# Patient Record
Sex: Female | Born: 1950
Health system: Southern US, Community
[De-identification: ages and names within clinical notes are randomized; demographics above are authoritative.]

## PROBLEM LIST (undated history)

## (undated) DIAGNOSIS — I10 Essential (primary) hypertension: Secondary | ICD-10-CM

## (undated) DIAGNOSIS — E785 Hyperlipidemia, unspecified: Secondary | ICD-10-CM

## (undated) DIAGNOSIS — I2510 Atherosclerotic heart disease of native coronary artery without angina pectoris: Secondary | ICD-10-CM

## (undated) HISTORY — DX: Hyperlipidemia, unspecified: E78.5

## (undated) HISTORY — DX: Atherosclerotic heart disease of native coronary artery without angina pectoris: I25.10

## (undated) HISTORY — PX: GALLBLADDER SURGERY: SHX652

---

## 1999-10-29 ENCOUNTER — Encounter: Admission: RE | Admit: 1999-10-29 | Discharge: 1999-10-29 | Payer: Self-pay | Admitting: *Deleted

## 1999-10-29 ENCOUNTER — Encounter: Payer: Self-pay | Admitting: *Deleted

## 2000-08-12 ENCOUNTER — Encounter: Admission: RE | Admit: 2000-08-12 | Discharge: 2000-08-12 | Payer: Self-pay | Admitting: Obstetrics and Gynecology

## 2000-08-12 ENCOUNTER — Encounter: Payer: Self-pay | Admitting: Obstetrics and Gynecology

## 2001-04-27 ENCOUNTER — Other Ambulatory Visit: Admission: RE | Admit: 2001-04-27 | Discharge: 2001-04-27 | Payer: Self-pay | Admitting: Obstetrics and Gynecology

## 2002-02-14 ENCOUNTER — Encounter: Payer: Self-pay | Admitting: Obstetrics and Gynecology

## 2002-02-14 ENCOUNTER — Encounter: Admission: RE | Admit: 2002-02-14 | Discharge: 2002-02-14 | Payer: Self-pay | Admitting: Obstetrics and Gynecology

## 2003-06-05 ENCOUNTER — Encounter: Admission: RE | Admit: 2003-06-05 | Discharge: 2003-06-05 | Payer: Self-pay | Admitting: Obstetrics and Gynecology

## 2003-06-05 ENCOUNTER — Encounter: Payer: Self-pay | Admitting: Obstetrics and Gynecology

## 2004-02-03 ENCOUNTER — Ambulatory Visit (HOSPITAL_COMMUNITY): Admission: RE | Admit: 2004-02-03 | Discharge: 2004-02-03 | Payer: Self-pay | Admitting: Gastroenterology

## 2004-10-16 ENCOUNTER — Encounter: Admission: RE | Admit: 2004-10-16 | Discharge: 2004-10-16 | Payer: Self-pay | Admitting: Obstetrics and Gynecology

## 2006-03-08 ENCOUNTER — Encounter: Admission: RE | Admit: 2006-03-08 | Discharge: 2006-03-08 | Payer: Self-pay | Admitting: Obstetrics and Gynecology

## 2007-04-14 ENCOUNTER — Encounter: Admission: RE | Admit: 2007-04-14 | Discharge: 2007-04-14 | Payer: Self-pay | Admitting: Obstetrics and Gynecology

## 2008-09-13 ENCOUNTER — Encounter: Admission: RE | Admit: 2008-09-13 | Discharge: 2008-09-13 | Payer: Self-pay | Admitting: Obstetrics and Gynecology

## 2011-01-13 ENCOUNTER — Other Ambulatory Visit: Payer: Self-pay | Admitting: Internal Medicine

## 2011-01-13 DIAGNOSIS — Z1231 Encounter for screening mammogram for malignant neoplasm of breast: Secondary | ICD-10-CM

## 2011-01-15 ENCOUNTER — Ambulatory Visit
Admission: RE | Admit: 2011-01-15 | Discharge: 2011-01-15 | Disposition: A | Discharge status: Home / Self Care | Payer: BC Managed Care – PPO | Source: Ambulatory Visit | Attending: Internal Medicine | Admitting: Internal Medicine

## 2011-01-15 DIAGNOSIS — Z1231 Encounter for screening mammogram for malignant neoplasm of breast: Secondary | ICD-10-CM

## 2011-02-19 NOTE — Op Note (Signed)
[name], [name]                          ACCOUNT NO.:  [id number]   MEDICAL RECORD NO.:  [id number]                   PATIENT TYPE:  AMB   LOCATION:  ENDO                                 FACILITY:  [facility]   PHYSICIAN:  Danise Edge, M.D.                DATE OF BIRTH:  09-Mar-1951   DATE OF PROCEDURE:  02/03/2004  DATE OF DISCHARGE:                                 OPERATIVE REPORT   PROCEDURE:  Screening colonoscopy.   INDICATIONS:  Mrs. Amy Kerr is a 60 year old female, born January 25, 1951.  [name] is scheduled to undergo her first screening colonoscopy  with polypectomy to prevent colon cancer.   ENDOSCOPIST:  Danise Edge, M.D.   PREMEDICATION:  Demerol 60 mg, Versed 8.5 mg.   DESCRIPTION OF PROCEDURE:  After obtaining informed consent, [name] was  placed in the left lateral decubitus position.  I administered intravenous  Demerol and intravenous Versed to achieve conscious sedation for the  procedure.  The patient's blood pressure, oxygen saturation and cardiac  rhythm were monitored throughout the procedure and documented in the medical  record.   Anal inspection and digital rectal exam were normal.  The Olympus adjustable  pediatric colonoscope was introduced into the rectum and advanced to the  cecum.  Colonic preparation for the exam today was excellent.   Rectum:  Normal.  Sigmoid colon and descending colon:  Normal.  Splenic flexure:  Normal.  Transverse colon:  Normal.  Hepatic flexure:  Normal.  Ascending colon:  Normal.  Cecum and ileocecal valve:  Normal.   ASSESSMENT:  Normal screening proctocolonoscopy to the cecum.  No endoscopic  evidence for the presence of colorectal  neoplasia.                                               Danise Edge, M.D.    MJ/MEDQ  D:  02/03/2004  T:  02/03/2004  Job:  [id number]   cc:   Georgann Housekeeper, M.D.  [address]  [place]  [place] [address]  Fax: [phone]

## 2012-08-18 ENCOUNTER — Other Ambulatory Visit: Payer: Self-pay | Admitting: Internal Medicine

## 2012-08-18 DIAGNOSIS — Z1231 Encounter for screening mammogram for malignant neoplasm of breast: Secondary | ICD-10-CM

## 2012-09-29 ENCOUNTER — Ambulatory Visit
Admission: RE | Admit: 2012-09-29 | Discharge: 2012-09-29 | Disposition: A | Discharge status: Home / Self Care | Payer: BC Managed Care – PPO | Source: Ambulatory Visit | Attending: Internal Medicine | Admitting: Internal Medicine

## 2012-09-29 DIAGNOSIS — Z1231 Encounter for screening mammogram for malignant neoplasm of breast: Secondary | ICD-10-CM

## 2013-03-22 ENCOUNTER — Encounter (HOSPITAL_COMMUNITY): Payer: Self-pay | Admitting: Certified Registered Nurse Anesthetist

## 2013-03-22 ENCOUNTER — Encounter (HOSPITAL_COMMUNITY)
Admission: EM | Disposition: A | Discharge status: Home / Self Care | Payer: Self-pay | Source: Home / Self Care | Attending: Emergency Medicine

## 2013-03-22 ENCOUNTER — Emergency Department (HOSPITAL_COMMUNITY): Payer: BC Managed Care – PPO | Admitting: Certified Registered Nurse Anesthetist

## 2013-03-22 ENCOUNTER — Encounter (HOSPITAL_COMMUNITY): Payer: Self-pay | Admitting: Emergency Medicine

## 2013-03-22 ENCOUNTER — Observation Stay (HOSPITAL_COMMUNITY)
Admission: EM | Admit: 2013-03-22 | Discharge: 2013-03-23 | Disposition: A | Discharge status: Home / Self Care | Payer: BC Managed Care – PPO | Attending: General Surgery | Admitting: General Surgery

## 2013-03-22 ENCOUNTER — Emergency Department (HOSPITAL_COMMUNITY): Payer: BC Managed Care – PPO

## 2013-03-22 DIAGNOSIS — R1011 Right upper quadrant pain: Secondary | ICD-10-CM | POA: Insufficient documentation

## 2013-03-22 DIAGNOSIS — I10 Essential (primary) hypertension: Secondary | ICD-10-CM | POA: Insufficient documentation

## 2013-03-22 DIAGNOSIS — K35891 Other acute appendicitis without perforation, with gangrene: Secondary | ICD-10-CM | POA: Diagnosis present

## 2013-03-22 DIAGNOSIS — K3580 Unspecified acute appendicitis: Secondary | ICD-10-CM

## 2013-03-22 HISTORY — DX: Essential (primary) hypertension: I10

## 2013-03-22 HISTORY — PX: LAPAROSCOPIC APPENDECTOMY: SHX408

## 2013-03-22 LAB — CBC WITH DIFFERENTIAL/PLATELET
Basophils Absolute: 0 10*3/uL (ref 0.0–0.1)
Eosinophils Relative: 1 % (ref 0–5)
Lymphocytes Relative: 12 % (ref 12–46)
Lymphs Abs: 1.4 10*3/uL (ref 0.7–4.0)
MCV: 87.4 fL (ref 78.0–100.0)
Neutrophils Relative %: 82 % — ABNORMAL HIGH (ref 43–77)
Platelets: 260 10*3/uL (ref 150–400)
RBC: 4.85 MIL/uL (ref 3.87–5.11)
RDW: 12.8 % (ref 11.5–15.5)
WBC: 12.2 10*3/uL — ABNORMAL HIGH (ref 4.0–10.5)

## 2013-03-22 LAB — URINE MICROSCOPIC-ADD ON

## 2013-03-22 LAB — COMPREHENSIVE METABOLIC PANEL
ALT: 28 U/L (ref 0–35)
AST: 19 U/L (ref 0–37)
Alkaline Phosphatase: 82 U/L (ref 39–117)
CO2: 22 mEq/L (ref 19–32)
Calcium: 9 mg/dL (ref 8.4–10.5)
GFR calc non Af Amer: 62 mL/min — ABNORMAL LOW (ref >90)
Glucose, Bld: 120 mg/dL — ABNORMAL HIGH (ref 70–99)
Potassium: 3.7 mEq/L (ref 3.5–5.1)
Sodium: 138 mEq/L (ref 135–145)
Total Protein: 8 g/dL (ref 6.0–8.3)

## 2013-03-22 LAB — URINALYSIS, ROUTINE W REFLEX MICROSCOPIC
Bilirubin Urine: NEGATIVE
Glucose, UA: NEGATIVE mg/dL
Hgb urine dipstick: NEGATIVE
Specific Gravity, Urine: 1.017 (ref 1.005–1.030)

## 2013-03-22 SURGERY — APPENDECTOMY, LAPAROSCOPIC
Anesthesia: General | Site: Abdomen | Wound class: Contaminated

## 2013-03-22 MED ORDER — PROPOFOL 10 MG/ML IV BOLUS
INTRAVENOUS | Status: DC | PRN
  Administered 2013-03-22: 200 mg via INTRAVENOUS

## 2013-03-22 MED ORDER — NEOSTIGMINE METHYLSULFATE 1 MG/ML IJ SOLN
INTRAMUSCULAR | Status: DC | PRN
  Administered 2013-03-22: 4 mg via INTRAVENOUS

## 2013-03-22 MED ORDER — ONDANSETRON HCL 4 MG/2ML IJ SOLN
4.0000 mg | Freq: Once | INTRAMUSCULAR | Status: AC
  Administered 2013-03-22: 4 mg via INTRAVENOUS
  Filled 2013-03-22: qty 2

## 2013-03-22 MED ORDER — LACTATED RINGERS IV SOLN
INTRAVENOUS | Status: DC | PRN
  Administered 2013-03-22 (×2): via INTRAVENOUS

## 2013-03-22 MED ORDER — GLYCOPYRROLATE 0.2 MG/ML IJ SOLN
INTRAMUSCULAR | Status: DC | PRN
  Administered 2013-03-22: .5 mg via INTRAVENOUS

## 2013-03-22 MED ORDER — METOCLOPRAMIDE HCL 5 MG/ML IJ SOLN
10.0000 mg | Freq: Once | INTRAMUSCULAR | Status: AC
  Administered 2013-03-22: 10 mg via INTRAVENOUS
  Filled 2013-03-22: qty 2

## 2013-03-22 MED ORDER — BUPIVACAINE-EPINEPHRINE PF 0.25-1:200000 % IJ SOLN
INTRAMUSCULAR | Status: AC
  Filled 2013-03-22: qty 30

## 2013-03-22 MED ORDER — EPHEDRINE SULFATE 50 MG/ML IJ SOLN
INTRAMUSCULAR | Status: DC | PRN
  Administered 2013-03-22 (×2): 5 mg via INTRAVENOUS

## 2013-03-22 MED ORDER — MIDAZOLAM HCL 5 MG/5ML IJ SOLN
INTRAMUSCULAR | Status: DC | PRN
  Administered 2013-03-22: 2 mg via INTRAVENOUS

## 2013-03-22 MED ORDER — IOHEXOL 300 MG/ML  SOLN
25.0000 mL | INTRAMUSCULAR | Status: AC
  Administered 2013-03-22 (×2): 25 mL via ORAL

## 2013-03-22 MED ORDER — SODIUM CHLORIDE 0.9 % IR SOLN
Status: DC | PRN
  Administered 2013-03-22: 1

## 2013-03-22 MED ORDER — MORPHINE SULFATE 4 MG/ML IJ SOLN
6.0000 mg | Freq: Once | INTRAMUSCULAR | Status: AC
  Administered 2013-03-22: 6 mg via INTRAVENOUS
  Filled 2013-03-22: qty 2

## 2013-03-22 MED ORDER — LIDOCAINE HCL 4 % MT SOLN
OROMUCOSAL | Status: DC | PRN
  Administered 2013-03-22: 4 mL via TOPICAL

## 2013-03-22 MED ORDER — PHENYLEPHRINE HCL 10 MG/ML IJ SOLN
INTRAMUSCULAR | Status: DC | PRN
  Administered 2013-03-22 (×2): 100 ug via INTRAVENOUS

## 2013-03-22 MED ORDER — FENTANYL CITRATE 0.05 MG/ML IJ SOLN
INTRAMUSCULAR | Status: DC | PRN
  Administered 2013-03-22: 50 ug via INTRAVENOUS
  Administered 2013-03-22: 100 ug via INTRAVENOUS
  Administered 2013-03-22 (×2): 50 ug via INTRAVENOUS

## 2013-03-22 MED ORDER — DEXAMETHASONE SODIUM PHOSPHATE 4 MG/ML IJ SOLN
INTRAMUSCULAR | Status: DC | PRN
  Administered 2013-03-22: 8 mg via INTRAVENOUS

## 2013-03-22 MED ORDER — ROCURONIUM BROMIDE 100 MG/10ML IV SOLN
INTRAVENOUS | Status: DC | PRN
  Administered 2013-03-22 (×2): 25 mg via INTRAVENOUS

## 2013-03-22 MED ORDER — SODIUM CHLORIDE 0.9 % IR SOLN
Status: DC | PRN
  Administered 2013-03-22: 1000 mL

## 2013-03-22 MED ORDER — HYDROMORPHONE HCL PF 1 MG/ML IJ SOLN
0.5000 mg | INTRAMUSCULAR | Status: DC | PRN
  Administered 2013-03-22: 1 mg via INTRAVENOUS
  Filled 2013-03-22: qty 1

## 2013-03-22 MED ORDER — BUPIVACAINE-EPINEPHRINE 0.25% -1:200000 IJ SOLN
INTRAMUSCULAR | Status: DC | PRN
  Administered 2013-03-22: 30 mL

## 2013-03-22 MED ORDER — LIDOCAINE HCL (CARDIAC) 20 MG/ML IV SOLN
INTRAVENOUS | Status: DC | PRN
  Administered 2013-03-22: 100 mg via INTRAVENOUS

## 2013-03-22 MED ORDER — IOHEXOL 300 MG/ML  SOLN
100.0000 mL | Freq: Once | INTRAMUSCULAR | Status: AC | PRN
  Administered 2013-03-22: 100 mL via INTRAVENOUS

## 2013-03-22 MED ORDER — SODIUM CHLORIDE 0.9 % IV SOLN
3.0000 g | INTRAVENOUS | Status: AC
  Administered 2013-03-22: 3 g via INTRAVENOUS
  Filled 2013-03-22: qty 3

## 2013-03-22 MED ORDER — SUCCINYLCHOLINE CHLORIDE 20 MG/ML IJ SOLN
INTRAMUSCULAR | Status: DC | PRN
  Administered 2013-03-22: 100 mg via INTRAVENOUS

## 2013-03-22 SURGICAL SUPPLY — 49 items
ADH SKN CLS APL DERMABOND .7 (GAUZE/BANDAGES/DRESSINGS) ×1
APPLIER CLIP ROT 10 11.4 M/L (STAPLE)
APR CLP MED LRG 11.4X10 (STAPLE)
BAG SPEC RTRVL LRG 6X4 10 (ENDOMECHANICALS) ×1
BLADE SURG ROTATE 9660 (MISCELLANEOUS) ×1 IMPLANT
CANISTER SUCTION 2500CC (MISCELLANEOUS) ×2 IMPLANT
CHLORAPREP W/TINT 26ML (MISCELLANEOUS) ×3 IMPLANT
CLIP APPLIE ROT 10 11.4 M/L (STAPLE) IMPLANT
CLOTH BEACON ORANGE TIMEOUT ST (SAFETY) ×2 IMPLANT
COVER SURGICAL LIGHT HANDLE (MISCELLANEOUS) ×2 IMPLANT
CUTTER LINEAR ENDO 35 ETS (STAPLE) ×1 IMPLANT
CUTTER LINEAR ENDO 35 ETS TH (STAPLE) IMPLANT
DECANTER SPIKE VIAL GLASS SM (MISCELLANEOUS) ×1 IMPLANT
DERMABOND ADVANCED (GAUZE/BANDAGES/DRESSINGS) ×1
DERMABOND ADVANCED .7 DNX12 (GAUZE/BANDAGES/DRESSINGS) ×1 IMPLANT
DRAPE UTILITY 15X26 W/TAPE STR (DRAPE) ×4 IMPLANT
ELECT REM PT RETURN 9FT ADLT (ELECTROSURGICAL) ×2
ELECTRODE REM PT RTRN 9FT ADLT (ELECTROSURGICAL) ×1 IMPLANT
ENDOLOOP SUT PDS II  0 18 (SUTURE)
ENDOLOOP SUT PDS II 0 18 (SUTURE) IMPLANT
GLOVE BIOGEL PI IND STRL 7.5 (GLOVE) IMPLANT
GLOVE BIOGEL PI IND STRL 8 (GLOVE) ×1 IMPLANT
GLOVE BIOGEL PI INDICATOR 7.5 (GLOVE) ×1
GLOVE BIOGEL PI INDICATOR 8 (GLOVE) ×1
GLOVE ECLIPSE 7.5 STRL STRAW (GLOVE) ×2 IMPLANT
GLOVE SURG SS PI 7.5 STRL IVOR (GLOVE) ×1 IMPLANT
GOWN PREVENTION PLUS XXLARGE (GOWN DISPOSABLE) ×1 IMPLANT
GOWN STRL NON-REIN LRG LVL3 (GOWN DISPOSABLE) ×4 IMPLANT
GOWN STRL REIN XL XLG (GOWN DISPOSABLE) ×1 IMPLANT
KIT BASIN OR (CUSTOM PROCEDURE TRAY) ×2 IMPLANT
KIT ROOM TURNOVER OR (KITS) ×2 IMPLANT
NS IRRIG 1000ML POUR BTL (IV SOLUTION) ×2 IMPLANT
PAD ARMBOARD 7.5X6 YLW CONV (MISCELLANEOUS) ×4 IMPLANT
PENCIL BUTTON HOLSTER BLD 10FT (ELECTRODE) IMPLANT
POUCH SPECIMEN RETRIEVAL 10MM (ENDOMECHANICALS) ×2 IMPLANT
RELOAD /EVU35 (ENDOMECHANICALS) IMPLANT
RELOAD CUTTER ETS 35MM STAND (ENDOMECHANICALS) IMPLANT
SCALPEL HARMONIC ACE (MISCELLANEOUS) ×1 IMPLANT
SET IRRIG TUBING LAPAROSCOPIC (IRRIGATION / IRRIGATOR) ×2 IMPLANT
SPECIMEN JAR SMALL (MISCELLANEOUS) ×2 IMPLANT
SUT MNCRL AB 4-0 PS2 18 (SUTURE) ×2 IMPLANT
TOWEL OR 17X24 6PK STRL BLUE (TOWEL DISPOSABLE) ×2 IMPLANT
TOWEL OR 17X26 10 PK STRL BLUE (TOWEL DISPOSABLE) ×2 IMPLANT
TRAY FOLEY CATH 14FR (SET/KITS/TRAYS/PACK) ×2 IMPLANT
TRAY LAPAROSCOPIC (CUSTOM PROCEDURE TRAY) ×2 IMPLANT
TROCAR XCEL 12X100 BLDLESS (ENDOMECHANICALS) ×2 IMPLANT
TROCAR XCEL BLUNT TIP 100MML (ENDOMECHANICALS) ×2 IMPLANT
TROCAR XCEL NON-BLD 5MMX100MML (ENDOMECHANICALS) ×2 IMPLANT
WATER STERILE IRR 1000ML POUR (IV SOLUTION) IMPLANT

## 2013-03-22 NOTE — ED Notes (Signed)
Rt sided abd pain that came on suddenly this am  Denies dysuria  Has nausea. Last bm this am good

## 2013-03-22 NOTE — H&P (Signed)
Amy Kerr is an 62 y.o. female.   Chief Complaint: Abdominal pain HPI: Started having abdominal pain about 10 AM today associated with nausea and anorexia.  Had a normal bowel movement today which did not improve her pain.  Came to ED later in the day because her pain did not improve.  CT done showed likely early acute appendicitis.  Surgery consultation requested.  Past Medical History  Diagnosis Date  . Hypertension     No past surgical history on file.  No family history on file. Social History:  has no tobacco, alcohol, and drug history on file.  Allergies: No Known Allergies   (Not in a hospital admission)  Results for orders placed during the hospital encounter of 03/22/13 (from the past 48 hour(s))  CBC WITH DIFFERENTIAL     Status: Abnormal   Collection Time    03/22/13  2:30 PM      Result Value Range   WBC 12.2 (*) 4.0 - 10.5 K/uL   RBC 4.85  3.87 - 5.11 MIL/uL   Hemoglobin 14.6  12.0 - 15.0 g/dL   HCT 16.1  09.6 - 04.5 %   MCV 87.4  78.0 - 100.0 fL   MCH 30.1  26.0 - 34.0 pg   MCHC 34.4  30.0 - 36.0 g/dL   RDW 40.9  81.1 - 91.4 %   Platelets 260  150 - 400 K/uL   Neutrophils Relative % 82 (*) 43 - 77 %   Neutro Abs 9.9 (*) 1.7 - 7.7 K/uL   Lymphocytes Relative 12  12 - 46 %   Lymphs Abs 1.4  0.7 - 4.0 K/uL   Monocytes Relative 5  3 - 12 %   Monocytes Absolute 0.7  0.1 - 1.0 K/uL   Eosinophils Relative 1  0 - 5 %   Eosinophils Absolute 0.1  0.0 - 0.7 K/uL   Basophils Relative 0  0 - 1 %   Basophils Absolute 0.0  0.0 - 0.1 K/uL  COMPREHENSIVE METABOLIC PANEL     Status: Abnormal   Collection Time    03/22/13  2:30 PM      Result Value Range   Sodium 138  135 - 145 mEq/L   Potassium 3.7  3.5 - 5.1 mEq/L   Chloride 103  96 - 112 mEq/L   CO2 22  19 - 32 mEq/L   Glucose, Bld 120 (*) 70 - 99 mg/dL   BUN 12  6 - 23 mg/dL   Creatinine, Ser 7.82  0.50 - 1.10 mg/dL   Calcium 9.0  8.4 - 95.6 mg/dL   Total Protein 8.0  6.0 - 8.3 g/dL   Albumin 4.3  3.5 - 5.2  g/dL   AST 19  0 - 37 U/L   ALT 28  0 - 35 U/L   Alkaline Phosphatase 82  39 - 117 U/L   Total Bilirubin 0.7  0.3 - 1.2 mg/dL   GFR calc non Af Amer 62 (*) >90 mL/min   GFR calc Af Amer 72 (*) >90 mL/min   Comment:            The eGFR has been calculated     using the CKD EPI equation.     This calculation has not been     validated in all clinical     situations.     eGFR's persistently     <90 mL/min signify     possible Chronic Kidney Disease.  LIPASE, BLOOD  Status: None   Collection Time    03/22/13  2:30 PM      Result Value Range   Lipase 24  11 - 59 U/L  URINALYSIS, ROUTINE W REFLEX MICROSCOPIC     Status: Abnormal   Collection Time    03/22/13  2:30 PM      Result Value Range   Color, Urine YELLOW  YELLOW   APPearance CLEAR  CLEAR   Specific Gravity, Urine 1.017  1.005 - 1.030   pH 5.0  5.0 - 8.0   Glucose, UA NEGATIVE  NEGATIVE mg/dL   Hgb urine dipstick NEGATIVE  NEGATIVE   Bilirubin Urine NEGATIVE  NEGATIVE   Ketones, ur NEGATIVE  NEGATIVE mg/dL   Protein, ur NEGATIVE  NEGATIVE mg/dL   Urobilinogen, UA 0.2  0.0 - 1.0 mg/dL   Nitrite NEGATIVE  NEGATIVE   Leukocytes, UA TRACE (*) NEGATIVE  URINE MICROSCOPIC-ADD ON     Status: Abnormal   Collection Time    03/22/13  2:30 PM      Result Value Range   Squamous Epithelial / LPF RARE  RARE   WBC, UA 0-2  <3 WBC/hpf   RBC / HPF 0-2  <3 RBC/hpf   Bacteria, UA FEW (*) RARE   Casts HYALINE CASTS (*) NEGATIVE   Urine-Other MUCOUS PRESENT     Comment: LESS THAN 10 mL OF URINE SUBMITTED  OCCULT BLOOD, POC DEVICE     Status: None   Collection Time    03/22/13  3:51 PM      Result Value Range   Fecal Occult Bld NEGATIVE  NEGATIVE   Ct Abdomen Pelvis W Contrast  03/22/2013   *RADIOLOGY REPORT*  Clinical Data: Abdominal pain  CT ABDOMEN AND PELVIS WITH CONTRAST  Technique:  Multidetector CT imaging of the abdomen and pelvis was performed following the standard protocol during bolus administration of intravenous  contrast.  Contrast: OMNIPAQUE IOHEXOL 300 MG/ML  SOLN  Comparison: None.  Findings: There is streaky atelectasis at both lung bases, in the lower lobes.  Visualized portion of the heart is normal in size.  There is mild diffuse fatty infiltration of the liver.  No focal hepatic lesion or biliary ductal dilatation.  The gallbladder, pancreas, common bile duct, spleen, adrenal glands, are within normal limits.  Suggestion of slight cortical atrophy of the right kidney compared to the left.  There are benign parapelvic cysts in the left kidney.  Negative for hydronephrosis.  Both ureters are normal in caliber.  The urinary bladder, uterus, and adnexa are within normal limits.  Stomach is decompressed and unremarkable.  Small bowel loops normal in caliber.  Moderate diverticulosis of the colon, predominately in the sigmoid region, with preserved normal pericolonic fat.  The cecum is positioned in the right lower quadrant near the level of the iliac crest.  The appendix courses inferiorly and anteriorly from the cecum and measures up to 8.3 mm in diameter and the wall appears thickened.  No contrast or gas is seen within the lumen of the appendix.  There is some periappendiceal fat stranding (image #[id number]).  Negative for ascites, abscess, or free air.  Abdominal aorta is normal in caliber, and contains scattered atherosclerotic calcification.  Vertebral bodies normal in height and alignment.  Moderate sclerosis about the right sacroiliac joint, with associated bony erosion noted.  Mild sclerosis of the left sacroiliac joint.  IMPRESSION:  1.  CT findings suggestive of early acute appendicitis.  Findings were discussed with Greta Doom  Tram, PA, by telephone 03/22/2013 at 6:25 p.m. Reportedly, the patient does have right lower quadrant pain. 2.  Mild diffuse fatty infiltration of the liver. 3.  Colonic diverticulosis, uncomplicated. 4.  Bilateral sacroiliitis, right worse than left.   Original Report Authenticated By: Britta Mccreedy, M.D.    Review of Systems  Constitutional: Negative for fever and chills.  HENT: Negative.   Eyes: Negative.   Respiratory: Negative.   Cardiovascular: Negative.   Gastrointestinal: Positive for nausea and abdominal pain.  Genitourinary: Negative.   Musculoskeletal: Negative.   Skin: Negative.   Neurological: Negative.   Endo/Heme/Allergies: Negative.   Psychiatric/Behavioral: Negative.     Blood pressure 168/87, pulse 94, temperature 98.1 F (36.7 C), resp. rate 16, height 5\' 4"  (1.626 m), weight 81.647 kg (180 lb), SpO2 98.00%. Physical Exam  Constitutional: She is oriented to person, place, and time. She appears well-developed and well-nourished.  Obese  HENT:  Head: Normocephalic and atraumatic.  Eyes: Conjunctivae and EOM are normal. Pupils are equal, round, and reactive to light.  Neck: Normal carotid pulses present. Carotid bruit is not present.  Cardiovascular: Normal rate, regular rhythm and normal heart sounds.   Pulses:      Carotid pulses are 2+ on the right side, and 2+ on the left side.      Radial pulses are 2+ on the right side, and 2+ on the left side.       Femoral pulses are 2+ on the right side, and 2+ on the left side.      Popliteal pulses are 2+ on the right side, and 2+ on the left side.       Dorsalis pedis pulses are 2+ on the right side, and 2+ on the left side.       Posterior tibial pulses are 2+ on the right side, and 2+ on the left side.  Respiratory: Effort normal and breath sounds normal.  GI: Soft. Normal appearance. There is tenderness in the right lower quadrant. There is rebound, guarding and tenderness at McBurney's point.  Musculoskeletal: Normal range of motion.  Neurological: She is alert and oriented to person, place, and time. She has normal reflexes.  Skin: Skin is warm. She is diaphoretic.  Psychiatric: She has a normal mood and affect. Her behavior is normal. Judgment and thought content normal.      Assessment/Plan Acute appendicitis based on clinical evaluation and radiological studies.  Unasyn on call to the OR. Does not appear to be perforated. Laparoscopic appendectomy.  Cherylynn Ridges 03/22/2013, 8:07 PM

## 2013-03-22 NOTE — ED Provider Notes (Signed)
History     CSN: [id number]  Arrival date & time 03/22/13  1412   First MD Initiated Contact with Patient 03/22/13 1524      Chief Complaint  Patient presents with  . Abdominal Pain    (Consider location/radiation/quality/duration/timing/severity/associated sxs/prior treatment) HPI  62 year old female with history of hypertension presents complaining of abdominal pain. Patient reports acute onset of pain to the right lower quadrant abdomen which started about 4 hours ago. Pain is progressively worse, nonradiating, worsening with movement. Does associate nausea without vomiting or diarrhea. No complaint of fever, chills, chest pain, shortness of breath, back pain, dysuria, hematuria, hematochezia, or melena. No urinary complaints. Denies any recent trauma. No specific treatment tried. No prior abdominal surgery. Pain is currently rated as an 8/10, 10/10 with movement or palpation.  Appendix is intact.  Last BM this AM, normal.    Past Medical History  Diagnosis Date  . Hypertension     No past surgical history on file.  No family history on file.  History  Substance Use Topics  . Smoking status: Not on file  . Smokeless tobacco: Not on file  . Alcohol Use: Not on file    OB History   Grav Para Term Preterm Abortions TAB SAB Ect Mult Living                  Review of Systems  All other systems reviewed and are negative.    Allergies  Review of patient's allergies indicates no known allergies.  Home Medications  No current outpatient prescriptions on file.  BP 191/92  Pulse 85  Temp(Src) 98.1 F (36.7 C)  Resp 16  SpO2 99%  Physical Exam  Nursing note and vitals reviewed. Constitutional: She is oriented to person, place, and time. She appears well-developed and well-nourished. No distress.  Awake, alert, nontoxic appearance  HENT:  Head: Atraumatic.  Eyes: Conjunctivae are normal. Right eye exhibits no discharge. Left eye exhibits no discharge.  Neck:  Neck supple.  Cardiovascular: Normal rate and regular rhythm.   Pulmonary/Chest: Effort normal. No respiratory distress. She exhibits no tenderness.  Abdominal: Soft. There is tenderness (Right lower quadrant tenderness on palpation with guarding. Positive Rovsing sign, Negative Murphy's sign, positive McBurney's point.  Positive psoas sign.  no hernia.  no rebound tenderness). There is guarding. There is no rebound.  Genitourinary:  Chaperone present:  Normal rectal tone, no mass, no frank blood, hemoccult neg.    No CVA tenderness  Musculoskeletal: She exhibits no tenderness.  ROM appears intact, no obvious focal weakness  Neurological: She is alert and oriented to person, place, and time.  Mental status and motor strength appears intact  Skin: No rash noted.  Psychiatric: She has a normal mood and affect.    ED Course  Procedures (including critical care time)  Pt with RLQ abd pain concerning for appendicitis.  Work up initiated.  DDx appendicitis, colitis, diverticulitis, abd abscess, uti, kidney stone, MSK.    7:07 PM Pt has elevated WBC of 12.2, CT shows early appendicitis without rupture or abscess.  I have consulted General Surgery Dr. Lindie Spruce, who agrees to see pt in ER and will admit for further care  Pt made NPO.  Pain medication offered, pt declined.  Pt is afebrile, hemodynamically stable.  Care discussed with attending.    Labs Reviewed  CBC WITH DIFFERENTIAL - Abnormal; Notable for the following:    WBC 12.2 (*)    Neutrophils Relative % 82 (*)  Neutro Abs 9.9 (*)    All other components within normal limits  COMPREHENSIVE METABOLIC PANEL - Abnormal; Notable for the following:    Glucose, Bld 120 (*)    GFR calc non Af Amer 62 (*)    GFR calc Af Amer 72 (*)    All other components within normal limits  URINALYSIS, ROUTINE W REFLEX MICROSCOPIC - Abnormal; Notable for the following:    Leukocytes, UA TRACE (*)    All other components within normal limits  URINE  MICROSCOPIC-ADD ON - Abnormal; Notable for the following:    Bacteria, UA FEW (*)    Casts HYALINE CASTS (*)    All other components within normal limits  LIPASE, BLOOD  OCCULT BLOOD X 1 CARD TO LAB, STOOL  OCCULT BLOOD, POC DEVICE   Ct Abdomen Pelvis W Contrast  03/22/2013   *RADIOLOGY REPORT*  Clinical Data: Abdominal pain  CT ABDOMEN AND PELVIS WITH CONTRAST  Technique:  Multidetector CT imaging of the abdomen and pelvis was performed following the standard protocol during bolus administration of intravenous contrast.  Contrast: OMNIPAQUE IOHEXOL 300 MG/ML  SOLN  Comparison: None.  Findings: There is streaky atelectasis at both lung bases, in the lower lobes.  Visualized portion of the heart is normal in size.  There is mild diffuse fatty infiltration of the liver.  No focal hepatic lesion or biliary ductal dilatation.  The gallbladder, pancreas, common bile duct, spleen, adrenal glands, are within normal limits.  Suggestion of slight cortical atrophy of the right kidney compared to the left.  There are benign parapelvic cysts in the left kidney.  Negative for hydronephrosis.  Both ureters are normal in caliber.  The urinary bladder, uterus, and adnexa are within normal limits.  Stomach is decompressed and unremarkable.  Small bowel loops normal in caliber.  Moderate diverticulosis of the colon, predominately in the sigmoid region, with preserved normal pericolonic fat.  The cecum is positioned in the right lower quadrant near the level of the iliac crest.  The appendix courses inferiorly and anteriorly from the cecum and measures up to 8.3 mm in diameter and the wall appears thickened.  No contrast or gas is seen within the lumen of the appendix.  There is some periappendiceal fat stranding (image #[id number]).  Negative for ascites, abscess, or free air.  Abdominal aorta is normal in caliber, and contains scattered atherosclerotic calcification.  Vertebral bodies normal in height and alignment.  Moderate  sclerosis about the right sacroiliac joint, with associated bony erosion noted.  Mild sclerosis of the left sacroiliac joint.  IMPRESSION:  1.  CT findings suggestive of early acute appendicitis.  Findings were discussed with Marlena Clipper, PA, by telephone 03/22/2013 at 6:25 p.m. Reportedly, the patient does have right lower quadrant pain. 2.  Mild diffuse fatty infiltration of the liver. 3.  Colonic diverticulosis, uncomplicated. 4.  Bilateral sacroiliitis, right worse than left.   Original Report Authenticated By: Britta Mccreedy, M.D.     1. Acute appendicitis       MDM  BP 168/87  Pulse 94  Temp(Src) 98.1 F (36.7 C)  Resp 16  Ht 5\' 4"  (1.626 m)  Wt 180 lb (81.647 kg)  BMI 30.88 kg/m2  SpO2 98%  I have reviewed nursing notes and vital signs. I personally reviewed the imaging tests through PACS system  I reviewed available ER/hospitalization records thought the EMR         Fayrene Helper, [place] 03/22/13 1911

## 2013-03-22 NOTE — Anesthesia Procedure Notes (Signed)
Procedure Name: Intubation Date/Time: 03/22/2013 10:15 PM Performed by: Kirston Luty S Pre-anesthesia Checklist: Patient identified, Timeout performed, Emergency Drugs available, Suction available and Patient being monitored Patient Re-evaluated:Patient Re-evaluated prior to inductionOxygen Delivery Method: Circle system utilized Preoxygenation: Pre-oxygenation with 100% oxygen Intubation Type: IV induction and Rapid sequence Ventilation: Mask ventilation without difficulty Laryngoscope Size: Mac and 3 Grade View: Grade I Tube type: Oral Tube size: 7.5 mm Number of attempts: 1 Airway Equipment and Method: Stylet Placement Confirmation: ETT inserted through vocal cords under direct vision,  positive ETCO2 and breath sounds checked- equal and bilateral Secured at: 22 cm Tube secured with: Tape Dental Injury: Teeth and Oropharynx as per pre-operative assessment

## 2013-03-22 NOTE — Op Note (Signed)
Amy Kerr May 08, 1951 [id number] 03/22/2013  Preoperative diagnosis: acute appendicitis  Postoperative diagnosis: same  Procedure: laparoscopic appendectomy  Surgeon: Currie Paris, MD, FACS   Anesthesia: General   Clinical History and Indications: this patient presented with a fairly classic history and physical examination for acute appendicitis. Appendectomy was recommended the patient agreed.initially, Dr Lindie Spruce was going to do the surgery. However, he was tied up with emergency same emergency department and I spoke with the family and they agreed for me to proceed with the surgery.the patient was in the operating room and asleep at this point.     Description of Procedure: after satisfactory general endotracheal anesthesia had been obtained the abdomen was prepped and draped and the time out was done. 0.25 Marcaine with epinephrine was used for each incision. An umbilical incision was made, the fascia identified and opened, and the peritoneal cavity entered under direct vision. A pursestring was placed and the Hassan cannula was introduced. The abdomen was insufflated to 15. Under direct vision a 5 mm trocar was placed in the right upper quadrant and a 5 mm trocar in the left lower quadrant.  The camera was placed in the left lower quadrant trocar. The appendix is identified and was gangrenous but not perforated. I was able to retract it anteriorly and using the harmonic divided the mesentery down to the base. Was done I was able to place the Endo GIA stapler in and divide the appendix at the base. The appendix was placed in a bag and brought out the umbilical site. The abdomen was reinsufflated and check made for hemostasis. There was just a little bit of cloudy fluid around the appendix which was suctioned out there was no frank purulent fluid present.  Small trochars were removed under direct vision. The umbilical trocar is removed and the abdomen deflated and the fascia closed  with the pursestring previously placed. Incision was closed with 4-0 Monocryl subcuticular.  The patient tolerated the procedure well. There were no operative complications. Counts were correct. Blood loss was minimal.   Currie Paris, MD, FACS 03/22/2013 11:50 PM

## 2013-03-22 NOTE — Preoperative (Signed)
Beta Blockers   Reason not to administer Beta Blockers:Not Applicable 

## 2013-03-22 NOTE — ED Provider Notes (Signed)
  This was a shared visit with a mid-level provided (NP or PA).  Throughout the patient's course I was available for consultation/collaboration.  I saw the ECG (if appropriate), relevant labs and studies - I agree with the interpretation.  On my exam the patient was in no distress.  She did, however, have right lower quadrant pain, tenderness to palpation.  She was hemodynamically stable. CT demonstrates concern for appendicitis.  Patient admitted for further evaluation and management.      Gerhard Munch, MD 03/22/13 1930

## 2013-03-22 NOTE — Anesthesia Preprocedure Evaluation (Addendum)
Anesthesia Evaluation  Patient identified by MRN, date of birth, ID band Patient awake    Reviewed: Allergy & Precautions, H&P , NPO status , Patient's Chart, lab work & pertinent test results  Airway Mallampati: II TM Distance: >3 FB Neck ROM: full    Dental  (+) Teeth Intact and Dental Advidsory Given   Pulmonary neg pulmonary ROS,          Cardiovascular hypertension, On Medications     Neuro/Psych    GI/Hepatic Neg liver ROS,   Endo/Other  negative endocrine ROS  Renal/GU negative Renal ROS     Musculoskeletal   Abdominal   Peds  Hematology   Anesthesia Other Findings   Reproductive/Obstetrics                          Anesthesia Physical Anesthesia Plan  ASA: II and emergent  Anesthesia Plan: General   Post-op Pain Management:    Induction: Intravenous  Airway Management Planned: Oral ETT  Additional Equipment:   Intra-op Plan:   Post-operative Plan: Extubation in OR  Informed Consent: I have reviewed the patients History and Physical, chart, labs and discussed the procedure including the risks, benefits and alternatives for the proposed anesthesia with the patient or authorized representative who has indicated his/her understanding and acceptance.     Plan Discussed with: CRNA, Anesthesiologist and Surgeon  Anesthesia Plan Comments:        Anesthesia Quick Evaluation

## 2013-03-23 ENCOUNTER — Encounter (HOSPITAL_COMMUNITY): Payer: Self-pay

## 2013-03-23 DIAGNOSIS — K35891 Other acute appendicitis without perforation, with gangrene: Secondary | ICD-10-CM | POA: Diagnosis present

## 2013-03-23 MED ORDER — KCL IN DEXTROSE-NACL 20-5-0.45 MEQ/L-%-% IV SOLN
INTRAVENOUS | Status: DC
  Administered 2013-03-23: 02:00:00 via INTRAVENOUS
  Filled 2013-03-23 (×3): qty 1000

## 2013-03-23 MED ORDER — OXYCODONE-ACETAMINOPHEN 5-325 MG PO TABS: 1.0000 | ORAL_TABLET | ORAL | Status: DC | PRN

## 2013-03-23 MED ORDER — HYDROMORPHONE HCL PF 1 MG/ML IJ SOLN: 2.0000 mg | INTRAMUSCULAR | Status: DC | PRN

## 2013-03-23 MED ORDER — BIOTENE DRY MOUTH MT LIQD
15.0000 mL | Freq: Two times a day (BID) | OROMUCOSAL | Status: DC
  Administered 2013-03-23: 15 mL via OROMUCOSAL

## 2013-03-23 MED ORDER — HEPARIN SODIUM (PORCINE) 5000 UNIT/ML IJ SOLN
5000.0000 [IU] | Freq: Three times a day (TID) | INTRAMUSCULAR | Status: DC
  Administered 2013-03-23: 5000 [IU] via SUBCUTANEOUS
  Filled 2013-03-23 (×4): qty 1

## 2013-03-23 MED ORDER — ONDANSETRON HCL 4 MG/2ML IJ SOLN: 4.0000 mg | Freq: Four times a day (QID) | INTRAMUSCULAR | Status: DC | PRN

## 2013-03-23 MED ORDER — OXYCODONE-ACETAMINOPHEN 5-325 MG PO TABS: 1.0000 | ORAL_TABLET | Freq: Four times a day (QID) | ORAL | Status: DC | PRN

## 2013-03-23 MED ORDER — HYDROMORPHONE HCL PF 1 MG/ML IJ SOLN: 0.2500 mg | INTRAMUSCULAR | Status: DC | PRN

## 2013-03-23 MED ORDER — ONDANSETRON HCL 4 MG PO TABS: 4.0000 mg | ORAL_TABLET | Freq: Four times a day (QID) | ORAL | Status: DC | PRN

## 2013-03-23 MED ORDER — ONDANSETRON HCL 4 MG/2ML IJ SOLN: 4.0000 mg | Freq: Once | INTRAMUSCULAR | Status: DC | PRN

## 2013-03-23 NOTE — Progress Notes (Signed)
1 Day Post-Op   Assessment: s/p * No procedures listed * Patient Active Problem List   Diagnosis Date Noted  . Acute gangrenous appendicitis 03/23/2013    Priority: High    Doing well post appendectomy  Plan: Advance diet May be able to discharge later today  Subjective: Feels much better than pre-op - pain and nausea resolved, took some liquids and crackers this am  Objective: Vital signs in last 24 hours: Temp:  [97.9 F (36.6 C)-98.1 F (36.7 C)] 98.1 F (36.7 C) (06/20 0547) Pulse Rate:  [85-106] 104 (06/20 0547) Resp:  [16-20] 20 (06/20 0547) BP: (119-192)/(53-98) 124/61 mmHg (06/20 0547) SpO2:  [90 %-100 %] 95 % (06/20 0547) Weight:  [180 lb (81.647 kg)-199 lb 1.6 oz (90.311 kg)] 199 lb 1.6 oz (90.311 kg) (06/20 0100)   Intake/Output from previous day: 06/19 0701 - 06/20 0700 In: 1870 [P.O.:120; I.V.:1750] Out: 175 [Urine:150; Blood:25]  General appearance: alert, cooperative and no distress Resp: clear to auscultation bilaterally GI: Soft, not tender except incision, BS + not distended  Incision: healing well  Lab Results:   Recent Labs  03/22/13 1430  WBC 12.2*  HGB 14.6  HCT 42.4  PLT 260   BMET  Recent Labs  03/22/13 1430  NA 138  K 3.7  CL 103  CO2 22  GLUCOSE 120*  BUN 12  CREATININE 0.96  CALCIUM 9.0    MEDS, Scheduled . antiseptic oral rinse  15 mL Mouth Rinse BID  . heparin subcutaneous  5,000 Units Subcutaneous Q8H    Studies/Results: Ct Abdomen Pelvis W Contrast  03/22/2013   *RADIOLOGY REPORT*  Clinical Data: Abdominal pain  CT ABDOMEN AND PELVIS WITH CONTRAST  Technique:  Multidetector CT imaging of the abdomen and pelvis was performed following the standard protocol during bolus administration of intravenous contrast.  Contrast: OMNIPAQUE IOHEXOL 300 MG/ML  SOLN  Comparison: None.  Findings: There is streaky atelectasis at both lung bases, in the lower lobes.  Visualized portion of the heart is normal in size.  There  is mild diffuse fatty infiltration of the liver.  No focal hepatic lesion or biliary ductal dilatation.  The gallbladder, pancreas, common bile duct, spleen, adrenal glands, are within normal limits.  Suggestion of slight cortical atrophy of the right kidney compared to the left.  There are benign parapelvic cysts in the left kidney.  Negative for hydronephrosis.  Both ureters are normal in caliber.  The urinary bladder, uterus, and adnexa are within normal limits.  Stomach is decompressed and unremarkable.  Small bowel loops normal in caliber.  Moderate diverticulosis of the colon, predominately in the sigmoid region, with preserved normal pericolonic fat.  The cecum is positioned in the right lower quadrant near the level of the iliac crest.  The appendix courses inferiorly and anteriorly from the cecum and measures up to 8.3 mm in diameter and the wall appears thickened.  No contrast or gas is seen within the lumen of the appendix.  There is some periappendiceal fat stranding (image #[id number]).  Negative for ascites, abscess, or free air.  Abdominal aorta is normal in caliber, and contains scattered atherosclerotic calcification.  Vertebral bodies normal in height and alignment.  Moderate sclerosis about the right sacroiliac joint, with associated bony erosion noted.  Mild sclerosis of the left sacroiliac joint.  IMPRESSION:  1.  CT findings suggestive of early acute appendicitis.  Findings were discussed with Marlena Clipper, PA, by telephone 03/22/2013 at 6:25 p.m. Reportedly, the patient does  have right lower quadrant pain. 2.  Mild diffuse fatty infiltration of the liver. 3.  Colonic diverticulosis, uncomplicated. 4.  Bilateral sacroiliitis, right worse than left.   Original Report Authenticated By: Britta Mccreedy, M.D.      LOS: 1 day     Currie Paris, MD, [facility], [place] [phone]   03/23/2013 7:32 AM

## 2013-03-23 NOTE — Discharge Summary (Signed)
Physician Discharge Summary  Patient ID: Amy Kerr MRN: [id number] DOB/AGE: 1950/12/10 62 y.o.  Admit date: 03/22/2013 Discharge date: 03/23/2013  Admitting Diagnosis: Abdominal pain  Discharge Diagnosis Patient Active Problem List   Diagnosis Date Noted  . Acute gangrenous appendicitis 03/23/2013    Consultants none  Imaging: Ct Abdomen Pelvis W Contrast  03/22/2013   *RADIOLOGY REPORT*  Clinical Data: Abdominal pain  CT ABDOMEN AND PELVIS WITH CONTRAST  Technique:  Multidetector CT imaging of the abdomen and pelvis was performed following the standard protocol during bolus administration of intravenous contrast.  Contrast: OMNIPAQUE IOHEXOL 300 MG/ML  SOLN  Comparison: None.  Findings: There is streaky atelectasis at both lung bases, in the lower lobes.  Visualized portion of the heart is normal in size.  There is mild diffuse fatty infiltration of the liver.  No focal hepatic lesion or biliary ductal dilatation.  The gallbladder, pancreas, common bile duct, spleen, adrenal glands, are within normal limits.  Suggestion of slight cortical atrophy of the right kidney compared to the left.  There are benign parapelvic cysts in the left kidney.  Negative for hydronephrosis.  Both ureters are normal in caliber.  The urinary bladder, uterus, and adnexa are within normal limits.  Stomach is decompressed and unremarkable.  Small bowel loops normal in caliber.  Moderate diverticulosis of the colon, predominately in the sigmoid region, with preserved normal pericolonic fat.  The cecum is positioned in the right lower quadrant near the level of the iliac crest.  The appendix courses inferiorly and anteriorly from the cecum and measures up to 8.3 mm in diameter and the wall appears thickened.  No contrast or gas is seen within the lumen of the appendix.  There is some periappendiceal fat stranding (image #[id number]).  Negative for ascites, abscess, or free air.  Abdominal aorta is normal in caliber, and  contains scattered atherosclerotic calcification.  Vertebral bodies normal in height and alignment.  Moderate sclerosis about the right sacroiliac joint, with associated bony erosion noted.  Mild sclerosis of the left sacroiliac joint.  IMPRESSION:  1.  CT findings suggestive of early acute appendicitis.  Findings were discussed with Amy Clipper, PA, by telephone 03/22/2013 at 6:25 p.m. Reportedly, the patient does have right lower quadrant pain. 2.  Mild diffuse fatty infiltration of the liver. 3.  Colonic diverticulosis, uncomplicated. 4.  Bilateral sacroiliitis, right worse than left.   Original Report Authenticated By: Britta Mccreedy, M.D.    Procedures Laparoscopic Appendectomu  Hospital Course:  62 yr old female who presented to [facility] with abdominal pain.  Workup showed acute appenditis.  Patient was admitted and underwent procedure listed above.  Tolerated procedure well and was transferred to the floor.  Diet was advanced as tolerated.  On POD#1, the patient was voiding well, tolerating diet, ambulating well, pain well controlled, vital signs stable, incisions c/d/i and felt stable for discharge home.  Patient will follow up in our office in 2 weeks and knows to call with questions or concerns.  Physical Exam: VSS afebrile General:  Alert, NAD, pleasant, comfortable Abd:  Soft, ND, mild tenderness, incisions C/D/I, drain with minimal sanguinous drainage      Medication List    TAKE these medications       amLODipine-atorvastatin 5-10 MG per tablet  Commonly known as:  CADUET  Take 1 tablet by mouth daily.     aspirin 81 MG tablet  Take 81 mg by mouth daily.     CALCIUM + D PO  Take 1 tablet by mouth daily.     oxyCODONE-acetaminophen 5-325 MG per tablet  Commonly known as:  PERCOCET/ROXICET  Take 1-2 tablets by mouth every 6 (six) hours as needed.             Follow-up Information   Follow up with [facility] On 04/10/2013. (appointment time: 11:45.  this is a  post operative check with nurse practitioner)    Contact information:   [address]   [place] [address] [phone]       Signed: Doristine Mango  [facility] [phone]  03/23/2013, 1:16 PM

## 2013-03-23 NOTE — Progress Notes (Signed)
Amy Kerr was on 4L of oxygen at the time of her transfer to the floor from recovery.  She is more alert at this time and her oxygen saturations are improving slowly.  Lung sounds were congested with a non productive, wet cough.  I instructed her on the use of an incentive spirometer and abdominal splinting.  We will continue to wean her oxygen off as her saturations continue to improve.  At this time her saturations are 93% on 3L.

## 2013-03-23 NOTE — Progress Notes (Signed)
Discharge home. Home discharge instruction given, no question verbalized. 

## 2013-03-23 NOTE — Anesthesia Postprocedure Evaluation (Signed)
  Anesthesia Post-op Note  Patient: Amy Kerr  Procedure(s) Performed: Procedure(s): APPENDECTOMY LAPAROSCOPIC (N/A)  Patient Location: PACU  Anesthesia Type:General  Level of Consciousness: awake, sedated and patient cooperative  Airway and Oxygen Therapy: Patient Spontanous Breathing  Post-op Pain: mild  Post-op Assessment: Post-op Vital signs reviewed, Patient's Cardiovascular Status Stable, Respiratory Function Stable, Patent Airway, No signs of Nausea or vomiting and Pain level controlled  Post-op Vital Signs: stable  Complications: No apparent anesthesia complications

## 2013-03-23 NOTE — Progress Notes (Signed)
UR completed 

## 2013-03-23 NOTE — Transfer of Care (Signed)
Immediate Anesthesia Transfer of Care Note  Patient: Amy Kerr  Procedure(s) Performed: Procedure(s): APPENDECTOMY LAPAROSCOPIC (N/A)  Patient Location: PACU  Anesthesia Type:General  Level of Consciousness: awake and sedated  Airway & Oxygen Therapy: Patient Spontanous Breathing and Patient connected to nasal cannula oxygen  Post-op Assessment: Report given to PACU RN and Post -op Vital signs reviewed and stable  Post vital signs: Reviewed and stable  Complications: No apparent anesthesia complications

## 2013-03-26 NOTE — Discharge Summary (Signed)
Meliza Kage M. Yeira Gulden, MD, FACS General, Bariatric, & Minimally Invasive Surgery [facility]  

## 2013-04-10 ENCOUNTER — Encounter (INDEPENDENT_AMBULATORY_CARE_PROVIDER_SITE_OTHER): Payer: Self-pay | Admitting: Internal Medicine

## 2013-04-10 ENCOUNTER — Ambulatory Visit (INDEPENDENT_AMBULATORY_CARE_PROVIDER_SITE_OTHER): Payer: BC Managed Care – PPO | Admitting: Internal Medicine

## 2013-04-10 VITALS — BP 132/84 | HR 84 | Temp 98.0°F | Resp 18 | Ht 63.0 in | Wt 188.8 lb

## 2013-04-10 DIAGNOSIS — K3580 Unspecified acute appendicitis: Secondary | ICD-10-CM

## 2013-04-10 NOTE — Patient Instructions (Signed)
May resume regular activity without restrictions. Follow up as needed. Call with questions or concerns.  

## 2013-04-10 NOTE — Progress Notes (Signed)
  Subjective: Pt returns to the clinic today after undergoing laparoscopic appendectomy on 03/22/13 by Dr. Jamey Ripa.  The patient is tolerating their diet well and is having no severe pain.  Bowel function is good.  No problems with the wounds.  Objective: Vital signs in last 24 hours: Reviewed  PE: Abd: soft, non-tender, +bs, incisions well healed  Lab Results:  No results found for this basename: WBC, HGB, HCT, PLT,  in the last 72 hours BMET No results found for this basename: NA, K, CL, CO2, GLUCOSE, BUN, CREATININE, CALCIUM,  in the last 72 hours PT/INR No results found for this basename: LABPROT, INR,  in the last 72 hours CMP     Component Value Date/Time   NA 138 03/22/2013 1430   K 3.7 03/22/2013 1430   CL 103 03/22/2013 1430   CO2 22 03/22/2013 1430   GLUCOSE 120* 03/22/2013 1430   BUN 12 03/22/2013 1430   CREATININE 0.96 03/22/2013 1430   CALCIUM 9.0 03/22/2013 1430   PROT 8.0 03/22/2013 1430   ALBUMIN 4.3 03/22/2013 1430   AST 19 03/22/2013 1430   ALT 28 03/22/2013 1430   ALKPHOS 82 03/22/2013 1430   BILITOT 0.7 03/22/2013 1430   GFRNONAA 62* 03/22/2013 1430   GFRAA 72* 03/22/2013 1430   Lipase     Component Value Date/Time   LIPASE 24 03/22/2013 1430       Studies/Results: No results found.  Anti-infectives: Anti-infectives   None       Assessment/Plan  1.  S/P Laparoscopic Appendectomy: doing well, may resume regular activity without restrictions, Pt will follow up with [place] PRN and knows to call with questions or concerns.     WHITE, ELIZABETH 04/10/2013

## 2013-08-24 ENCOUNTER — Other Ambulatory Visit: Payer: Self-pay

## 2013-08-24 DIAGNOSIS — Z1231 Encounter for screening mammogram for malignant neoplasm of breast: Secondary | ICD-10-CM

## 2013-10-02 ENCOUNTER — Ambulatory Visit
Admission: RE | Admit: 2013-10-02 | Discharge: 2013-10-02 | Disposition: A | Discharge status: Home / Self Care | Payer: BC Managed Care – PPO | Source: Ambulatory Visit

## 2013-10-02 DIAGNOSIS — Z1231 Encounter for screening mammogram for malignant neoplasm of breast: Secondary | ICD-10-CM

## 2013-10-15 ENCOUNTER — Other Ambulatory Visit: Payer: Self-pay | Admitting: Gastroenterology

## 2013-10-23 ENCOUNTER — Encounter (HOSPITAL_COMMUNITY)
Admission: RE | Disposition: A | Discharge status: Home / Self Care | Payer: Self-pay | Source: Ambulatory Visit | Attending: Gastroenterology

## 2013-10-23 ENCOUNTER — Ambulatory Visit (HOSPITAL_COMMUNITY)
Admission: RE | Admit: 2013-10-23 | Discharge: 2013-10-23 | Disposition: A | Discharge status: Home / Self Care | Payer: BC Managed Care – PPO | Source: Ambulatory Visit | Attending: Gastroenterology | Admitting: Gastroenterology

## 2013-10-23 ENCOUNTER — Encounter (HOSPITAL_COMMUNITY): Payer: Self-pay

## 2013-10-23 DIAGNOSIS — I10 Essential (primary) hypertension: Secondary | ICD-10-CM | POA: Insufficient documentation

## 2013-10-23 DIAGNOSIS — E7800 Pure hypercholesterolemia, unspecified: Secondary | ICD-10-CM | POA: Insufficient documentation

## 2013-10-23 DIAGNOSIS — Z1211 Encounter for screening for malignant neoplasm of colon: Secondary | ICD-10-CM | POA: Insufficient documentation

## 2013-10-23 DIAGNOSIS — K5730 Diverticulosis of large intestine without perforation or abscess without bleeding: Secondary | ICD-10-CM | POA: Insufficient documentation

## 2013-10-23 HISTORY — PX: COLONOSCOPY: SHX5424

## 2013-10-23 SURGERY — COLONOSCOPY
Anesthesia: Moderate Sedation

## 2013-10-23 MED ORDER — MIDAZOLAM HCL 5 MG/5ML IJ SOLN
INTRAMUSCULAR | Status: DC | PRN
  Administered 2013-10-23 (×4): 2.5 mg via INTRAVENOUS

## 2013-10-23 MED ORDER — SODIUM CHLORIDE 0.9 % IV SOLN
INTRAVENOUS | Status: DC
  Administered 2013-10-23: 500 mL via INTRAVENOUS

## 2013-10-23 MED ORDER — FENTANYL CITRATE 0.05 MG/ML IJ SOLN
INTRAMUSCULAR | Status: DC | PRN
  Administered 2013-10-23: 25 ug via INTRAVENOUS
  Administered 2013-10-23: 50 ug via INTRAVENOUS
  Administered 2013-10-23: 25 ug via INTRAVENOUS

## 2013-10-23 MED ORDER — MIDAZOLAM HCL 10 MG/2ML IJ SOLN
INTRAMUSCULAR | Status: AC
  Filled 2013-10-23: qty 2

## 2013-10-23 MED ORDER — FENTANYL CITRATE 0.05 MG/ML IJ SOLN
INTRAMUSCULAR | Status: AC
  Filled 2013-10-23: qty 2

## 2013-10-23 NOTE — Op Note (Signed)
Procedure: Screening colonoscopy. Normal screening colonoscopy on 02/03/2004.  Endoscopist: Earle Gell  Premedication: Fentanyl 100 mcg. Versed 10 mg.  Procedure: The patient was placed in the left lateral decubitus position. Anal inspection and digital rectal exam were normal. The Pentax pediatric colonoscope was introduced into the rectum and advanced to the cecum. A normal-appearing appendiceal orifice and ileocecal valve were identified. Colonic preparation for the exam today was normal.  Rectum. Normal. Retroflexed view of the distal rectum normal  Sigmoid colon and descending colon. Left colonic diverticulosis  Splenic flexure. Normal  Transverse colon. Normal  Hepatic flexure. Normal  Ascending colon. Normal  Cecum and ileocecal valve. Normal  Assessment: Normal screening proctocolonoscopy to the cecum  Recommendations: Schedule repeat screening colonoscopy in 10 years

## 2013-10-23 NOTE — Discharge Instructions (Signed)
Colonoscopy °Care After °These instructions give you information on caring for yourself after your procedure. Your doctor may also give you more specific instructions. Call your doctor if you have any problems or questions after your procedure. °HOME CARE °· Take it easy for the next 24 hours. °· Rest. °· Walk or use warm packs on your belly (abdomen) if you have belly cramping or gas. °· Do not drive for 24 hours. °· You may shower. °· Do not sign important papers or use machinery for 24 hours. °· Drink enough fluids to keep your pee (urine) clear or pale yellow. °· Resume your normal diet. Avoid heavy or fried foods. °· Avoid alcohol. °· Continue taking your normal medicines. °· Only take medicine as told by your doctor. Do not take aspirin. °If you had growths (polyps) removed: °· Do not take aspirin. °· Do not drink alcohol for 7 days or as told by your doctor. °· Eat a soft diet for 24 hours. °GET HELP RIGHT AWAY IF: °· You have a fever. °· You pass clumps of tissue (blood clots) or fill the toilet with blood. °· You have belly pain that gets worse and medicine does not help. °· Your belly is puffy (swollen). °· You feel sick to your stomach (nauseous) or throw up (vomit). °MAKE SURE YOU: °· Understand these instructions. °· Will watch your condition. °· Will get help right away if you are not doing well or get worse. °Document Released: 10/23/2010 Document Revised: 12/13/2011 Document Reviewed: 05/28/2013 °ExitCare® Patient Information ©2014 [employer]. ° °

## 2013-10-23 NOTE — H&P (Signed)
Procedure: Screening colonoscopy  History: The patient is a 63 year old female born April 15, 1951. She is scheduled to undergo a repeat screening colonoscopy. On 02/03/2004, her screening colonoscopy was normal.  Past medical history: Hypertension. Hypercholesterolemia. Allergic rhinitis. Osteopenia. Vitamin D. deficiency. Cesarean section. Appendectomy.  Medication allergies: None  Exam: The patient is alert and lying comfortably on the endoscopy stretcher. Cardiac exam reveals a regular rhythm. Lungs are clear to auscultation. Abdomen is soft and nontender to palpation.  Plan: Proceed with repeat screening colonoscopy

## 2013-10-24 ENCOUNTER — Encounter (HOSPITAL_COMMUNITY): Payer: Self-pay | Admitting: Gastroenterology

## 2013-11-09 ENCOUNTER — Other Ambulatory Visit: Payer: Self-pay | Admitting: Obstetrics and Gynecology

## 2013-11-09 DIAGNOSIS — R922 Inconclusive mammogram: Secondary | ICD-10-CM

## 2013-11-14 ENCOUNTER — Ambulatory Visit
Admission: RE | Admit: 2013-11-14 | Discharge: 2013-11-14 | Disposition: A | Discharge status: Home / Self Care | Payer: BC Managed Care – PPO | Source: Ambulatory Visit | Attending: Obstetrics and Gynecology | Admitting: Obstetrics and Gynecology

## 2013-11-14 DIAGNOSIS — R922 Inconclusive mammogram: Secondary | ICD-10-CM

## 2015-07-11 ENCOUNTER — Emergency Department (HOSPITAL_COMMUNITY)
Admission: EM | Admit: 2015-07-11 | Discharge: 2015-07-11 | Disposition: A | Discharge status: Home / Self Care | Payer: No Typology Code available for payment source | Attending: Emergency Medicine | Admitting: Emergency Medicine

## 2015-07-11 ENCOUNTER — Emergency Department (HOSPITAL_COMMUNITY): Payer: No Typology Code available for payment source

## 2015-07-11 ENCOUNTER — Encounter (HOSPITAL_COMMUNITY): Payer: Self-pay | Admitting: Emergency Medicine

## 2015-07-11 DIAGNOSIS — S46911A Strain of unspecified muscle, fascia and tendon at shoulder and upper arm level, right arm, initial encounter: Secondary | ICD-10-CM

## 2015-07-11 DIAGNOSIS — I10 Essential (primary) hypertension: Secondary | ICD-10-CM | POA: Insufficient documentation

## 2015-07-11 DIAGNOSIS — Y92410 Unspecified street and highway as the place of occurrence of the external cause: Secondary | ICD-10-CM | POA: Diagnosis not present

## 2015-07-11 DIAGNOSIS — Y998 Other external cause status: Secondary | ICD-10-CM | POA: Diagnosis not present

## 2015-07-11 DIAGNOSIS — Z79899 Other long term (current) drug therapy: Secondary | ICD-10-CM | POA: Insufficient documentation

## 2015-07-11 DIAGNOSIS — S4991XA Unspecified injury of right shoulder and upper arm, initial encounter: Secondary | ICD-10-CM | POA: Diagnosis present

## 2015-07-11 DIAGNOSIS — Y9389 Activity, other specified: Secondary | ICD-10-CM | POA: Insufficient documentation
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
## 2015-07-11 NOTE — Discharge Instructions (Signed)
Only use the arm sling for up to 2 days. Take the arm out and rotate the shoulder every 4 hours.   Rest, Ice intermittently (in the first 24-48 hours),    Take up to 800mg  of ibuprofen (that is usually 4 over the counter pills)  3 times a day for 5 days. Take with food.  Please follow with your primary care doctor in the next 2 days for a check-up. They must obtain records for further management.   Do not hesitate to return to the Emergency Department for any new, worsening or concerning symptoms.

## 2015-07-11 NOTE — ED Notes (Signed)
Bed: WTR7 Expected date:  Expected time:  Means of arrival:  Comments: 
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
## 2015-09-01 ENCOUNTER — Other Ambulatory Visit: Payer: Self-pay | Admitting: Obstetrics and Gynecology

## 2015-09-01 DIAGNOSIS — R928 Other abnormal and inconclusive findings on diagnostic imaging of breast: Secondary | ICD-10-CM

## 2015-09-12 ENCOUNTER — Ambulatory Visit
Admission: RE | Admit: 2015-09-12 | Discharge: 2015-09-12 | Disposition: A | Discharge status: Home / Self Care | Payer: BLUE CROSS/BLUE SHIELD | Source: Ambulatory Visit | Attending: Obstetrics and Gynecology | Admitting: Obstetrics and Gynecology

## 2015-09-12 DIAGNOSIS — R928 Other abnormal and inconclusive findings on diagnostic imaging of breast: Secondary | ICD-10-CM

## 2015-11-05 ENCOUNTER — Encounter (HOSPITAL_BASED_OUTPATIENT_CLINIC_OR_DEPARTMENT_OTHER): Payer: Self-pay

## 2015-11-05 ENCOUNTER — Emergency Department (HOSPITAL_BASED_OUTPATIENT_CLINIC_OR_DEPARTMENT_OTHER)
Admission: EM | Admit: 2015-11-05 | Discharge: 2015-11-06 | Disposition: A | Discharge status: Home / Self Care | Payer: BLUE CROSS/BLUE SHIELD | Attending: Emergency Medicine | Admitting: Emergency Medicine

## 2015-11-05 ENCOUNTER — Emergency Department (HOSPITAL_BASED_OUTPATIENT_CLINIC_OR_DEPARTMENT_OTHER): Payer: BLUE CROSS/BLUE SHIELD

## 2015-11-05 DIAGNOSIS — M545 Low back pain, unspecified: Secondary | ICD-10-CM

## 2015-11-05 DIAGNOSIS — Z79899 Other long term (current) drug therapy: Secondary | ICD-10-CM | POA: Insufficient documentation

## 2015-11-05 DIAGNOSIS — Z7982 Long term (current) use of aspirin: Secondary | ICD-10-CM | POA: Insufficient documentation

## 2015-11-05 DIAGNOSIS — I10 Essential (primary) hypertension: Secondary | ICD-10-CM | POA: Insufficient documentation

## 2015-11-05 DIAGNOSIS — R1011 Right upper quadrant pain: Secondary | ICD-10-CM

## 2015-11-05 DIAGNOSIS — K828 Other specified diseases of gallbladder: Secondary | ICD-10-CM

## 2015-11-05 DIAGNOSIS — M62830 Muscle spasm of back: Secondary | ICD-10-CM

## 2015-11-05 LAB — CBC WITH DIFFERENTIAL/PLATELET
Basophils Absolute: 0 10*3/uL (ref 0.0–0.1)
Basophils Relative: 0 %
Eosinophils Absolute: 0.4 10*3/uL (ref 0.0–0.7)
Eosinophils Relative: 5 %
HCT: 42.1 % (ref 36.0–46.0)
HEMOGLOBIN: 13.9 g/dL (ref 12.0–15.0)
Lymphocytes Relative: 30 %
Lymphs Abs: 2 10*3/uL (ref 0.7–4.0)
MCH: 29.6 pg (ref 26.0–34.0)
MCHC: 33 g/dL (ref 30.0–36.0)
MCV: 89.8 fL (ref 78.0–100.0)
Monocytes Absolute: 0.7 10*3/uL (ref 0.1–1.0)
Monocytes Relative: 11 %
Neutro Abs: 3.5 10*3/uL (ref 1.7–7.7)
Neutrophils Relative %: 54 %
Platelets: 267 10*3/uL (ref 150–400)
RBC: 4.69 MIL/uL (ref 3.87–5.11)
RDW: 13.1 % (ref 11.5–15.5)
WBC: 6.6 10*3/uL (ref 4.0–10.5)

## 2015-11-05 LAB — COMPREHENSIVE METABOLIC PANEL
ALBUMIN: 4.3 g/dL (ref 3.5–5.0)
ALK PHOS: 63 U/L (ref 38–126)
ALT: 28 U/L (ref 14–54)
ANION GAP: 10 (ref 5–15)
AST: 22 U/L (ref 15–41)
BUN: 14 mg/dL (ref 6–20)
CALCIUM: 8.5 mg/dL — AB (ref 8.9–10.3)
CO2: 21 mmol/L — AB (ref 22–32)
Chloride: 108 mmol/L (ref 101–111)
Creatinine, Ser: 0.9 mg/dL (ref 0.44–1.00)
GFR calc Af Amer: >60 mL/min (ref >60)
GFR calc non Af Amer: >60 mL/min (ref >60)
GLUCOSE: 105 mg/dL — AB (ref 65–99)
Potassium: 3.7 mmol/L (ref 3.5–5.1)
Sodium: 139 mmol/L (ref 135–145)
Total Bilirubin: 0.9 mg/dL (ref 0.3–1.2)
Total Protein: 7.9 g/dL (ref 6.5–8.1)

## 2015-11-05 LAB — URINE MICROSCOPIC-ADD ON: RBC / HPF: NONE SEEN RBC/hpf (ref 0–5)

## 2015-11-05 LAB — URINALYSIS, ROUTINE W REFLEX MICROSCOPIC
Bilirubin Urine: NEGATIVE
Glucose, UA: NEGATIVE mg/dL
Hgb urine dipstick: NEGATIVE
Ketones, ur: NEGATIVE mg/dL
NITRITE: NEGATIVE
Protein, ur: NEGATIVE mg/dL
SPECIFIC GRAVITY, URINE: 1.022 (ref 1.005–1.030)
pH: 5.5 (ref 5.0–8.0)

## 2015-11-05 LAB — LIPASE, BLOOD: Lipase: 33 U/L (ref 11–51)

## 2015-11-05 NOTE — ED Notes (Signed)
C/o lower back pain, right side abd pain x 5-7 days-sent from [facility] PCP for evaluation-NAD-steady gait

## 2015-11-05 NOTE — ED Provider Notes (Signed)
CSN: [id number]     Arrival date & time 11/05/15  2056 History   First MD Initiated Contact with Patient 11/05/15 2232     Chief Complaint  Patient presents with  . Back Pain     (Consider location/radiation/quality/duration/timing/severity/associated sxs/prior Treatment) HPI Comments: Amy Kerr is a 65 y.o. female with a PMHx of HTN with a PSHx of appendectomy, who presents to the ED with complaints of one week of gradually worsening lower back pain. Patient describes the pain is 7/10 constant dull pain in the lower lumbar region, intermittently radiating up and into the right upper quadrant of her abdomen, worse with sitting, and unrelieved with heat and Tylenol. She went to a walk-in clinic this afternoon, and they told her to come to the emergency room to be evaluated for kidney stones or gallstones, but states that no labs or imaging were done at the walk-in clinic. She denies any recent heavy lifting, twisting, or bending, no prior history of back pain. No known injury.  She denies any fevers, chills, chest pain, shortness of breath, constant abdominal pain, nausea, vomiting, diarrhea, constipation, obstipation, melena, hematochezia, dysuria, hematuria, urinary frequency, vaginal bleeding or discharge, numbness, tingling, weakness, incontinence of urine or stool, cauda equina symptoms, saddle anesthesia, history of IV drug use, or history of cancer. She denies any recent travel, sick contacts, suspicious food intake, recent antibiotics, alcohol use, or chronic NSAID use.  Patient is a 65 y.o. female presenting with back pain. The history is provided by the patient. No language interpreter was used.  Back Pain Location:  Lumbar spine Quality: dull. Radiates to: intermittently into RUQ. Pain severity:  Moderate Pain is:  Same all the time Onset quality:  Gradual Duration:  1 week Timing:  Constant Progression:  Worsening Chronicity:  New Context: not lifting heavy objects, not  physical stress, not recent injury and not twisting   Relieved by:  Nothing Worsened by:  Sitting Ineffective treatments:  OTC medications and heating pad Associated symptoms: abdominal pain (intermittent RUQ)   Associated symptoms: no bladder incontinence, no bowel incontinence, no chest pain, no dysuria, no fever, no numbness, no paresthesias, no perianal numbness and no weakness   Risk factors: no hx of cancer     Past Medical History  Diagnosis Date  . Hypertension    Past Surgical History  Procedure Laterality Date  . Laparoscopic appendectomy N/A 03/22/2013    Procedure: APPENDECTOMY LAPAROSCOPIC;  Surgeon: Haywood Lasso, MD;  Location: [place];  Service: General;  Laterality: N/A;  . Colonoscopy N/A 10/23/2013    Procedure: COLONOSCOPY;  Surgeon: Garlan Fair, MD;  Location: [facility];  Service: Endoscopy;  Laterality: N/A;   No family history on file. Social History  Substance Use Topics  . Smoking status: Never Smoker   . Smokeless tobacco: Never Used  . Alcohol Use: No   OB History    No data available     Review of Systems  Constitutional: Negative for fever and chills.  Respiratory: Negative for shortness of breath.   Cardiovascular: Negative for chest pain.  Gastrointestinal: Positive for abdominal pain (intermittent RUQ). Negative for nausea, vomiting, diarrhea, constipation, blood in stool and bowel incontinence.  Genitourinary: Negative for bladder incontinence, dysuria, frequency, hematuria, vaginal bleeding, vaginal discharge and difficulty urinating (no incontinence).  Musculoskeletal: Positive for back pain. Negative for myalgias and arthralgias.  Skin: Negative for color change.  Allergic/Immunologic: Negative for immunocompromised state.  Neurological: Negative for weakness, numbness and paresthesias.  Psychiatric/Behavioral: Negative  for confusion.   10 Systems reviewed and are negative for acute change except as noted in the HPI.     Allergies  Codeine  Home Medications   Prior to Admission medications   Medication Sig Start Date End Date Taking? Authorizing Provider  amLODipine-atorvastatin (CADUET) 5-10 MG per tablet Take 1 tablet by mouth daily.    Historical Provider, MD  aspirin 81 MG tablet Take 81 mg by mouth daily.    Historical Provider, MD  Calcium Carbonate-Vitamin D (CALCIUM + D PO) Take 1 tablet by mouth daily.    Historical Provider, MD   BP 162/95 mmHg  Pulse 88  Temp(Src) 98.3 F (36.8 C) (Oral)  Resp 18  Ht 5\' 4"  (1.626 m)  Wt 87.998 kg  BMI 33.28 kg/m2  SpO2 99% Physical Exam  Constitutional: She is oriented to person, place, and time. Vital signs are normal. She appears well-developed and well-nourished.  Non-toxic appearance. No distress.  Afebrile, nontoxic, NAD  HENT:  Head: Normocephalic and atraumatic.  Mouth/Throat: Oropharynx is clear and moist and mucous membranes are normal.  Eyes: Conjunctivae and EOM are normal. Right eye exhibits no discharge. Left eye exhibits no discharge.  Neck: Normal range of motion. Neck supple.  Cardiovascular: Normal rate, regular rhythm, normal heart sounds and intact distal pulses.  Exam reveals no gallop and no friction rub.   No murmur heard. Pulmonary/Chest: Effort normal and breath sounds normal. No respiratory distress. She has no decreased breath sounds. She has no wheezes. She has no rhonchi. She has no rales.  Abdominal: Soft. Normal appearance and bowel sounds are normal. She exhibits no distension. There is tenderness in the right upper quadrant. There is positive Murphy's sign. There is no rigidity, no rebound, no guarding, no CVA tenderness and no tenderness at McBurney's point.    Soft, nondistended, +BS throughout, with mild RUQ TTP on deep palpation, no r/g/r, +murphy's (painful palpation of RUQ during inspiration, although pt still able to inspire), neg mcburney's, no CVA TTP although some mild paraspinous lumbar back tenderness as  described below  Musculoskeletal: Normal range of motion.       Lumbar back: She exhibits tenderness and spasm. She exhibits normal range of motion, no bony tenderness and no deformity.       Back:  Lumbar spine with FROM intact without spinous process TTP, no bony stepoffs or deformities, with mild b/l paraspinous muscle TTP and palpable muscle spasms. Strength 5/5 in all extremities, sensation grossly intact in all extremities, negative SLR bilaterally, gait steady and nonantalgic. No overlying skin changes.   Neurological: She is alert and oriented to person, place, and time. She has normal strength. No sensory deficit.  Skin: Skin is warm, dry and intact. No rash noted.  Psychiatric: She has a normal mood and affect.  Nursing note and vitals reviewed.   ED Course  Procedures (including critical care time) Labs Review Labs Reviewed  URINALYSIS, ROUTINE W REFLEX MICROSCOPIC (NOT AT [facility]) - Abnormal; Notable for the following:    Leukocytes, UA SMALL (*)    All other components within normal limits  URINE MICROSCOPIC-ADD ON - Abnormal; Notable for the following:    Squamous Epithelial / LPF 0-5 (*)    Bacteria, UA RARE (*)    All other components within normal limits  COMPREHENSIVE METABOLIC PANEL - Abnormal; Notable for the following:    CO2 21 (*)    Glucose, Bld 105 (*)    Calcium 8.5 (*)    All other  components within normal limits  CBC WITH DIFFERENTIAL/PLATELET  LIPASE, BLOOD    Imaging Review US Abdomen Complete  11/06/2015  CLINICAL DATA:  Right upper quadrant tenderness with positive Murphy's sign. Evaluate for biliary etiology. Also with flank pain, evaluate for kidney stone versus pyelonephritis. Symptoms for 10 days, increased over the last 2 days. EXAM: ABDOMEN ULTRASOUND COMPLETE COMPARISON:  CT 03/22/2013 FINDINGS: Gallbladder: Physiologically distended. Minimal sludge but no gallstones. No wall thickening visualized. No sonographic Murphy sign noted by sonographer.  Common bile duct: Diameter: 2 mm. Liver: No focal lesion identified. Increased and heterogeneous in parenchymal echogenicity consistent with steatosis, scattered areas of fatty sparing. IVC: No abnormality visualized. Pancreas: Visualized portion unremarkable. Spleen: Size and appearance within normal limits. Splenule noted inferiorly. Right Kidney: Length: 10.6 cm. Echogenicity within normal limits. No mass or hydronephrosis visualized. No perinephric fluid collection. Left Kidney: Length: 10.7 cm. Echogenicity within normal limits. No mass or hydronephrosis visualized. No perinephric fluid collection. Abdominal aorta: No aneurysm visualized. Other findings: None.  No ascites. IMPRESSION: 1. Minimal gallbladder sludge, no gallstones or acute cholecystitis. 2. Normal sonographic appearance of the kidneys. 3. Hepatic steatosis. Electronically Signed   By: Jeb Levering M.D.   On: 11/06/2015 00:14   I have personally reviewed and evaluated these images and lab results as part of my medical decision-making.   EKG Interpretation None      MDM   Final diagnoses:  Bilateral low back pain without sciatica  RUQ abdominal pain  HTN (hypertension), benign  Back muscle spasm  Gallbladder sludge    65 y.o. female here with b/l low back pain intermittently radiating to RUQ x7 days. Went to walk in clinic and they told her to come here to r/o kidney stone or gallstones, no labs or imaging done there. Denies trauma or known injury to back. On exam, mild RUQ TTP with +murphy's, mild b/l paraspinous muscle TTP and spasm in lumbar region but no CVA tenderness. No s/sx for cauda equina or cord compression. No red flag s/s of low back pain. No s/s of central cord compression or cauda equina. Lower extremities are neurovascularly intact and patient is ambulating without difficulty. U/A with 0-5 squamous, 0-5 WBC, no RBCs, and rare bacteria. Doubt UTI or pyelo given this U/A. Also doubt kidney stone, especially  given her presentation of 1wk of symptoms without n/v. Will get labs and abd u/s. Pt declines wanting anything for pain despite multiple offers. Will reassess shortly.   12:28 AM CBC w/diff unremarkable. CMP WNL. Lipase WNL. U/S showing gallbladder sludge without cholecystitis, which could indicate dyskinesia/dysfunction of gallbladder, potentially causing her symptoms. Kidneys WNL on u/s, no other acute findings. Discussed that she could f/up with surgeon to discuss possible cholecystectomy outpt, or potentially first see her PCP for HIDA scan to fully evaluate whether the gallbladder is the issue.   For her back pain,Patient was counseled on back pain precautions and told to do activity as tolerated but do not lift, push, or pull heavy objects more than 10 pounds for the next week. Patient counseled to use ice or heat on back for no longer than 15 minutes every hour.  Rx given for muscle relaxer and counseled on proper use of muscle relaxant medication. Rx given for naprosyn. Discussed use of tylenol. Urged patient not to drink alcohol, drive, or perform any other activities that requires focus while taking flexeril.  Patient urged to follow-up with PCP if pain does not improve with treatment and rest or if  pain becomes recurrent. Urged to return with worsening severe pain, loss of bowel or bladder control, trouble walking. The patient verbalizes understanding and agrees with the plan.   I explained the diagnosis and have given explicit precautions to return to the ER including for any other new or worsening symptoms. The patient understands and accepts the medical plan as it's been dictated and I have answered their questions. Discharge instructions concerning home care and prescriptions have been given. The patient is STABLE and is discharged to home in good condition.  BP 162/95 mmHg  Pulse 88  Temp(Src) 98.3 F (36.8 C) (Oral)  Resp 18  Ht 5\' 4"  (1.626 m)  Wt 87.998 kg  BMI 33.28 kg/m2  SpO2  99%  Meds ordered this encounter  Medications  . cyclobenzaprine (FLEXERIL) 10 MG tablet    Sig: Take 1 tablet (10 mg total) by mouth 3 (three) times daily as needed for muscle spasms.    Dispense:  15 tablet    Refill:  0    Order Specific Question:  Supervising Provider    Answer:  MILLER, BRIAN [[id number]]  . naproxen (NAPROSYN) 500 MG tablet    Sig: Take 1 tablet (500 mg total) by mouth 2 (two) times daily as needed for mild pain, moderate pain or headache (TAKE WITH MEALS.).    Dispense:  20 tablet    Refill:  0    Order Specific Question:  Supervising Provider    Answer:  Noemi Chapel [[id number]]       Jaysean Manville Camprubi-Soms, PA-C 11/06/15 0031  Tanna Furry, MD 11/12/15 [phone]

## 2015-11-06 MED ORDER — NAPROXEN 500 MG PO TABS: 500.0000 mg | ORAL_TABLET | Freq: Two times a day (BID) | ORAL | Status: DC | PRN

## 2015-11-06 MED ORDER — CYCLOBENZAPRINE HCL 10 MG PO TABS: 10.0000 mg | ORAL_TABLET | Freq: Three times a day (TID) | ORAL | Status: DC | PRN

## 2015-11-06 NOTE — Discharge Instructions (Signed)
Your abdominal pain, and potentially your back pain, could be due to your gallbladder. The ultrasound today shows some sludge, which could indicate that it's not functioning appropriately. Follow up with the surgeon in 1-2 weeks for ongoing management of your gallbladder sludge and discussion of possible gallbladder removal as an outpatient. Eat a low fat diet, as described below.  Back Pain: Your back pain should be treated with medicines such as ibuprofen or aleve and this back pain should get better over the next 2 weeks.  However if you develop severe or worsening pain, low back pain with fever, numbness, weakness or inability to walk or urinate, you should return to the ER immediately.  Please follow up with your doctor this week for a recheck if still having symptoms.  Avoid heavy lifting over 10 pounds over the next two weeks.  Low back pain is discomfort in the lower back that may be due to injuries to muscles and ligaments around the spine.  Occasionally, it may be caused by a a problem to a part of the spine called a disc.  The pain may last several days or a week;  However, most patients get completely well in 4 weeks.  Self - care:  The application of heat can help soothe the pain.  Maintaining your daily activities, including walking, is encourged, as it will help you get better faster than just staying in bed. Perform gentle stretching as discussed. Drink plenty of fluids.  Medications are also useful to help with pain control.  A commonly prescribed medication includes tylenol.  Non steroidal anti inflammatory medications including Ibuprofen and naproxen;  These medications help both pain and swelling and are very useful in treating back pain.  They should be taken with food, as they can cause stomach upset, and more seriously, stomach bleeding.    Muscle relaxants:  These medications can help with muscle tightness that is a cause of lower back pain.  Most of these medications can cause  drowsiness, and it is not safe to drive or use dangerous machinery while taking them.  SEEK IMMEDIATE MEDICAL ATTENTION IF: New numbness, tingling, weakness, or problem with the use of your arms or legs.  Severe back pain not relieved with medications.  Difficulty with or loss of control of your bowel or bladder control.  Increasing pain in any areas of the body (such as chest or abdominal pain).  Shortness of breath, dizziness or fainting.  Nausea (feeling sick to your stomach), vomiting, fever, or sweats.  You will need to follow up with  Your primary healthcare provider in 1-2 weeks for reassessment.   Abdominal Pain, Adult Many things can cause belly (abdominal) pain. Most times, the belly pain is not dangerous. Many cases of belly pain can be watched and treated at home. HOME CARE   Do not take medicines that help you go poop (laxatives) unless told to by your doctor.  Only take medicine as told by your doctor.  Eat or drink as told by your doctor. Your doctor will tell you if you should be on a special diet. GET HELP IF:  You do not know what is causing your belly pain.  You have belly pain while you are sick to your stomach (nauseous) or have runny poop (diarrhea).  You have pain while you pee or poop.  Your belly pain wakes you up at night.  You have belly pain that gets worse or better when you eat.  You have belly pain  that gets worse when you eat fatty foods.  You have a fever. GET HELP RIGHT AWAY IF:   The pain does not go away within 2 hours.  You keep throwing up (vomiting).  The pain changes and is only in the right or left part of the belly.  You have bloody or tarry looking poop. MAKE SURE YOU:   Understand these instructions.  Will watch your condition.  Will get help right away if you are not doing well or get worse.   This information is not intended to replace advice given to you by your health care provider. Make sure you discuss any  questions you have with your health care provider.   Document Released: 03/08/2008 Document Revised: 10/11/2014 Document Reviewed: 05/30/2013 Elsevier Interactive Patient Education 2016 [employer]  Biliary Colic Biliary colic is a pain in the upper abdomen. The pain:  Is usually felt on the right side of the abdomen, but it may also be felt in the center of the abdomen, just below the breastbone (sternum).  May spread back toward the right shoulder blade.  May be steady or irregular.  May be accompanied by nausea and vomiting. Most of the time, the pain goes away in 1-5 hours. After the most intense pain passes, the abdomen may continue to ache mildly for about 24 hours. Biliary colic is caused by a blockage in the bile duct. The bile duct is a pathway that carries bile--a liquid that helps to digest fats--from the gallbladder to the small intestine. Biliary colic usually occurs after eating, when the digestive system demands bile. The pain develops when muscle cells contract forcefully to try to move the blockage so that bile can get by. HOME CARE INSTRUCTIONS  Take medicines only as directed by your health care provider.  Drink enough fluid to keep your urine clear or pale yellow.  Avoid fatty, greasy, and fried foods. These kinds of foods increase your body's demand for bile.  Avoid any foods that make your pain worse.  Avoid overeating.  Avoid having a large meal after fasting. SEEK MEDICAL CARE IF:  You develop a fever.  Your pain gets worse.  You vomit.  You develop nausea that prevents you from eating and drinking. SEEK IMMEDIATE MEDICAL CARE IF:  You suddenly develop a fever and shaking chills.  You develop a yellowish discoloration (jaundice) of:  Skin.  Whites of the eyes.  Mucous membranes.  You have continuous or severe pain that is not relieved with medicines.  You have nausea and vomiting that is not relieved with medicines.  You develop  dizziness or you faint.   This information is not intended to replace advice given to you by your health care provider. Make sure you discuss any questions you have with your health care provider.   Document Released: 02/21/2006 Document Revised: 02/04/2015 Document Reviewed: 07/02/2014 Elsevier Interactive Patient Education 2016 [employer]  Low-Fat Diet for Pancreatitis or Gallbladder Conditions A low-fat diet can be helpful if you have pancreatitis or a gallbladder condition. With these conditions, your pancreas and gallbladder have trouble digesting fats. A healthy eating plan with less fat will help rest your pancreas and gallbladder and reduce your symptoms. WHAT DO I NEED TO KNOW ABOUT THIS DIET?  Eat a low-fat diet.  Reduce your fat intake to less than 20-30% of your total daily calories. This is less than 50-60 g of fat per day.  Remember that you need some fat in your diet. Ask your  dietician what your daily goal should be.  Choose nonfat and low-fat healthy foods. Look for the words "nonfat," "low fat," or "fat free."  As a guide, look on the label and choose foods with less than 3 g of fat per serving. Eat only one serving.  Avoid alcohol.  Do not smoke. If you need help quitting, talk with your health care provider.  Eat small frequent meals instead of three large heavy meals. WHAT FOODS CAN I EAT? Grains Include healthy grains and starches such as potatoes, wheat bread, fiber-rich cereal, and brown rice. Choose whole grain options whenever possible. In adults, whole grains should account for 45-65% of your daily calories.  Fruits and Vegetables Eat plenty of fruits and vegetables. Fresh fruits and vegetables add fiber to your diet. Meats and Other Protein Sources Eat lean meat such as chicken and pork. Trim any fat off of meat before cooking it. Eggs, fish, and beans are other sources of protein. In adults, these foods should account for 10-35% of your daily  calories. Dairy Choose low-fat milk and dairy options. Dairy includes fat and protein, as well as calcium.  Fats and Oils Limit high-fat foods such as fried foods, sweets, baked goods, sugary drinks.  Other Creamy sauces and condiments, such as mayonnaise, can add extra fat. Think about whether or not you need to use them, or use smaller amounts or low fat options. WHAT FOODS ARE NOT RECOMMENDED?  High fat foods, such as:  [employer].  Ice cream.  [place] toast.  Sweet rolls.  Pizza.  Cheese bread.  Foods covered with batter, butter, creamy sauces, or cheese.  Fried foods.  Sugary drinks and desserts.  Foods that cause gas or bloating   This information is not intended to replace advice given to you by your health care provider. Make sure you discuss any questions you have with your health care provider.   Document Released: 09/25/2013 Document Reviewed: 09/25/2013 Elsevier Interactive Patient Education 2016 [employer] Inc[employer]  Back Pain, Adult Back pain is very common in adults.The cause of back pain is rarely dangerous and the pain often gets better over time.The cause of your back pain may not be known. Some common causes of back pain include:  Strain of the muscles or ligaments supporting the spine.  Wear and tear (degeneration) of the spinal disks.  Arthritis.  Direct injury to the back. For many people, back pain may return. Since back pain is rarely dangerous, most people can learn to manage this condition on their own. HOME CARE INSTRUCTIONS Watch your back pain for any changes. The following actions may help to lessen any discomfort you are feeling:  Remain active. It is stressful on your back to sit or stand in one place for long periods of time. Do not sit, drive, or stand in one place for more than 30 minutes at a time. Take short walks on even surfaces as soon as you are able.Try to increase the length of time you walk each day.  Exercise regularly as  directed by your health care provider. Exercise helps your back heal faster. It also helps avoid future injury by keeping your muscles strong and flexible.  Do not stay in bed.Resting more than 1-2 days can delay your recovery.  Pay attention to your body when you bend and lift. The most comfortable positions are those that put less stress on your recovering back. Always use proper lifting techniques, including:  Bending your knees.  Keeping the load close  to your body.  Avoiding twisting.  Find a comfortable position to sleep. Use a firm mattress and lie on your side with your knees slightly bent. If you lie on your back, put a pillow under your knees.  Avoid feeling anxious or stressed.Stress increases muscle tension and can worsen back pain.It is important to recognize when you are anxious or stressed and learn ways to manage it, such as with exercise.  Take medicines only as directed by your health care provider. Over-the-counter medicines to reduce pain and inflammation are often the most helpful.Your health care provider may prescribe muscle relaxant drugs.These medicines help dull your pain so you can more quickly return to your normal activities and healthy exercise.  Apply ice to the injured area:  Put ice in a plastic bag.  Place a towel between your skin and the bag.  Leave the ice on for 20 minutes, 2-3 times a day for the first 2-3 days. After that, ice and heat may be alternated to reduce pain and spasms.  Maintain a healthy weight. Excess weight puts extra stress on your back and makes it difficult to maintain good posture. SEEK MEDICAL CARE IF:  You have pain that is not relieved with rest or medicine.  You have increasing pain going down into the legs or buttocks.  You have pain that does not improve in one week.  You have night pain.  You lose weight.  You have a fever or chills. SEEK IMMEDIATE MEDICAL CARE IF:   You develop new bowel or bladder  control problems.  You have unusual weakness or numbness in your arms or legs.  You develop nausea or vomiting.  You develop abdominal pain.  You feel faint.   This information is not intended to replace advice given to you by your health care provider. Make sure you discuss any questions you have with your health care provider.   Document Released: 09/20/2005 Document Revised: 10/11/2014 Document Reviewed: 01/22/2014 Elsevier Interactive Patient Education 2016 [employer] Inc[employer]  Muscle Cramps and Spasms Muscle cramps and spasms occur when a muscle or muscles tighten and you have no control over this tightening (involuntary muscle contraction). They are a common problem and can develop in any muscle. The most common place is in the calf muscles of the leg. Both muscle cramps and muscle spasms are involuntary muscle contractions, but they also have differences:   Muscle cramps are sporadic and painful. They may last a few seconds to a quarter of an hour. Muscle cramps are often more forceful and last longer than muscle spasms.  Muscle spasms may or may not be painful. They may also last just a few seconds or much longer. CAUSES  It is uncommon for cramps or spasms to be due to a serious underlying problem. In many cases, the cause of cramps or spasms is unknown. Some common causes are:   Overexertion.   Overuse from repetitive motions (doing the same thing over and over).   Remaining in a certain position for a long period of time.   Improper preparation, form, or technique while performing a sport or activity.   Dehydration.   Injury.   Side effects of some medicines.   Abnormally low levels of the salts and ions in your blood (electrolytes), especially potassium and calcium. This could happen if you are taking water pills (diuretics) or you are pregnant.  Some underlying medical problems can make it more likely to develop cramps or spasms. These include, but are not  limited to:   Diabetes.   Parkinson disease.   Hormone disorders, such as thyroid problems.   Alcohol abuse.   Diseases specific to muscles, joints, and bones.   Blood vessel disease where not enough blood is getting to the muscles.  HOME CARE INSTRUCTIONS   Stay well hydrated. Drink enough water and fluids to keep your urine clear or pale yellow.  It may be helpful to massage, stretch, and relax the affected muscle.  For tight or tense muscles, use a warm towel, heating pad, or hot shower water directed to the affected area.  If you are sore or have pain after a cramp or spasm, applying ice to the affected area may relieve discomfort.  Put ice in a plastic bag.  Place a towel between your skin and the bag.  Leave the ice on for 15-20 minutes, 03-04 times a day.  Medicines used to treat a known cause of cramps or spasms may help reduce their frequency or severity. Only take over-the-counter or prescription medicines as directed by your caregiver. SEEK MEDICAL CARE IF:  Your cramps or spasms get more severe, more frequent, or do not improve over time.  MAKE SURE YOU:   Understand these instructions.  Will watch your condition.  Will get help right away if you are not doing well or get worse.   This information is not intended to replace advice given to you by your health care provider. Make sure you discuss any questions you have with your health care provider.   Document Released: 03/12/2002 Document Revised: 01/15/2013 Document Reviewed: 09/06/2012 Elsevier Interactive Patient Education 2016 [place] Injury Prevention Back injuries can be very painful. They can also be difficult to heal. After having one back injury, you are more likely to injure your back again. It is important to learn how to avoid injuring or re-injuring your back. The following tips can help you to prevent a back injury. WHAT SHOULD I KNOW ABOUT PHYSICAL FITNESS?  Exercise for  30 minutes per day on most days of the week or as told by your doctor. Make sure to:  Do aerobic exercises, such as walking, jogging, biking, or swimming.  Do exercises that increase balance and strength, such as tai chi and yoga.  Do stretching exercises. This helps with flexibility.  Try to develop strong belly (abdominal) muscles. Your belly muscles help to support your back.  Stay at a healthy weight. This helps to decrease your risk of a back injury. WHAT SHOULD I KNOW ABOUT MY DIET?  Talk with your doctor about your overall diet. Take supplements and vitamins only as told by your doctor.  Talk with your doctor about how much calcium and vitamin D you need each day. These nutrients help to prevent weakening of the bones (osteoporosis).  Include good sources of calcium in your diet, such as:  Dairy products.  Green leafy vegetables.  Products that have had calcium added to them (fortified).  Include good sources of vitamin D in your diet, such as:  Milk.  Foods that have had vitamin D added to them. WHAT SHOULD I KNOW ABOUT MY POSTURE?  Sit up straight and stand up straight. Avoid leaning forward when you sit or hunching over when you stand.  Choose chairs that have good low-back (lumbar) support.  If you work at a desk, sit close to it so you do not need to lean over. Keep your chin tucked in. Keep your neck drawn back. Keep your  elbows bent so your arms look like the letter "L" (right angle).  Sit high and close to the steering wheel when you drive. Add a low-back support to your car seat, if needed.  Avoid sitting or standing in one position for very long. Take breaks to get up, stretch, and walk around at least one time every hour. Take breaks every hour if you are driving for long periods of time.  Sleep on your side with your knees slightly bent, or sleep on your back with a pillow under your knees. Do not lie on the front of your body to sleep. WHAT SHOULD I KNOW  ABOUT LIFTING, TWISTING, AND REACHING Lifting and Heavy Lifting  Avoid heavy lifting, especially lifting over and over again. If you must do heavy lifting:  Stretch before lifting.  Work slowly.  Rest between lifts.  Use a tool such as a cart or a dolly to move objects if one is available.  Make several small trips instead of carrying one heavy load.  Ask for help when you need it, especially when moving big objects.  Follow these steps when lifting:  Stand with your feet shoulder-width apart.  Get as close to the object as you can. Do not pick up a heavy object that is far from your body.  Use handles or lifting straps if they are available.  Bend at your knees. Squat down, but keep your heels off the floor.  Keep your shoulders back. Keep your chin tucked in. Keep your back straight.  Lift the object slowly while you tighten the muscles in your legs, belly, and butt. Keep the object as close to the center of your body as possible.  Follow these steps when putting down a heavy load:  Stand with your feet shoulder-width apart.  Lower the object slowly while you tighten the muscles in your legs, belly, and butt. Keep the object as close to the center of your body as possible.  Keep your shoulders back. Keep your chin tucked in. Keep your back straight.  Bend at your knees. Squat down, but keep your heels off the floor.  Use handles or lifting straps if they are available. Twisting and Reaching  Avoid lifting heavy objects above your waist.  Do not twist at your waist while you are lifting or carrying a load. If you need to turn, move your feet.  Do not bend over without bending at your knees.  Avoid reaching over your head, across a table, or for an object on a high surface.  WHAT ARE SOME OTHER TIPS? 1. Avoid wet floors and icy ground. Keep sidewalks clear of ice to prevent falls.  2. Do not sleep on a mattress that is too soft or too hard.  3. Keep items  that you use often within easy reach.  4. Put heavier objects on shelves at waist level, and put lighter objects on lower or higher shelves. 5. Find ways to lower your stress, such as: 1. Exercise. 2. Massage. 3. Relaxation techniques. 6. Talk with your doctor if you feel anxious or depressed. These conditions can make back pain worse. 7. Wear flat heel shoes with cushioned soles. 8. Avoid making quick (sudden) movements. 9. Use both shoulder straps when carrying a backpack. 10. Do not use any tobacco products, including cigarettes, chewing tobacco, or electronic cigarettes. If you need help quitting, ask your doctor.   This information is not intended to replace advice given to you by your health care provider.  Make sure you discuss any questions you have with your health care provider.   Document Released: 03/08/2008 Document Revised: 02/04/2015 Document Reviewed: 09/24/2014 Elsevier Interactive Patient Education 2016 [place].  Back Exercises If you have pain in your back, do these exercises 2-3 times each day or as told by your doctor. When the pain goes away, do the exercises once each day, but repeat the steps more times for each exercise (do more repetitions). If you do not have pain in your back, do these exercises once each day or as told by your doctor. EXERCISES Single Knee to Chest Do these steps 3-5 times in a row for each leg:  Lie on your back on a firm bed or the floor with your legs stretched out.  Bring one knee to your chest.  Hold your knee to your chest by grabbing your knee or thigh.  Pull on your knee until you feel a gentle stretch in your lower back.  Keep doing the stretch for 10-30 seconds.  Slowly let go of your leg and straighten it. Pelvic Tilt Do these steps 5-10 times in a row:  Lie on your back on a firm bed or the floor with your legs stretched out.  Bend your knees so they point up to the ceiling. Your feet should be flat on the  floor.  Tighten your lower belly (abdomen) muscles to press your lower back against the floor. This will make your tailbone point up to the ceiling instead of pointing down to your feet or the floor.  Stay in this position for 5-10 seconds while you gently tighten your muscles and breathe evenly. Cat-Cow Do these steps until your lower back bends more easily:  Get on your hands and knees on a firm surface. Keep your hands under your shoulders, and keep your knees under your hips. You may put padding under your knees.  Let your head hang down, and make your tailbone point down to the floor so your lower back is round like the back of a cat.  Stay in this position for 5 seconds.  Slowly lift your head and make your tailbone point up to the ceiling so your back hangs low (sags) like the back of a cow.  Stay in this position for 5 seconds. Press-Ups Do these steps 5-10 times in a row:  Lie on your belly (face-down) on the floor.  Place your hands near your head, about shoulder-width apart.  While you keep your back relaxed and keep your hips on the floor, slowly straighten your arms to raise the top half of your body and lift your shoulders. Do not use your back muscles. To make yourself more comfortable, you may change where you place your hands.  Stay in this position for 5 seconds.  Slowly return to lying flat on the floor. Bridges Do these steps 10 times in a row:  Lie on your back on a firm surface.  Bend your knees so they point up to the ceiling. Your feet should be flat on the floor.  Tighten your butt muscles and lift your butt off of the floor until your waist is almost as high as your knees. If you do not feel the muscles working in your butt and the back of your thighs, slide your feet 1-2 inches farther away from your butt.  Stay in this position for 3-5 seconds.  Slowly lower your butt to the floor, and let your butt muscles relax. If this exercise  is too easy, try  doing it with your arms crossed over your chest. Belly Crunches Do these steps 5-10 times in a row: 11. Lie on your back on a firm bed or the floor with your legs stretched out. 12. Bend your knees so they point up to the ceiling. Your feet should be flat on the floor. 34. Cross your arms over your chest. 14. Tip your chin a little bit toward your chest but do not bend your neck. 6. Tighten your belly muscles and slowly raise your chest just enough to lift your shoulder blades a tiny bit off of the floor. 16. Slowly lower your chest and your head to the floor. Back Lifts Do these steps 5-10 times in a row: 1. Lie on your belly (face-down) with your arms at your sides, and rest your forehead on the floor. 2. Tighten the muscles in your legs and your butt. 3. Slowly lift your chest off of the floor while you keep your hips on the floor. Keep the back of your head in line with the curve in your back. Look at the floor while you do this. 4. Stay in this position for 3-5 seconds. 5. Slowly lower your chest and your face to the floor. GET HELP IF:  Your back pain gets a lot worse when you do an exercise.  Your back pain does not lessen 2 hours after you exercise. If you have any of these problems, stop doing the exercises. Do not do them again unless your doctor says it is okay. GET HELP RIGHT AWAY IF:  You have sudden, very bad back pain. If this happens, stop doing the exercises. Do not do them again unless your doctor says it is okay.   This information is not intended to replace advice given to you by your health care provider. Make sure you discuss any questions you have with your health care provider.   Document Released: 10/23/2010 Document Revised: 06/11/2015 Document Reviewed: 11/14/2014 Elsevier Interactive Patient Education 2016 [place] therapy can help ease sore, stiff, injured, and tight muscles and joints. Heat relaxes your muscles, which may help  ease your pain. Heat therapy should only be used on old, pre-existing, or long-lasting (chronic) injuries. Do not use heat therapy unless told by your doctor. HOW TO USE HEAT THERAPY There are several different kinds of heat therapy, including:  Moist heat pack.  Warm water bath.  Hot water bottle.  Electric heating pad.  Heated gel pack.  Heated wrap.  Electric heating pad. GENERAL HEAT THERAPY RECOMMENDATIONS   Do not sleep while using heat therapy. Only use heat therapy while you are awake.  Your skin may turn pink while using heat therapy. Do not use heat therapy if your skin turns red.  Do not use heat therapy if you have new pain.  High heat or long exposure to heat can cause burns. Be careful when using heat therapy to avoid burning your skin.  Do not use heat therapy on areas of your skin that are already irritated, such as with a rash or sunburn. GET HELP IF:   You have blisters, redness, swelling (puffiness), or numbness.  You have new pain.  Your pain is worse. MAKE SURE YOU:  Understand these instructions.  Will watch your condition.  Will get help right away if you are not doing well or get worse.   This information is not intended to replace advice given to you by your health  care provider. Make sure you discuss any questions you have with your health care provider.   Document Released: 12/13/2011 Document Revised: 10/11/2014 Document Reviewed: 11/13/2013 Elsevier Interactive Patient Education [employer].

## 2016-02-25 ENCOUNTER — Other Ambulatory Visit: Payer: Self-pay

## 2016-02-25 ENCOUNTER — Other Ambulatory Visit: Payer: Self-pay | Admitting: Obstetrics and Gynecology

## 2016-02-25 DIAGNOSIS — N6320 Unspecified lump in the left breast, unspecified quadrant: Secondary | ICD-10-CM

## 2016-03-19 ENCOUNTER — Ambulatory Visit
Admission: RE | Admit: 2016-03-19 | Discharge: 2016-03-19 | Disposition: A | Discharge status: Home / Self Care | Payer: BLUE CROSS/BLUE SHIELD | Source: Ambulatory Visit | Attending: Obstetrics and Gynecology | Admitting: Obstetrics and Gynecology

## 2016-03-19 DIAGNOSIS — N6320 Unspecified lump in the left breast, unspecified quadrant: Secondary | ICD-10-CM

## 2016-10-29 ENCOUNTER — Other Ambulatory Visit: Payer: Self-pay | Admitting: Obstetrics and Gynecology

## 2016-10-29 DIAGNOSIS — N63 Unspecified lump in unspecified breast: Secondary | ICD-10-CM

## 2016-11-18 ENCOUNTER — Ambulatory Visit
Admission: RE | Admit: 2016-11-18 | Discharge: 2016-11-18 | Disposition: A | Discharge status: Home / Self Care | Payer: BLUE CROSS/BLUE SHIELD | Source: Ambulatory Visit | Attending: Obstetrics and Gynecology | Admitting: Obstetrics and Gynecology

## 2016-11-18 DIAGNOSIS — N63 Unspecified lump in unspecified breast: Secondary | ICD-10-CM

## 2017-12-09 ENCOUNTER — Ambulatory Visit (INDEPENDENT_AMBULATORY_CARE_PROVIDER_SITE_OTHER): Payer: BLUE CROSS/BLUE SHIELD

## 2017-12-09 ENCOUNTER — Ambulatory Visit (INDEPENDENT_AMBULATORY_CARE_PROVIDER_SITE_OTHER): Payer: BLUE CROSS/BLUE SHIELD | Admitting: Podiatry

## 2017-12-09 DIAGNOSIS — M19079 Primary osteoarthritis, unspecified ankle and foot: Secondary | ICD-10-CM | POA: Diagnosis not present

## 2017-12-09 NOTE — Progress Notes (Signed)
   Subjective:    Patient ID: Amy Kerr, female    DOB: 06/05/1951, 67 y.o.   MRN: [id number]  HPI    Review of Systems  All other systems reviewed and are negative.      Objective:   Physical Exam        Assessment & Plan:

## 2017-12-13 NOTE — Progress Notes (Signed)
  Subjective:  Patient ID: Amy Kerr, female    DOB: December 21, 1950,  MRN: [id number]  Chief Complaint  Patient presents with  . Foot Pain    R dorsal foot x 2 weeks; 7/10 achy pain Tx: none   67 y.o. female presents with the above complaint.  States that she has pain to the top of the right foot times 2 months.  6 out of 10 aching in nature denies injury.  Denies prior treatments.  Past Medical History:  Diagnosis Date  . Hypertension    Past Surgical History:  Procedure Laterality Date  . COLONOSCOPY N/A 10/23/2013   Procedure: COLONOSCOPY;  Surgeon: Garlan Fair, MD;  Location: [facility];  Service: Endoscopy;  Laterality: N/A;  . LAPAROSCOPIC APPENDECTOMY N/A 03/22/2013   Procedure: APPENDECTOMY LAPAROSCOPIC;  Surgeon: Haywood Lasso, MD;  Location: [facility];  Service: General;  Laterality: N/A;    Current Outpatient Medications:  .  amLODipine-atorvastatin (CADUET) 5-10 MG per tablet, Take 1 tablet by mouth daily., Disp: , Rfl:  .  aspirin 81 MG tablet, Take 81 mg by mouth daily., Disp: , Rfl:  .  Calcium Carbonate-Vitamin D (CALCIUM + D PO), Take 1 tablet by mouth daily., Disp: , Rfl:  .  cyclobenzaprine (FLEXERIL) 10 MG tablet, Take 1 tablet (10 mg total) by mouth 3 (three) times daily as needed for muscle spasms., Disp: 15 tablet, Rfl: 0 .  naproxen (NAPROSYN) 500 MG tablet, Take 1 tablet (500 mg total) by mouth 2 (two) times daily as needed for mild pain, moderate pain or headache (TAKE WITH MEALS.)., Disp: 20 tablet, Rfl: 0  Allergies  Allergen Reactions  . Codeine Nausea Only   Review of Systems all systems reviewed negative except as noted in HPI Objective:  There were no vitals filed for this visit. General AA&O x3. Normal mood and affect.  Vascular Dorsalis pedis and posterior tibial pulses  present 2+ bilaterally  Capillary refill normal to all digits. Pedal hair growth normal.  Neurologic Epicritic sensation grossly present.  Dermatologic No open  lesions. Interspaces clear of maceration. Nails well groomed and normal in appearance.  Orthopedic: MMT 5/5 in dorsiflexion, plantarflexion, inversion, and eversion. Right foot dorsal midfoot osteophyte formation with pain to palpation.   Assessment & Plan:  Patient was evaluated and treated and all questions answered.  Midfoot arthritis right -X-rays taken reviewed consistent of degenerative changes of the midfoot without acute fracture dislocation. -Injection delivered as below. -Educated on proper shoe gear including skipping lacing over the midfoot.  Procedure: Joint Injection Location: Right dorsal TMT's joint Skin Prep: Alcohol. Injectate: 0.5 cc 1% lidocaine plain, 0.5 cc dexamethasone phosphate. Disposition: Patient tolerated procedure well. Injection site dressed with a band-aid.    Return in about 3 weeks (around 12/30/2017) for arthritis f/u.

## 2017-12-27 ENCOUNTER — Ambulatory Visit (INDEPENDENT_AMBULATORY_CARE_PROVIDER_SITE_OTHER): Payer: BLUE CROSS/BLUE SHIELD | Admitting: Podiatry

## 2017-12-27 DIAGNOSIS — M19079 Primary osteoarthritis, unspecified ankle and foot: Secondary | ICD-10-CM | POA: Diagnosis not present

## 2017-12-27 NOTE — Progress Notes (Signed)
  Subjective:  Patient ID: Amy Kerr, female    DOB: 08/31/1951,  MRN: [id number]  Chief Complaint  Patient presents with  . Arthritis    F/U Rt arthritis Pt. stated," it's about the same; 4/10 achy pain. Also, walking a lot seems to help more with the pain than when I sit." Tx: none   67 y.o. female returns for the above complaint.  Reports 4-10 achy pain.  States that the pain is noticed most when she is at rest and gets better with her walking on it.  States the injection helped her for several days.  Objective:  There were no vitals filed for this visit. General AA&O x3. Normal mood and affect.  Vascular Pedal pulses palpable.  Neurologic Epicritic sensation grossly intact.  Dermatologic No open lesions. Skin normal texture and turgor.  Orthopedic: Pain to palpation L dorsal TMTs with palpable osteophytes.   Assessment & Plan:  Patient was evaluated and treated and all questions answered.  Midfoot Arthritis R -Reinjection as below. -Discussed possible surgical intervention consisting of midfoot dorsal bossing exostectomy should pain continue  Procedure: Joint Injection Location: Left dorsal TMT joint Skin Prep: Alcohol. Injectate: 0.5 cc 1% lidocaine plain, 0.5 cc dexamethasone phosphate. Disposition: Patient tolerated procedure well. Injection site dressed with a band-aid.    Return in about 6 weeks (around 02/07/2018) for Arthritis midfoot.

## 2018-01-06 ENCOUNTER — Ambulatory Visit: Payer: BLUE CROSS/BLUE SHIELD | Admitting: Podiatry

## 2018-02-16 ENCOUNTER — Ambulatory Visit: Payer: BLUE CROSS/BLUE SHIELD | Admitting: Podiatry

## 2018-02-17 ENCOUNTER — Encounter: Payer: Self-pay | Admitting: Podiatry

## 2018-02-17 ENCOUNTER — Ambulatory Visit: Payer: Medicare Other | Admitting: Podiatry

## 2018-02-17 DIAGNOSIS — M19079 Primary osteoarthritis, unspecified ankle and foot: Secondary | ICD-10-CM

## 2018-02-17 DIAGNOSIS — M659 Synovitis and tenosynovitis, unspecified: Secondary | ICD-10-CM

## 2018-02-17 MED ORDER — DICLOFENAC SODIUM 1 % TD GEL: 2.0000 g | Freq: Four times a day (QID) | TRANSDERMAL | 1 refills | Status: DC

## 2018-02-17 NOTE — Progress Notes (Signed)
  Subjective:  Patient ID: [name], female    DOB: 1951/09/01,  MRN: [id number]  Chief Complaint  Patient presents with  . Foot Pain    Follow up midfoot arthritis right   "The shot did help, but I got this big red raised area afterwards"   67 y.o. female returns for the above complaint. Pain is better after injection but did notice what sounds like a vesicular rash post-injection. Now doing better.  Objective:  There were no vitals filed for this visit. General AA&O x3. Normal mood and affect.  Vascular Pedal pulses palpable.  Neurologic Epicritic sensation grossly intact.  Dermatologic Small stained area dorsal TMTs. Skin normal texture and turgor.   Orthopedic: Pain to palpation L dorsal TMTs with palpable osteophytes.   Assessment & Plan:  Patient was evaluated and treated and all questions answered.  Midfoot Arthritis R -No injeciton today, possible allergic reaction last visit. -Rx Voltaren gel -Discussed calling back as needed for injection. -Would consider midfoot exostectomy if injections fail to alleviate symptoms.    Return if symptoms worsen or fail to improve.

## 2020-03-27 ENCOUNTER — Ambulatory Visit: Payer: BLUE CROSS/BLUE SHIELD | Admitting: Orthopaedic Surgery

## 2020-04-10 ENCOUNTER — Other Ambulatory Visit: Payer: Self-pay

## 2020-04-10 ENCOUNTER — Ambulatory Visit: Payer: Self-pay

## 2020-04-10 ENCOUNTER — Ambulatory Visit: Payer: Medicare Other | Admitting: Orthopaedic Surgery

## 2020-04-10 DIAGNOSIS — G8929 Other chronic pain: Secondary | ICD-10-CM | POA: Diagnosis not present

## 2020-04-10 DIAGNOSIS — M5442 Lumbago with sciatica, left side: Secondary | ICD-10-CM

## 2020-04-10 MED ORDER — METHYLPREDNISOLONE 4 MG PO TABS: ORAL_TABLET | ORAL | 0 refills | Status: DC

## 2020-04-10 MED ORDER — GABAPENTIN 300 MG PO CAPS: 300.0000 mg | ORAL_CAPSULE | Freq: Every day | ORAL | 0 refills | Status: DC

## 2020-04-10 NOTE — Progress Notes (Signed)
Office Visit Note   Patient: Amy Kerr           Date of Birth: [birth year]           MRN: [id number] Visit Date: 04/10/2020              Requested by: [name], MD [address] [place],  [place] [address] PCP: [name], MD   Assessment & Plan: Visit Diagnoses:  1. Chronic bilateral low back pain with left-sided sciatica     Plan: I did show back extension exercises on her to try.  Based on her clinical exam and x-ray findings we do need to obtain an MRI of the lumbar spine to rule out any nerve impingement to the left side that is causing these radicular symptoms.  I would try a 6-day steroid taper as well as Neurontin at bedtime.  All question concerns were answered and addressed.  We will see her in follow-up after the MRI.  Follow-Up Instructions: Return in about 3 weeks (around 05/01/2020).   Orders:  Orders Placed This Encounter  Procedures  . XR Lumbar Spine 2-3 Views   Meds ordered this encounter  Medications  . methylPREDNISolone (MEDROL) 4 MG tablet    Sig: Medrol dose pack. Take as instructed    Dispense:  21 tablet    Refill:  0  . gabapentin (NEURONTIN) 300 MG capsule    Sig: Take 1 capsule (300 mg total) by mouth at bedtime.    Dispense:  30 capsule    Refill:  0      Procedures: No procedures performed   Clinical Data: No additional findings.   Subjective: Chief Complaint  Patient presents with  . Lower Back - Pain  . Left Leg - Pain  The patient comes in today with a 1 year history of worsening sciatica to the left side.  It radiates all the way down her leg.  She is not a diabetic.  She has sought treatment for a year now and things are not getting better.  She has not taken any other types of medications for this.  She has tried some stretching exercises and this is not helped.  She says her back hurts in the sciatic region left side which has been standing too long it does wake her up at night.  She only has pain  down the lateral aspect of her left leg but it does radiate all the way up and again to the sciatic region.  She denies any weakness.  She is never had back surgery.  She is a very active 69 years old.  HPI  Review of Systems She currently denies any headache, chest pain, shortness of breath, fever, chills, nausea, vomiting  Objective: Vital Signs: There were no vitals taken for this visit.  Physical Exam She is alert and orient x3 and in no acute distress Ortho Exam Examination of her left lower extremity shows a positive straight leg raise left side.  She has 5 out of 5 strength in all muscle groups but decreased sensation along the lateral aspect of her left leg. Specialty Comments:  No specialty comments available.  Imaging: XR Lumbar Spine 2-3 Views  Result Date: 04/10/2020 2 views of the lumbar spine show no acute findings.  There is significant disc space narrowing between L5 and S1.    PMFS History: Patient Active Problem List   Diagnosis Date Noted  . Acute gangrenous appendicitis 03/23/2013   Past  Medical History:  Diagnosis Date  . Hypertension     No family history on file.  Past Surgical History:  Procedure Laterality Date  . COLONOSCOPY N/A 10/23/2013   Procedure: COLONOSCOPY;  Surgeon: Garlan Fair, MD;  Location: [facility];  Service: Endoscopy;  Laterality: N/A;  . LAPAROSCOPIC APPENDECTOMY N/A 03/22/2013   Procedure: APPENDECTOMY LAPAROSCOPIC;  Surgeon: Haywood Lasso, MD;  Location: [facility];  Service: General;  Laterality: N/A;   Social History   Occupational History  . Not on file  Tobacco Use  . Smoking status: Never Smoker  . Smokeless tobacco: Never Used  Substance and Sexual Activity  . Alcohol use: No  . Drug use: No  . Sexual activity: Not on file

## 2020-04-11 ENCOUNTER — Other Ambulatory Visit: Payer: Self-pay

## 2020-04-11 DIAGNOSIS — M4807 Spinal stenosis, lumbosacral region: Secondary | ICD-10-CM

## 2020-04-30 ENCOUNTER — Ambulatory Visit: Payer: Medicare Other | Admitting: Orthopaedic Surgery

## 2020-05-06 ENCOUNTER — Ambulatory Visit
Admission: RE | Admit: 2020-05-06 | Discharge: 2020-05-06 | Disposition: A | Discharge status: Home / Self Care | Payer: Medicare Other | Source: Ambulatory Visit | Attending: Orthopaedic Surgery | Admitting: Orthopaedic Surgery

## 2020-05-06 ENCOUNTER — Other Ambulatory Visit: Payer: Self-pay

## 2020-05-06 DIAGNOSIS — M4807 Spinal stenosis, lumbosacral region: Secondary | ICD-10-CM

## 2020-05-12 ENCOUNTER — Ambulatory Visit (INDEPENDENT_AMBULATORY_CARE_PROVIDER_SITE_OTHER): Payer: Medicare Other | Admitting: Orthopaedic Surgery

## 2020-05-12 ENCOUNTER — Other Ambulatory Visit: Payer: Self-pay

## 2020-05-12 ENCOUNTER — Encounter: Payer: Self-pay | Admitting: Orthopaedic Surgery

## 2020-05-12 DIAGNOSIS — M5442 Lumbago with sciatica, left side: Secondary | ICD-10-CM

## 2020-05-12 DIAGNOSIS — M4807 Spinal stenosis, lumbosacral region: Secondary | ICD-10-CM

## 2020-05-12 DIAGNOSIS — G8929 Other chronic pain: Secondary | ICD-10-CM

## 2020-05-12 NOTE — Progress Notes (Signed)
The patient comes in today to go over an MRI of her lumbar spine.  Since I expressed the medication such as Neurontin a lot of her radicular symptoms have improved but they are still present on the left side.  She has left-sided low back pain it does radiate into the hip area and sciatic area.  This is somewhat improved but still present.  Is been going on for several years but has been worsened lately.  On exam she is ambulating well in the room without much difficulty at all.  She does hurt in the lower lumbar spine to the left side to palpation and does radiate to the sciatic region.  There is a mildly positive straight leg raise on the left side.  I used a spine model and went over MRI report to describe what they were seeing on the MRI.  She does have multifactorial central and lateral stenosis at L4-L5 and L5-S1.  Seems to be more evident at L4-5 S1 in terms of the foraminal stenosis being quite severe.  There is also facet arthritis and so I think she has a combination of facet joint arthritis as well as nerve impingement at the L4-L5 and L5-S1 1 level that seems to be potentially worse at L5-S1.  At this point she is definitely interested in trying an epidural steroid injection.  We will set this up to Dr. Ernestina Patches to consider a left-sided L5-S1 injection that would be around the nerve versus the L5-S1 facet joint or both.  She understands that certainly some of her symptoms may be from the next level up.  We will continue the medication she is on as well.  All questions and concerns were answered and addressed.  I will see her back in 4 weeks to see how she is doing overall.

## 2020-05-13 ENCOUNTER — Other Ambulatory Visit: Payer: Self-pay | Admitting: Orthopaedic Surgery

## 2020-05-13 NOTE — Telephone Encounter (Signed)
Ok to refill 

## 2020-06-03 ENCOUNTER — Other Ambulatory Visit: Payer: Self-pay | Admitting: Internal Medicine

## 2020-06-03 DIAGNOSIS — M8580 Other specified disorders of bone density and structure, unspecified site: Secondary | ICD-10-CM

## 2020-06-04 ENCOUNTER — Ambulatory Visit (INDEPENDENT_AMBULATORY_CARE_PROVIDER_SITE_OTHER): Payer: Medicare Other | Admitting: Physical Medicine and Rehabilitation

## 2020-06-04 ENCOUNTER — Other Ambulatory Visit: Payer: Self-pay

## 2020-06-04 ENCOUNTER — Ambulatory Visit: Payer: Self-pay

## 2020-06-04 ENCOUNTER — Encounter: Payer: Self-pay | Admitting: Physical Medicine and Rehabilitation

## 2020-06-04 VITALS — BP 183/98 | HR 77

## 2020-06-04 DIAGNOSIS — M5416 Radiculopathy, lumbar region: Secondary | ICD-10-CM | POA: Diagnosis not present

## 2020-06-04 MED ORDER — METHYLPREDNISOLONE ACETATE 80 MG/ML IJ SUSP
80.0000 mg | Freq: Once | INTRAMUSCULAR | Status: AC
  Administered 2020-06-04: 80 mg

## 2020-06-04 NOTE — Progress Notes (Signed)
Pt state lower back pain. Pt state that she mostly feel pain during the night we she laying down. Pt state pain in left groin that gets worse as she tries to stand up. Pt state standing at the sink or cooking she feels a numbness in her back.  Numeric Pain Rating Scale and Functional Assessment Average Pain 2   In the last MONTH (on 0-10 scale) has pain interfered with the following?  1. General activity like being  able to carry out your everyday physical activities such as walking, climbing stairs, carrying groceries, or moving a chair?  Rating(8)   +Driver, -BT, -Dye Allergies.

## 2020-06-10 ENCOUNTER — Encounter: Payer: Self-pay | Admitting: Orthopaedic Surgery

## 2020-06-10 ENCOUNTER — Ambulatory Visit (INDEPENDENT_AMBULATORY_CARE_PROVIDER_SITE_OTHER): Payer: Medicare Other | Admitting: Orthopaedic Surgery

## 2020-06-10 DIAGNOSIS — G8929 Other chronic pain: Secondary | ICD-10-CM

## 2020-06-10 DIAGNOSIS — M4807 Spinal stenosis, lumbosacral region: Secondary | ICD-10-CM | POA: Diagnosis not present

## 2020-06-10 DIAGNOSIS — M5442 Lumbago with sciatica, left side: Secondary | ICD-10-CM | POA: Diagnosis not present

## 2020-06-10 NOTE — Progress Notes (Signed)
The patient is now a week out from a lumbar spinal intervention by Dr. Ernestina Patches to the left side.  She reports improvement in her symptoms overall.  She is still having some groin pain but she can stand and walk much better overall.  She does use a cane to ambulate.  She denies any weakness in the right lower extremity or left lower extremity and denies any significant numbness or tingling.  On exam she has negative straight leg raise to the left side.  She does report some left groin pain but I can put her left hip through internal and external rotation with no discomfort at all.  She is satisfied the scar about how her back is done with the injection by Dr. Ernestina Patches.  All questions and concerns were answered and addressed.  I did offer outpatient physical therapy but she defers this for now based on how she is doing.  Follow-up can be as needed.  If things worsen at all or she like to consider another injection she knows to let us know.

## 2020-06-21 ENCOUNTER — Other Ambulatory Visit: Payer: Self-pay | Admitting: Orthopaedic Surgery

## 2020-06-21 NOTE — Procedures (Signed)
Lumbosacral Transforaminal Epidural Steroid Injection - Sub-Pedicular Approach with Fluoroscopic Guidance  Patient: Amy Kerr      Date of Birth: 1950-11-03 MRN: [id number] PCP: Patient, No Pcp Per      Visit Date: 06/04/2020   Universal Protocol:    Date/Time: 06/04/2020  Consent Given By: the patient  Position: PRONE  Additional Comments: Vital signs were monitored before and after the procedure. Patient was prepped and draped in the usual sterile fashion. The correct patient, procedure, and site was verified.   Injection Procedure Details:  Procedure Site One Meds Administered:  Meds ordered this encounter  Medications  . methylPREDNISolone acetate (DEPO-MEDROL) injection 80 mg    Laterality: Left  Location/Site:  L4-L5  Needle size: 22 G  Needle type: Spinal  Needle Placement: Transforaminal  Findings:    -Comments: Excellent flow of contrast along the nerve, nerve root and into the epidural space.  Procedure Details: After squaring off the end-plates to get a true AP view, the C-arm was positioned so that an oblique view of the foramen as noted above was visualized. The target area is just inferior to the "nose of the scotty dog" or sub pedicular. The soft tissues overlying this structure were infiltrated with 2-3 ml. of 1% Lidocaine without Epinephrine.  The spinal needle was inserted toward the target using a "trajectory" view along the fluoroscope beam.  Under AP and lateral visualization, the needle was advanced so it did not puncture dura and was located close the 6 O'Clock position of the pedical in AP tracterory. Biplanar projections were used to confirm position. Aspiration was confirmed to be negative for CSF and/or blood. A 1-2 ml. volume of Isovue-250 was injected and flow of contrast was noted at each level. Radiographs were obtained for documentation purposes.   After attaining the desired flow of contrast documented above, a 0.5 to 1.0 ml test  dose of 0.25% Marcaine was injected into each respective transforaminal space.  The patient was observed for 90 seconds post injection.  After no sensory deficits were reported, and normal lower extremity motor function was noted,   the above injectate was administered so that equal amounts of the injectate were placed at each foramen (level) into the transforaminal epidural space.   Additional Comments:  The patient tolerated the procedure well Dressing: 2 x 2 sterile gauze and Band-Aid    Post-procedure details: Patient was observed during the procedure. Post-procedure instructions were reviewed.  Patient left the clinic in stable condition.

## 2020-06-21 NOTE — Progress Notes (Signed)
Amy Kerr - 68 y.o. female MRN [id number]  Date of birth: [birth year]  Office Visit Note: Visit Date: 06/04/2020 PCP: Patient, No Pcp Per Referred by: Mcarthur Rossetti*  Subjective: No chief complaint on file.  HPI:  Amy Kerr is a 69 y.o. female who comes in today at the request of Dr. Jean Rosenthal for planned Left L4-L5 Lumbar epidural steroid injection with fluoroscopic guidance.  The patient has failed conservative care including home exercise, medications, time and activity modification.  This injection will be diagnostic and hopefully therapeutic.  Please see requesting physician notes for further details and justification.   On the pain in the left lower back but radiating to the left groin worse with going from sit to stand and worse at night. MRI reviewed with images and spine model.  MRI reviewed in the note below.   ROS Otherwise per HPI.  Assessment & Plan: Visit Diagnoses:  1. Lumbar radiculopathy     Plan: No additional findings.   Meds & Orders:  Meds ordered this encounter  Medications  . methylPREDNISolone acetate (DEPO-MEDROL) injection 80 mg    Orders Placed This Encounter  Procedures  . XR C-ARM NO REPORT  . Epidural Steroid injection    Follow-up: Return for visit to requesting physician as needed.   Procedures: No procedures performed  Lumbosacral Transforaminal Epidural Steroid Injection - Sub-Pedicular Approach with Fluoroscopic Guidance  Patient: Amy Kerr      Date of Birth: 02/05/1951 MRN: [id number] PCP: Patient, No Pcp Per      Visit Date: 06/04/2020   Universal Protocol:    Date/Time: 06/04/2020  Consent Given By: the patient  Position: PRONE  Additional Comments: Vital signs were monitored before and after the procedure. Patient was prepped and draped in the usual sterile fashion. The correct patient, procedure, and site was verified.   Injection Procedure Details:  Procedure Site One Meds  Administered:  Meds ordered this encounter  Medications  . methylPREDNISolone acetate (DEPO-MEDROL) injection 80 mg    Laterality: Left  Location/Site:  L4-L5  Needle size: 22 G  Needle type: Spinal  Needle Placement: Transforaminal  Findings:    -Comments: Excellent flow of contrast along the nerve, nerve root and into the epidural space.  Procedure Details: After squaring off the end-plates to get a true AP view, the C-arm was positioned so that an oblique view of the foramen as noted above was visualized. The target area is just inferior to the "nose of the scotty dog" or sub pedicular. The soft tissues overlying this structure were infiltrated with 2-3 ml. of 1% Lidocaine without Epinephrine.  The spinal needle was inserted toward the target using a "trajectory" view along the fluoroscope beam.  Under AP and lateral visualization, the needle was advanced so it did not puncture dura and was located close the 6 O'Clock position of the pedical in AP tracterory. Biplanar projections were used to confirm position. Aspiration was confirmed to be negative for CSF and/or blood. A 1-2 ml. volume of Isovue-250 was injected and flow of contrast was noted at each level. Radiographs were obtained for documentation purposes.   After attaining the desired flow of contrast documented above, a 0.5 to 1.0 ml test dose of 0.25% Marcaine was injected into each respective transforaminal space.  The patient was observed for 90 seconds post injection.  After no sensory deficits were reported, and normal lower extremity motor function was noted,   the above injectate was administered so that  equal amounts of the injectate were placed at each foramen (level) into the transforaminal epidural space.   Additional Comments:  The patient tolerated the procedure well Dressing: 2 x 2 sterile gauze and Band-Aid    Post-procedure details: Patient was observed during the procedure. Post-procedure instructions  were reviewed.  Patient left the clinic in stable condition.      Clinical History: MRI LUMBAR SPINE WITHOUT CONTRAST  TECHNIQUE: Multiplanar, multisequence MR imaging of the lumbar spine was performed. No intravenous contrast was administered.  COMPARISON:  Lumbar spine radiographs 04/10/2020  FINDINGS: Segmentation: 5 lumbar vertebrae when correlating with prior lumbar spine radiographs performed 04/10/2020.  Alignment: Lumbar levocurvature. 2 mm L5-S1 grade 1 anterolisthesis.  Vertebrae: Vertebral body height is maintained. No significant osseous lesion or significant marrow edema.  Conus medullaris and cauda equina: Conus extends to the L1-L2 level. No signal abnormality identified within the visualized distal spinal cord.  Paraspinal and other soft tissues: Bilateral renal sinus cysts. Lumbar paraspinal soft tissues within normal limits.  Disc levels:  Mild/moderate L5-S1 disc degeneration. No more than mild disc degeneration at the remaining levels.  T12-L1: No significant disc herniation or stenosis.  L1-L2: Mild facet arthrosis. No significant disc herniation or stenosis.  L2-L3: Minimal disc bulge. Superimposed small right foraminal disc protrusion. Mild facet arthrosis. No significant spinal canal stenosis or neural foraminal narrowing.  L3-L4: Minimal disc bulge. Mild facet arthrosis/ligamentum flavum hypertrophy. No significant spinal canal stenosis or neural foraminal narrowing.  L4-L5: Disc bulge with mild endplate spurring. Moderate facet arthrosis with ligamentum flavum hypertrophy. Moderately severe central canal stenosis with bilateral subarticular stenosis. Bilateral neural foraminal narrowing (mild/moderate right, moderate left).  L5-S1: Mild grade 1 anterolisthesis. Disc uncovering with disc bulge and endplate spurring. Moderate facet arthrosis with ligamentum flavum hypertrophy. Mild bilateral subarticular narrowing.  Bilateral neural foraminal narrowing (mild right, severe left).  IMPRESSION: Lumbar spondylosis as outlined and most notably as follows.  At L4-L5, there is mild disc degeneration. Disc bulge with mild endplate spurring. Moderate facet arthrosis with ligamentum flavum hypertrophy. Moderately severe central canal stenosis with bilateral subarticular stenosis. Bilateral neural foraminal narrowing (mild/moderate right, moderate left).  At L5-S1, there is mild grade 1 anterolisthesis. Mild/moderate disc degeneration. Disc uncovering with disc bulge and endplate spurring. Moderate facet arthrosis with ligamentum flavum hypertrophy. Mild bilateral subarticular narrowing. Bilateral neural foraminal narrowing (mild right, severe left).   Electronically Signed   By: Kellie Simmering DO   On: 05/06/2020 07:43     Objective:  VS:  HT:    WT:   BMI:     BP:(!) 183/98  HR:77bpm  TEMP: ( )  RESP:  Physical Exam Constitutional:      General: She is not in acute distress.    Appearance: Normal appearance. She is not ill-appearing.  HENT:     Head: Normocephalic and atraumatic.     Right Ear: External ear normal.     Left Ear: External ear normal.  Eyes:     Extraocular Movements: Extraocular movements intact.  Cardiovascular:     Rate and Rhythm: Normal rate.     Pulses: Normal pulses.  Musculoskeletal:     Right lower leg: No edema.     Left lower leg: No edema.     Comments: Patient has good distal strength with no pain over the greater trochanters.  No clonus or focal weakness.  Skin:    Findings: No erythema, lesion or rash.  Neurological:     General: No focal deficit  present.     Mental Status: She is alert and oriented to person, place, and time.     Sensory: No sensory deficit.     Motor: No weakness or abnormal muscle tone.     Coordination: Coordination normal.  Psychiatric:        Mood and Affect: Mood normal.        Behavior: Behavior normal.       Imaging: No results found.

## 2020-06-23 NOTE — Telephone Encounter (Signed)
Ok to refill 

## 2020-08-11 ENCOUNTER — Ambulatory Visit: Payer: Self-pay

## 2020-08-11 ENCOUNTER — Ambulatory Visit (INDEPENDENT_AMBULATORY_CARE_PROVIDER_SITE_OTHER): Payer: Medicare Other | Admitting: Orthopaedic Surgery

## 2020-08-11 ENCOUNTER — Encounter: Payer: Self-pay | Admitting: Orthopaedic Surgery

## 2020-08-11 DIAGNOSIS — M25552 Pain in left hip: Secondary | ICD-10-CM

## 2020-08-11 NOTE — Progress Notes (Signed)
Subjective: Patient is here for ultrasound-guided intra-articular left hip injection.   Groin pain and leg pain.  Objective:  Pain with passive IR.  Procedure: Ultrasound guided injection is preferred based studies that show increased duration, increased effect, greater accuracy, decreased procedural pain, increased response rate, and decreased cost with ultrasound guided versus blind injection.   Verbal informed consent obtained.  Time-out conducted.  Noted no overlying erythema, induration, or other signs of local infection. Ultrasound-guided left hip injection: After sterile prep with Betadine, injected 8 cc 1% lidocaine without epinephrine and 40 mg methylprednisolone using a 22-gauge spinal needle, passing the needle through the iliofemoral ligament into the femoral head/neck junction.  Injectate seen filling joint capsule.  Not a lot of immediate relief.

## 2020-08-11 NOTE — Progress Notes (Signed)
The patient comes in today with left hip pain.  She had actually had an epidural steroid injection previously to the left side at L4-L5 by Dr. Ernestina Patches.  This did decrease her symptoms significantly but from about 3 days.  She comes today reporting radicular type of pain going down her left leg but some of this is in the groin as well.  There is been no acute change in medical status.  She denies any headache, chest pain, shortness of breath, fever, chills, nausea, vomiting.  There is no numbness and tingling in her left foot.  On examination, she has negative straight leg raise to the left side.  There is some slight pain in the groin with internal and external rotation of the left hip but her left hip moves smoothly and fluidly.  She points to the medial aspect of her thigh and then to the lateral aspect of her left leg as a source of her pain.  I would like to see if Dr. Junius Roads can provide a diagnostic and therapeutic injection in the left hip joint today with a steroid under ultrasound.  This could help [place] better get an idea of what the driving source of her pain is.  I would then like to see her back about 2 weeks after his injection to see if that is helped.  If she still having some discomfort, we will need an AP pelvis and lateral of her left hip.  She will just take over-the-counter anti-inflammatories for now as well.

## 2020-08-14 ENCOUNTER — Other Ambulatory Visit: Payer: Self-pay | Admitting: Orthopaedic Surgery

## 2020-09-03 ENCOUNTER — Other Ambulatory Visit: Payer: Medicare Other

## 2020-09-22 ENCOUNTER — Telehealth: Payer: Self-pay

## 2020-09-22 ENCOUNTER — Other Ambulatory Visit: Payer: Self-pay | Admitting: Orthopaedic Surgery

## 2020-09-22 MED ORDER — TRAMADOL HCL 50 MG PO TABS: 100.0000 mg | ORAL_TABLET | Freq: Four times a day (QID) | ORAL | 0 refills | Status: DC | PRN

## 2020-09-22 NOTE — Telephone Encounter (Signed)
Pt called and informed and stated she would try it

## 2020-09-22 NOTE — Telephone Encounter (Signed)
Patient called stating that she is having some leg pain again.  Stated that it was doing fine until Friday.  Would like to know if she needs to be worked in or if something can be sent to her pharmacy.  Cb# [phone].  Please advise.  Thank you.

## 2020-09-22 NOTE — Telephone Encounter (Signed)
I sent in some tramadol for her pain.  There is no way we can work her in today due to the schedule being too packed.  We can certainly see her in a couple weeks if needed.

## 2020-10-08 ENCOUNTER — Encounter: Payer: Self-pay | Admitting: Orthopaedic Surgery

## 2020-10-08 ENCOUNTER — Ambulatory Visit: Payer: Self-pay

## 2020-10-08 ENCOUNTER — Ambulatory Visit: Payer: Medicare Other | Admitting: Orthopaedic Surgery

## 2020-10-08 DIAGNOSIS — M25552 Pain in left hip: Secondary | ICD-10-CM | POA: Diagnosis not present

## 2020-10-08 MED ORDER — METHYLPREDNISOLONE 4 MG PO TABS: ORAL_TABLET | ORAL | 0 refills | Status: DC

## 2020-10-08 MED ORDER — GABAPENTIN 300 MG PO CAPS
300.0000 mg | ORAL_CAPSULE | Freq: Three times a day (TID) | ORAL | 0 refills | Status: DC
Start: 2020-10-08 — End: 2020-11-04

## 2020-10-08 MED ORDER — ACETAMINOPHEN-CODEINE #3 300-30 MG PO TABS: 1.0000 | ORAL_TABLET | Freq: Three times a day (TID) | ORAL | 0 refills | Status: DC | PRN

## 2020-10-08 NOTE — Progress Notes (Signed)
The patient comes in today with continued left-sided low back pain and radicular symptoms going down her entire left leg.  Most of her pain is past the knee and on the lateral aspect of her leg.  She is an active 70 year old female.  This is waking her up at night and getting worse.  She does have a little bit of pain in the groin on the left side and some knee pain.  She has been on Neurontin before but just 300 mg at night and when this was not helping her she stopped this.  A MRI of her lumbar spine in August of this year did show quite severe stenosis centrally at L4-L5.  On examination of her left hip there is some pain in the groin with internal or external rotation but is very mild her rotation is full with no blocks to rotation.  Her left knee exam is also normal.  She does have a positive straight leg raise to the left side.  There is subjective decrease sensation in the lateral aspect of her left leg as well.  I did look at the MRI again with her.  This was of the lumbar spine.  I think this is where more of her issues are coming from.  An AP pelvis and lateral left hip showed some mild arthritic changes with some particular osteophytes and slight joint space narrowing but not severe.  I would like to send her to [facility] for evaluation given the severity of her stenosis and radicular symptoms that are affecting her left lower extremity.  I want to put her back on Neurontin but I want her to increase to twice a day and then up to 3 times a day after a week if she tolerates it well.  I will also put her on a 6-day steroid taper and occasional Tylenol 3.  We will work on making that referral for her.  All questions and concerns were answered and addressed.

## 2020-10-09 ENCOUNTER — Other Ambulatory Visit: Payer: Self-pay

## 2020-10-09 DIAGNOSIS — M4807 Spinal stenosis, lumbosacral region: Secondary | ICD-10-CM

## 2020-10-09 DIAGNOSIS — M5442 Lumbago with sciatica, left side: Secondary | ICD-10-CM

## 2020-10-09 DIAGNOSIS — G8929 Other chronic pain: Secondary | ICD-10-CM

## 2020-10-15 ENCOUNTER — Telehealth: Payer: Self-pay | Admitting: Orthopaedic Surgery

## 2020-10-15 NOTE — Telephone Encounter (Signed)
That will be fine since she is currently under our medical care with musculoskeletal issues that will keep her from being able to sit for long periods of time. She is also on narcotic pain medications which can affect judgment.

## 2020-10-15 NOTE — Telephone Encounter (Signed)
Pt called and needs to speak with Caryl Pina about a jury duty note?

## 2020-10-15 NOTE — Telephone Encounter (Signed)
Ok to give her one.

## 2020-10-16 NOTE — Telephone Encounter (Signed)
Patient aware note ready for her at the front desk for her husband

## 2020-10-21 DIAGNOSIS — M4807 Spinal stenosis, lumbosacral region: Secondary | ICD-10-CM | POA: Diagnosis not present

## 2020-10-31 DIAGNOSIS — M4807 Spinal stenosis, lumbosacral region: Secondary | ICD-10-CM | POA: Diagnosis not present

## 2020-10-31 DIAGNOSIS — M25552 Pain in left hip: Secondary | ICD-10-CM | POA: Diagnosis not present

## 2020-11-04 ENCOUNTER — Other Ambulatory Visit: Payer: Self-pay | Admitting: Orthopaedic Surgery

## 2020-11-05 DIAGNOSIS — M4807 Spinal stenosis, lumbosacral region: Secondary | ICD-10-CM | POA: Diagnosis not present

## 2020-11-05 DIAGNOSIS — M25552 Pain in left hip: Secondary | ICD-10-CM | POA: Diagnosis not present

## 2020-11-13 DIAGNOSIS — H43813 Vitreous degeneration, bilateral: Secondary | ICD-10-CM | POA: Diagnosis not present

## 2020-11-13 DIAGNOSIS — L720 Epidermal cyst: Secondary | ICD-10-CM | POA: Diagnosis not present

## 2020-11-13 DIAGNOSIS — H04123 Dry eye syndrome of bilateral lacrimal glands: Secondary | ICD-10-CM | POA: Diagnosis not present

## 2020-11-13 DIAGNOSIS — Z961 Presence of intraocular lens: Secondary | ICD-10-CM | POA: Diagnosis not present

## 2020-11-26 ENCOUNTER — Other Ambulatory Visit: Payer: Medicare Other

## 2020-12-02 DIAGNOSIS — Z0000 Encounter for general adult medical examination without abnormal findings: Secondary | ICD-10-CM | POA: Diagnosis not present

## 2020-12-02 DIAGNOSIS — E559 Vitamin D deficiency, unspecified: Secondary | ICD-10-CM | POA: Diagnosis not present

## 2020-12-02 DIAGNOSIS — R7309 Other abnormal glucose: Secondary | ICD-10-CM | POA: Diagnosis not present

## 2020-12-02 DIAGNOSIS — J309 Allergic rhinitis, unspecified: Secondary | ICD-10-CM | POA: Diagnosis not present

## 2020-12-02 DIAGNOSIS — N1831 Chronic kidney disease, stage 3a: Secondary | ICD-10-CM | POA: Diagnosis not present

## 2020-12-02 DIAGNOSIS — M8580 Other specified disorders of bone density and structure, unspecified site: Secondary | ICD-10-CM | POA: Diagnosis not present

## 2020-12-02 DIAGNOSIS — E782 Mixed hyperlipidemia: Secondary | ICD-10-CM | POA: Diagnosis not present

## 2020-12-02 DIAGNOSIS — M5136 Other intervertebral disc degeneration, lumbar region: Secondary | ICD-10-CM | POA: Diagnosis not present

## 2020-12-02 DIAGNOSIS — I493 Ventricular premature depolarization: Secondary | ICD-10-CM | POA: Diagnosis not present

## 2020-12-02 DIAGNOSIS — I10 Essential (primary) hypertension: Secondary | ICD-10-CM | POA: Diagnosis not present

## 2020-12-02 DIAGNOSIS — M4807 Spinal stenosis, lumbosacral region: Secondary | ICD-10-CM | POA: Diagnosis not present

## 2020-12-02 DIAGNOSIS — Z1389 Encounter for screening for other disorder: Secondary | ICD-10-CM | POA: Diagnosis not present

## 2020-12-08 DIAGNOSIS — L814 Other melanin hyperpigmentation: Secondary | ICD-10-CM | POA: Diagnosis not present

## 2020-12-08 DIAGNOSIS — L738 Other specified follicular disorders: Secondary | ICD-10-CM | POA: Diagnosis not present

## 2020-12-08 DIAGNOSIS — D225 Melanocytic nevi of trunk: Secondary | ICD-10-CM | POA: Diagnosis not present

## 2020-12-08 DIAGNOSIS — L821 Other seborrheic keratosis: Secondary | ICD-10-CM | POA: Diagnosis not present

## 2020-12-08 DIAGNOSIS — D485 Neoplasm of uncertain behavior of skin: Secondary | ICD-10-CM | POA: Diagnosis not present

## 2020-12-31 DIAGNOSIS — D485 Neoplasm of uncertain behavior of skin: Secondary | ICD-10-CM | POA: Diagnosis not present

## 2020-12-31 DIAGNOSIS — L988 Other specified disorders of the skin and subcutaneous tissue: Secondary | ICD-10-CM | POA: Diagnosis not present

## 2021-01-07 ENCOUNTER — Other Ambulatory Visit: Payer: Self-pay | Admitting: Orthopaedic Surgery

## 2021-01-19 DIAGNOSIS — L900 Lichen sclerosus et atrophicus: Secondary | ICD-10-CM | POA: Diagnosis not present

## 2021-01-19 DIAGNOSIS — Z1231 Encounter for screening mammogram for malignant neoplasm of breast: Secondary | ICD-10-CM | POA: Diagnosis not present

## 2021-01-23 ENCOUNTER — Other Ambulatory Visit: Payer: Medicare Other

## 2021-02-02 DIAGNOSIS — M8589 Other specified disorders of bone density and structure, multiple sites: Secondary | ICD-10-CM | POA: Diagnosis not present

## 2021-02-03 DIAGNOSIS — R1011 Right upper quadrant pain: Secondary | ICD-10-CM | POA: Diagnosis not present

## 2021-02-03 DIAGNOSIS — K828 Other specified diseases of gallbladder: Secondary | ICD-10-CM | POA: Diagnosis not present

## 2021-02-04 DIAGNOSIS — R1011 Right upper quadrant pain: Secondary | ICD-10-CM | POA: Diagnosis not present

## 2021-02-09 DIAGNOSIS — R109 Unspecified abdominal pain: Secondary | ICD-10-CM | POA: Diagnosis not present

## 2021-02-09 DIAGNOSIS — R110 Nausea: Secondary | ICD-10-CM | POA: Diagnosis not present

## 2021-02-10 ENCOUNTER — Other Ambulatory Visit: Payer: Self-pay | Admitting: Internal Medicine

## 2021-02-10 DIAGNOSIS — R109 Unspecified abdominal pain: Secondary | ICD-10-CM

## 2021-02-24 ENCOUNTER — Ambulatory Visit
Admission: RE | Admit: 2021-02-24 | Discharge: 2021-02-24 | Disposition: A | Discharge status: Home / Self Care | Payer: Medicare Other | Source: Ambulatory Visit | Attending: Internal Medicine | Admitting: Internal Medicine

## 2021-02-24 DIAGNOSIS — N281 Cyst of kidney, acquired: Secondary | ICD-10-CM | POA: Diagnosis not present

## 2021-02-24 DIAGNOSIS — R109 Unspecified abdominal pain: Secondary | ICD-10-CM

## 2021-02-24 DIAGNOSIS — K5750 Diverticulosis of both small and large intestine without perforation or abscess without bleeding: Secondary | ICD-10-CM | POA: Diagnosis not present

## 2021-02-24 DIAGNOSIS — R110 Nausea: Secondary | ICD-10-CM | POA: Diagnosis not present

## 2021-02-24 MED ORDER — IOPAMIDOL (ISOVUE-300) INJECTION 61%
100.0000 mL | Freq: Once | INTRAVENOUS | Status: AC | PRN
  Administered 2021-02-24: 100 mL via INTRAVENOUS

## 2021-03-03 DIAGNOSIS — M4807 Spinal stenosis, lumbosacral region: Secondary | ICD-10-CM | POA: Diagnosis not present

## 2021-03-03 DIAGNOSIS — I10 Essential (primary) hypertension: Secondary | ICD-10-CM | POA: Diagnosis not present

## 2021-03-06 DIAGNOSIS — R9389 Abnormal findings on diagnostic imaging of other specified body structures: Secondary | ICD-10-CM | POA: Diagnosis not present

## 2021-03-16 DIAGNOSIS — R9389 Abnormal findings on diagnostic imaging of other specified body structures: Secondary | ICD-10-CM | POA: Diagnosis not present

## 2021-05-15 DIAGNOSIS — M549 Dorsalgia, unspecified: Secondary | ICD-10-CM | POA: Diagnosis not present

## 2021-06-04 DIAGNOSIS — E782 Mixed hyperlipidemia: Secondary | ICD-10-CM | POA: Diagnosis not present

## 2021-06-04 DIAGNOSIS — M5136 Other intervertebral disc degeneration, lumbar region: Secondary | ICD-10-CM | POA: Diagnosis not present

## 2021-06-04 DIAGNOSIS — N1831 Chronic kidney disease, stage 3a: Secondary | ICD-10-CM | POA: Diagnosis not present

## 2021-06-04 DIAGNOSIS — I10 Essential (primary) hypertension: Secondary | ICD-10-CM | POA: Diagnosis not present

## 2021-06-04 DIAGNOSIS — R7303 Prediabetes: Secondary | ICD-10-CM | POA: Diagnosis not present

## 2021-11-13 DIAGNOSIS — H26491 Other secondary cataract, right eye: Secondary | ICD-10-CM | POA: Diagnosis not present

## 2021-11-13 DIAGNOSIS — H04123 Dry eye syndrome of bilateral lacrimal glands: Secondary | ICD-10-CM | POA: Diagnosis not present

## 2021-11-13 DIAGNOSIS — H43813 Vitreous degeneration, bilateral: Secondary | ICD-10-CM | POA: Diagnosis not present

## 2021-11-13 DIAGNOSIS — Z961 Presence of intraocular lens: Secondary | ICD-10-CM | POA: Diagnosis not present

## 2021-11-17 DIAGNOSIS — K219 Gastro-esophageal reflux disease without esophagitis: Secondary | ICD-10-CM | POA: Diagnosis not present

## 2021-11-17 DIAGNOSIS — R059 Cough, unspecified: Secondary | ICD-10-CM | POA: Diagnosis not present

## 2021-11-18 ENCOUNTER — Other Ambulatory Visit: Payer: Self-pay | Admitting: Internal Medicine

## 2021-11-18 DIAGNOSIS — K219 Gastro-esophageal reflux disease without esophagitis: Secondary | ICD-10-CM

## 2021-11-19 ENCOUNTER — Ambulatory Visit
Admission: RE | Admit: 2021-11-19 | Discharge: 2021-11-19 | Disposition: A | Discharge status: Home / Self Care | Payer: Medicare Other | Source: Ambulatory Visit | Attending: Internal Medicine | Admitting: Internal Medicine

## 2021-11-19 DIAGNOSIS — K219 Gastro-esophageal reflux disease without esophagitis: Secondary | ICD-10-CM

## 2021-11-19 DIAGNOSIS — K224 Dyskinesia of esophagus: Secondary | ICD-10-CM | POA: Diagnosis not present

## 2021-12-03 DIAGNOSIS — Z1389 Encounter for screening for other disorder: Secondary | ICD-10-CM | POA: Diagnosis not present

## 2021-12-03 DIAGNOSIS — Z0000 Encounter for general adult medical examination without abnormal findings: Secondary | ICD-10-CM | POA: Diagnosis not present

## 2021-12-03 DIAGNOSIS — E782 Mixed hyperlipidemia: Secondary | ICD-10-CM | POA: Diagnosis not present

## 2021-12-03 DIAGNOSIS — I700 Atherosclerosis of aorta: Secondary | ICD-10-CM | POA: Diagnosis not present

## 2021-12-03 DIAGNOSIS — I493 Ventricular premature depolarization: Secondary | ICD-10-CM | POA: Diagnosis not present

## 2021-12-03 DIAGNOSIS — K219 Gastro-esophageal reflux disease without esophagitis: Secondary | ICD-10-CM | POA: Diagnosis not present

## 2021-12-03 DIAGNOSIS — M8580 Other specified disorders of bone density and structure, unspecified site: Secondary | ICD-10-CM | POA: Diagnosis not present

## 2021-12-03 DIAGNOSIS — J309 Allergic rhinitis, unspecified: Secondary | ICD-10-CM | POA: Diagnosis not present

## 2021-12-03 DIAGNOSIS — E559 Vitamin D deficiency, unspecified: Secondary | ICD-10-CM | POA: Diagnosis not present

## 2021-12-03 DIAGNOSIS — M5136 Other intervertebral disc degeneration, lumbar region: Secondary | ICD-10-CM | POA: Diagnosis not present

## 2021-12-03 DIAGNOSIS — R7303 Prediabetes: Secondary | ICD-10-CM | POA: Diagnosis not present

## 2021-12-03 DIAGNOSIS — N1831 Chronic kidney disease, stage 3a: Secondary | ICD-10-CM | POA: Diagnosis not present

## 2021-12-03 DIAGNOSIS — I10 Essential (primary) hypertension: Secondary | ICD-10-CM | POA: Diagnosis not present

## 2022-01-19 ENCOUNTER — Ambulatory Visit
Admission: RE | Admit: 2022-01-19 | Discharge: 2022-01-19 | Disposition: A | Discharge status: Home / Self Care | Payer: Medicare Other | Source: Ambulatory Visit | Attending: Internal Medicine | Admitting: Internal Medicine

## 2022-01-19 ENCOUNTER — Other Ambulatory Visit: Payer: Self-pay | Admitting: Internal Medicine

## 2022-01-19 DIAGNOSIS — R1011 Right upper quadrant pain: Secondary | ICD-10-CM

## 2022-01-19 DIAGNOSIS — K760 Fatty (change of) liver, not elsewhere classified: Secondary | ICD-10-CM | POA: Diagnosis not present

## 2022-01-22 DIAGNOSIS — R1031 Right lower quadrant pain: Secondary | ICD-10-CM | POA: Diagnosis not present

## 2022-01-22 DIAGNOSIS — Z1231 Encounter for screening mammogram for malignant neoplasm of breast: Secondary | ICD-10-CM | POA: Diagnosis not present

## 2022-01-22 DIAGNOSIS — N816 Rectocele: Secondary | ICD-10-CM | POA: Diagnosis not present

## 2022-02-03 DIAGNOSIS — L821 Other seborrheic keratosis: Secondary | ICD-10-CM | POA: Diagnosis not present

## 2022-02-03 DIAGNOSIS — R1031 Right lower quadrant pain: Secondary | ICD-10-CM | POA: Diagnosis not present

## 2022-02-09 DIAGNOSIS — R1011 Right upper quadrant pain: Secondary | ICD-10-CM | POA: Diagnosis not present

## 2022-02-11 DIAGNOSIS — R1011 Right upper quadrant pain: Secondary | ICD-10-CM | POA: Diagnosis not present

## 2022-02-11 DIAGNOSIS — R110 Nausea: Secondary | ICD-10-CM | POA: Diagnosis not present

## 2022-03-23 ENCOUNTER — Other Ambulatory Visit: Payer: Self-pay | Admitting: Surgery

## 2022-03-23 DIAGNOSIS — K8050 Calculus of bile duct without cholangitis or cholecystitis without obstruction: Secondary | ICD-10-CM | POA: Diagnosis not present

## 2022-03-25 ENCOUNTER — Other Ambulatory Visit: Payer: Self-pay | Admitting: Surgery

## 2022-03-25 ENCOUNTER — Other Ambulatory Visit (HOSPITAL_COMMUNITY): Payer: Self-pay | Admitting: Surgery

## 2022-03-25 DIAGNOSIS — K8050 Calculus of bile duct without cholangitis or cholecystitis without obstruction: Secondary | ICD-10-CM

## 2022-04-15 ENCOUNTER — Ambulatory Visit (HOSPITAL_COMMUNITY)
Admission: RE | Admit: 2022-04-15 | Discharge: 2022-04-15 | Disposition: A | Discharge status: Home / Self Care | Payer: Medicare Other | Source: Ambulatory Visit | Attending: Surgery | Admitting: Surgery

## 2022-04-15 DIAGNOSIS — K8050 Calculus of bile duct without cholangitis or cholecystitis without obstruction: Secondary | ICD-10-CM | POA: Insufficient documentation

## 2022-04-15 DIAGNOSIS — R1011 Right upper quadrant pain: Secondary | ICD-10-CM | POA: Diagnosis not present

## 2022-04-15 MED ORDER — TECHNETIUM TC 99M MEBROFENIN IV KIT
5.3000 | PACK | Freq: Once | INTRAVENOUS | Status: AC | PRN
  Administered 2022-04-15: 5.3 via INTRAVENOUS

## 2022-04-26 ENCOUNTER — Other Ambulatory Visit: Payer: Self-pay | Admitting: Surgery

## 2022-05-18 ENCOUNTER — Other Ambulatory Visit: Payer: Self-pay | Admitting: Surgery

## 2022-05-18 DIAGNOSIS — K828 Other specified diseases of gallbladder: Secondary | ICD-10-CM | POA: Diagnosis not present

## 2022-05-18 DIAGNOSIS — K824 Cholesterolosis of gallbladder: Secondary | ICD-10-CM | POA: Diagnosis not present

## 2022-06-02 DIAGNOSIS — I700 Atherosclerosis of aorta: Secondary | ICD-10-CM | POA: Diagnosis not present

## 2022-06-02 DIAGNOSIS — R7303 Prediabetes: Secondary | ICD-10-CM | POA: Diagnosis not present

## 2022-06-02 DIAGNOSIS — I10 Essential (primary) hypertension: Secondary | ICD-10-CM | POA: Diagnosis not present

## 2022-06-02 DIAGNOSIS — E782 Mixed hyperlipidemia: Secondary | ICD-10-CM | POA: Diagnosis not present

## 2022-06-02 DIAGNOSIS — R059 Cough, unspecified: Secondary | ICD-10-CM | POA: Diagnosis not present

## 2022-06-02 DIAGNOSIS — N1831 Chronic kidney disease, stage 3a: Secondary | ICD-10-CM | POA: Diagnosis not present

## 2022-06-02 DIAGNOSIS — I493 Ventricular premature depolarization: Secondary | ICD-10-CM | POA: Diagnosis not present

## 2022-06-02 DIAGNOSIS — J309 Allergic rhinitis, unspecified: Secondary | ICD-10-CM | POA: Diagnosis not present

## 2022-06-02 DIAGNOSIS — Z9049 Acquired absence of other specified parts of digestive tract: Secondary | ICD-10-CM | POA: Diagnosis not present

## 2022-10-13 ENCOUNTER — Telehealth: Payer: Self-pay | Admitting: Orthopaedic Surgery

## 2022-10-13 NOTE — Telephone Encounter (Signed)
Patient wants someone to call her about Amy Kerr and an appt...[phone]

## 2022-12-06 DIAGNOSIS — N1831 Chronic kidney disease, stage 3a: Secondary | ICD-10-CM | POA: Diagnosis not present

## 2022-12-06 DIAGNOSIS — R7303 Prediabetes: Secondary | ICD-10-CM | POA: Diagnosis not present

## 2022-12-06 DIAGNOSIS — I10 Essential (primary) hypertension: Secondary | ICD-10-CM | POA: Diagnosis not present

## 2022-12-06 DIAGNOSIS — M5136 Other intervertebral disc degeneration, lumbar region: Secondary | ICD-10-CM | POA: Diagnosis not present

## 2022-12-06 DIAGNOSIS — M8580 Other specified disorders of bone density and structure, unspecified site: Secondary | ICD-10-CM | POA: Diagnosis not present

## 2022-12-06 DIAGNOSIS — Z0000 Encounter for general adult medical examination without abnormal findings: Secondary | ICD-10-CM | POA: Diagnosis not present

## 2022-12-06 DIAGNOSIS — K219 Gastro-esophageal reflux disease without esophagitis: Secondary | ICD-10-CM | POA: Diagnosis not present

## 2022-12-06 DIAGNOSIS — I700 Atherosclerosis of aorta: Secondary | ICD-10-CM | POA: Diagnosis not present

## 2022-12-06 DIAGNOSIS — E559 Vitamin D deficiency, unspecified: Secondary | ICD-10-CM | POA: Diagnosis not present

## 2022-12-06 DIAGNOSIS — E782 Mixed hyperlipidemia: Secondary | ICD-10-CM | POA: Diagnosis not present

## 2022-12-16 DIAGNOSIS — R0789 Other chest pain: Secondary | ICD-10-CM | POA: Diagnosis not present

## 2022-12-16 DIAGNOSIS — R1013 Epigastric pain: Secondary | ICD-10-CM | POA: Diagnosis not present

## 2022-12-21 ENCOUNTER — Ambulatory Visit: Payer: Medicare Other | Admitting: Cardiology

## 2022-12-21 ENCOUNTER — Other Ambulatory Visit: Payer: Self-pay

## 2022-12-21 ENCOUNTER — Encounter: Payer: Self-pay | Admitting: Cardiology

## 2022-12-21 ENCOUNTER — Other Ambulatory Visit (HOSPITAL_COMMUNITY): Payer: Self-pay

## 2022-12-21 ENCOUNTER — Encounter (HOSPITAL_COMMUNITY)
Admission: AD | Disposition: A | Discharge status: Home / Self Care | Payer: Self-pay | Source: Home / Self Care | Attending: Cardiology

## 2022-12-21 ENCOUNTER — Encounter (HOSPITAL_COMMUNITY): Payer: Self-pay

## 2022-12-21 ENCOUNTER — Encounter (HOSPITAL_COMMUNITY): Payer: Self-pay | Admitting: Cardiology

## 2022-12-21 ENCOUNTER — Ambulatory Visit (HOSPITAL_COMMUNITY)
Admission: AD | Admit: 2022-12-21 | Discharge: 2022-12-22 | Disposition: A | Discharge status: Home / Self Care | Payer: Medicare Other | Attending: Cardiology | Admitting: Cardiology

## 2022-12-21 VITALS — BP 156/83 | HR 85 | Ht 64.0 in | Wt 190.0 lb

## 2022-12-21 DIAGNOSIS — E6609 Other obesity due to excess calories: Secondary | ICD-10-CM | POA: Diagnosis not present

## 2022-12-21 DIAGNOSIS — I25110 Atherosclerotic heart disease of native coronary artery with unstable angina pectoris: Secondary | ICD-10-CM | POA: Diagnosis not present

## 2022-12-21 DIAGNOSIS — Z9861 Coronary angioplasty status: Secondary | ICD-10-CM

## 2022-12-21 DIAGNOSIS — I700 Atherosclerosis of aorta: Secondary | ICD-10-CM | POA: Diagnosis not present

## 2022-12-21 DIAGNOSIS — I2510 Atherosclerotic heart disease of native coronary artery without angina pectoris: Secondary | ICD-10-CM

## 2022-12-21 DIAGNOSIS — K219 Gastro-esophageal reflux disease without esophagitis: Secondary | ICD-10-CM | POA: Insufficient documentation

## 2022-12-21 DIAGNOSIS — R079 Chest pain, unspecified: Secondary | ICD-10-CM

## 2022-12-21 DIAGNOSIS — I200 Unstable angina: Secondary | ICD-10-CM

## 2022-12-21 DIAGNOSIS — E782 Mixed hyperlipidemia: Secondary | ICD-10-CM

## 2022-12-21 DIAGNOSIS — I10 Essential (primary) hypertension: Secondary | ICD-10-CM | POA: Diagnosis not present

## 2022-12-21 DIAGNOSIS — Z955 Presence of coronary angioplasty implant and graft: Secondary | ICD-10-CM

## 2022-12-21 DIAGNOSIS — Z8249 Family history of ischemic heart disease and other diseases of the circulatory system: Secondary | ICD-10-CM | POA: Insufficient documentation

## 2022-12-21 DIAGNOSIS — Z95820 Peripheral vascular angioplasty status with implants and grafts: Secondary | ICD-10-CM | POA: Diagnosis not present

## 2022-12-21 DIAGNOSIS — Z6832 Body mass index (BMI) 32.0-32.9, adult: Secondary | ICD-10-CM | POA: Insufficient documentation

## 2022-12-21 HISTORY — PX: LEFT HEART CATH AND CORONARY ANGIOGRAPHY: CATH118249

## 2022-12-21 HISTORY — PX: CORONARY STENT INTERVENTION: CATH118234

## 2022-12-21 LAB — BASIC METABOLIC PANEL
Anion gap: 9 (ref 5–15)
BUN: 12 mg/dL (ref 8–23)
CO2: 23 mmol/L (ref 22–32)
Calcium: 8.6 mg/dL — ABNORMAL LOW (ref 8.9–10.3)
Chloride: 108 mmol/L (ref 98–111)
Creatinine, Ser: 0.99 mg/dL (ref 0.44–1.00)
GFR, Estimated: >60 mL/min (ref >60)
Glucose, Bld: 101 mg/dL — ABNORMAL HIGH (ref 70–99)
Potassium: 3.7 mmol/L (ref 3.5–5.1)
Sodium: 140 mmol/L (ref 135–145)

## 2022-12-21 LAB — POCT ACTIVATED CLOTTING TIME
Activated Clotting Time: 309 seconds
Activated Clotting Time: 244 seconds

## 2022-12-21 LAB — CBC
HCT: 46.8 % — ABNORMAL HIGH (ref 36.0–46.0)
Hemoglobin: 15.7 g/dL — ABNORMAL HIGH (ref 12.0–15.0)
MCH: 30.4 pg (ref 26.0–34.0)
MCHC: 33.5 g/dL (ref 30.0–36.0)
MCV: 90.5 fL (ref 80.0–100.0)
Platelets: 303 10*3/uL (ref 150–400)
RBC: 5.17 MIL/uL — ABNORMAL HIGH (ref 3.87–5.11)
RDW: 12.7 % (ref 11.5–15.5)
WBC: 7.6 10*3/uL (ref 4.0–10.5)
nRBC: 0 % (ref 0.0–0.2)

## 2022-12-21 LAB — TROPONIN I (HIGH SENSITIVITY): Troponin I (High Sensitivity): 10 ng/L (ref <18)

## 2022-12-21 SURGERY — LEFT HEART CATH AND CORONARY ANGIOGRAPHY
Anesthesia: LOCAL

## 2022-12-21 MED ORDER — NITROGLYCERIN 1 MG/10 ML FOR IR/CATH LAB
INTRA_ARTERIAL | Status: AC
  Filled 2022-12-21: qty 10

## 2022-12-21 MED ORDER — HYDRALAZINE HCL 20 MG/ML IJ SOLN: 10.0000 mg | INTRAMUSCULAR | Status: AC | PRN

## 2022-12-21 MED ORDER — LABETALOL HCL 5 MG/ML IV SOLN
INTRAVENOUS | Status: AC
  Filled 2022-12-21: qty 4

## 2022-12-21 MED ORDER — IRBESARTAN 150 MG PO TABS
150.0000 mg | ORAL_TABLET | Freq: Every day | ORAL | Status: DC
  Administered 2022-12-22: 150 mg via ORAL
  Filled 2022-12-21: qty 1

## 2022-12-21 MED ORDER — HEPARIN SODIUM (PORCINE) 1000 UNIT/ML IJ SOLN
INTRAMUSCULAR | Status: DC | PRN
  Administered 2022-12-21 (×2): 4500 [IU] via INTRAVENOUS

## 2022-12-21 MED ORDER — VERAPAMIL HCL 2.5 MG/ML IV SOLN
INTRAVENOUS | Status: DC | PRN
  Administered 2022-12-21: 10 mL via INTRA_ARTERIAL

## 2022-12-21 MED ORDER — FENTANYL CITRATE (PF) 100 MCG/2ML IJ SOLN
INTRAMUSCULAR | Status: AC
  Filled 2022-12-21: qty 2

## 2022-12-21 MED ORDER — SODIUM CHLORIDE 0.9 % IV SOLN: 250.0000 mL | INTRAVENOUS | Status: DC | PRN

## 2022-12-21 MED ORDER — ASPIRIN 81 MG PO CHEW
81.0000 mg | CHEWABLE_TABLET | ORAL | Status: AC
  Administered 2022-12-21: 81 mg via ORAL
  Filled 2022-12-21: qty 1

## 2022-12-21 MED ORDER — NITROGLYCERIN 0.4 MG SL SUBL: 0.4000 mg | SUBLINGUAL_TABLET | SUBLINGUAL | Status: DC | PRN

## 2022-12-21 MED ORDER — TICAGRELOR 90 MG PO TABS
90.0000 mg | ORAL_TABLET | Freq: Two times a day (BID) | ORAL | Status: DC
  Administered 2022-12-22: 90 mg via ORAL
  Filled 2022-12-21: qty 1

## 2022-12-21 MED ORDER — HEPARIN (PORCINE) IN NACL 1000-0.9 UT/500ML-% IV SOLN
INTRAVENOUS | Status: DC | PRN
  Administered 2022-12-21 (×2): 500 mL

## 2022-12-21 MED ORDER — IOHEXOL 350 MG/ML SOLN
INTRAVENOUS | Status: DC | PRN
  Administered 2022-12-21: 100 mL via INTRA_ARTERIAL

## 2022-12-21 MED ORDER — ONDANSETRON HCL 4 MG/2ML IJ SOLN
4.0000 mg | Freq: Four times a day (QID) | INTRAMUSCULAR | Status: DC | PRN
  Administered 2022-12-21: 4 mg via INTRAVENOUS
  Filled 2022-12-21: qty 2

## 2022-12-21 MED ORDER — NITROGLYCERIN 0.4 MG SL SUBL: 0.4000 mg | SUBLINGUAL_TABLET | SUBLINGUAL | 0 refills | Status: DC | PRN

## 2022-12-21 MED ORDER — MIDAZOLAM HCL 2 MG/2ML IJ SOLN
INTRAMUSCULAR | Status: AC
  Filled 2022-12-21: qty 2

## 2022-12-21 MED ORDER — SODIUM CHLORIDE 0.9 % WEIGHT BASED INFUSION
3.0000 mL/kg/h | INTRAVENOUS | Status: AC
  Administered 2022-12-21: 3 mL/kg/h via INTRAVENOUS

## 2022-12-21 MED ORDER — VERAPAMIL HCL 2.5 MG/ML IV SOLN
INTRAVENOUS | Status: AC
  Filled 2022-12-21: qty 2

## 2022-12-21 MED ORDER — ASPIRIN 81 MG PO TBEC
81.0000 mg | DELAYED_RELEASE_TABLET | Freq: Every day | ORAL | Status: DC
  Administered 2022-12-22: 81 mg via ORAL
  Filled 2022-12-21: qty 1

## 2022-12-21 MED ORDER — AMLODIPINE BESYLATE 5 MG PO TABS
5.0000 mg | ORAL_TABLET | Freq: Every day | ORAL | Status: DC
  Administered 2022-12-21 – 2022-12-22 (×2): 5 mg via ORAL
  Filled 2022-12-21 (×2): qty 1

## 2022-12-21 MED ORDER — NITROGLYCERIN 1 MG/10 ML FOR IR/CATH LAB
INTRA_ARTERIAL | Status: DC | PRN
  Administered 2022-12-21: 200 ug via INTRACORONARY

## 2022-12-21 MED ORDER — AMLODIPINE-ATORVASTATIN 5-10 MG PO TABS: 1.0000 | ORAL_TABLET | Freq: Every day | ORAL | Status: DC

## 2022-12-21 MED ORDER — LIDOCAINE HCL (PF) 1 % IJ SOLN
INTRAMUSCULAR | Status: AC
  Filled 2022-12-21: qty 30

## 2022-12-21 MED ORDER — FENTANYL CITRATE (PF) 100 MCG/2ML IJ SOLN
INTRAMUSCULAR | Status: DC | PRN
  Administered 2022-12-21 (×3): 25 ug via INTRAVENOUS

## 2022-12-21 MED ORDER — SODIUM CHLORIDE 0.9% FLUSH
3.0000 mL | Freq: Two times a day (BID) | INTRAVENOUS | Status: DC
  Administered 2022-12-21 – 2022-12-22 (×2): 3 mL via INTRAVENOUS

## 2022-12-21 MED ORDER — LIDOCAINE HCL (PF) 1 % IJ SOLN
INTRAMUSCULAR | Status: DC | PRN
  Administered 2022-12-21: 2 mL

## 2022-12-21 MED ORDER — SODIUM CHLORIDE 0.9% FLUSH: 3.0000 mL | INTRAVENOUS | Status: DC | PRN

## 2022-12-21 MED ORDER — LABETALOL HCL 5 MG/ML IV SOLN
INTRAVENOUS | Status: DC | PRN
  Administered 2022-12-21: 10 mg via INTRAVENOUS

## 2022-12-21 MED ORDER — HEPARIN SODIUM (PORCINE) 1000 UNIT/ML IJ SOLN
INTRAMUSCULAR | Status: AC
  Filled 2022-12-21: qty 10

## 2022-12-21 MED ORDER — SODIUM CHLORIDE 0.9 % WEIGHT BASED INFUSION: 1.0000 mL/kg/h | INTRAVENOUS | Status: DC

## 2022-12-21 MED ORDER — TICAGRELOR 90 MG PO TABS
90.0000 mg | ORAL_TABLET | Freq: Two times a day (BID) | ORAL | 1 refills | Status: DC
  Filled 2022-12-21 (×2): qty 60, 30d supply, fill #0

## 2022-12-21 MED ORDER — ASPIRIN 81 MG PO TBEC: 81.0000 mg | DELAYED_RELEASE_TABLET | Freq: Every day | ORAL | 12 refills | Status: DC

## 2022-12-21 MED ORDER — MIDAZOLAM HCL 2 MG/2ML IJ SOLN
INTRAMUSCULAR | Status: DC | PRN
  Administered 2022-12-21 (×3): 1 mg via INTRAVENOUS

## 2022-12-21 MED ORDER — PANTOPRAZOLE SODIUM 40 MG PO TBEC
40.0000 mg | DELAYED_RELEASE_TABLET | Freq: Every day | ORAL | Status: DC
  Administered 2022-12-22: 40 mg via ORAL
  Filled 2022-12-21: qty 1

## 2022-12-21 MED ORDER — SODIUM CHLORIDE 0.9 % IV SOLN: INTRAVENOUS | Status: AC

## 2022-12-21 MED ORDER — ACETAMINOPHEN 325 MG PO TABS
650.0000 mg | ORAL_TABLET | ORAL | Status: DC | PRN
  Administered 2022-12-21: 650 mg via ORAL
  Filled 2022-12-21 (×2): qty 2

## 2022-12-21 MED ORDER — ROSUVASTATIN CALCIUM 20 MG PO TABS
40.0000 mg | ORAL_TABLET | Freq: Every day | ORAL | Status: DC
  Administered 2022-12-22: 40 mg via ORAL
  Filled 2022-12-21: qty 2

## 2022-12-21 MED ORDER — ACETAMINOPHEN-CODEINE #3 300-30 MG PO TABS: 1.0000 | ORAL_TABLET | Freq: Three times a day (TID) | ORAL | Status: DC | PRN

## 2022-12-21 MED ORDER — LABETALOL HCL 5 MG/ML IV SOLN
10.0000 mg | INTRAVENOUS | Status: AC | PRN
  Administered 2022-12-21: 10 mg via INTRAVENOUS
  Filled 2022-12-21: qty 4

## 2022-12-21 MED ORDER — TICAGRELOR 90 MG PO TABS
ORAL_TABLET | ORAL | Status: DC | PRN
  Administered 2022-12-21: 180 mg via ORAL

## 2022-12-21 SURGICAL SUPPLY — 22 items
BALLN SAPPHIRE 2.5X12 (BALLOONS) ×1
BALLN ~~LOC~~ EMERGE MR 3.0X12 (BALLOONS) ×1
BALLOON SAPPHIRE 2.5X12 (BALLOONS) IMPLANT
BALLOON ~~LOC~~ EMERGE MR 3.0X12 (BALLOONS) IMPLANT
CATH LAUNCHER 6FR EBU3.5 (CATHETERS) IMPLANT
CATH OPTITORQUE TIG 4.0 5F (CATHETERS) IMPLANT
DEVICE RAD COMP TR BAND LRG (VASCULAR PRODUCTS) IMPLANT
GLIDESHEATH SLEND A-KIT 6F 22G (SHEATH) IMPLANT
GLIDESHEATH SLEND SS 6F .021 (SHEATH) IMPLANT
GUIDEWIRE INQWIRE 1.5J.035X260 (WIRE) IMPLANT
INQWIRE 1.5J .035X260CM (WIRE) ×1
KIT ENCORE 26 ADVANTAGE (KITS) IMPLANT
KIT HEART LEFT (KITS) ×1 IMPLANT
KIT HEMO VALVE WATCHDOG (MISCELLANEOUS) IMPLANT
PACK CARDIAC CATHETERIZATION (CUSTOM PROCEDURE TRAY) ×1 IMPLANT
SHEATH PROBE COVER 6X72 (BAG) IMPLANT
STENT SYNERGY XD 3.0X16 (Permanent Stent) IMPLANT
SYNERGY XD 3.0X16 (Permanent Stent) ×1 IMPLANT
TRANSDUCER W/STOPCOCK (MISCELLANEOUS) ×1 IMPLANT
TUBING CIL FLEX 10 FLL-RA (TUBING) ×1 IMPLANT
WIRE ASAHI PROWATER 180CM (WIRE) IMPLANT
WIRE HI TORQ BMW 190CM (WIRE) IMPLANT

## 2022-12-21 NOTE — Progress Notes (Addendum)
ID:  Amy Kerr, DOB [birth year], MRN [id number]  PCP:  [name], MD  Cardiologist:  Rex Kras, DO, [facility] (established care 12/21/2022)  REASON FOR CONSULT: Chest pain  REQUESTING PHYSICIAN:  [name], MD [place] [employer] [address],  [place] [address]  Chief Complaint  Patient presents with   Chest Pain   New Patient (Initial Visit)    HPI  Amy Kerr is a 72 y.o. Caucasian female who presents to the clinic for evaluation of chest pain at the request of [name], MD. Her past medical history and cardiovascular risk factors include: Hypertension, hyperlipidemia, GERD, vitamin D deficiency, atherosclerosis of the aorta.  Patient is accompanied by her husband at today's office visit for evaluation of chest pain.  She started noticing epigastric/substernal discomfort starting last week.  She saw her primary care provider and was started on pantoprazole without any significant improvement.  The discomfort is happening daily and now occurring more frequently.  Feels like a tightness like sensation, at its worst the intensity is 9 out of 10, no active chest pain, worse with effort related activities such as taking laundry out and folding it, better with resting.  At times it radiates to bilateral shoulders, left neck and back.  No family history of premature coronary artery disease, sudden cardiac death, or cardiomyopathy.  No prior cardiac workup to the best of her knowledge.  No prior EKGs available for review.  FUNCTIONAL STATUS: No structured exercise program or daily routine.   ALLERGIES: Allergies  Allergen Reactions   Codeine Nausea Only and Other (See Comments)   Other     Other Reaction(s): severe nausea   Zinc Nausea Only    MEDICATION LIST PRIOR TO VISIT: Current Meds  Medication Sig   acetaminophen-codeine (TYLENOL #3) 300-30 MG tablet Take 1-2 tablets by mouth every 8 (eight) hours as needed.   amLODipine-atorvastatin (CADUET) 5-10  MG per tablet Take 1 tablet by mouth daily.   aspirin EC 81 MG tablet Take 1 tablet (81 mg total) by mouth daily. Swallow whole.   Cholecalciferol (VITAMIN D-3) 25 MCG (1000 UT) CAPS Take 2,000 Units by mouth daily at 2 PM.   nitroGLYCERIN (NITROSTAT) 0.4 MG SL tablet Place 1 tablet (0.4 mg total) under the tongue every 5 (five) minutes as needed for chest pain. If you require more than two tablets five minutes apart go to the nearest ER via EMS.   olmesartan (BENICAR) 20 MG tablet Take 20 mg by mouth daily.   pantoprazole (PROTONIX) 40 MG tablet Take 40 mg by mouth daily.     PAST MEDICAL HISTORY: Past Medical History:  Diagnosis Date   Hyperlipidemia    Hypertension     PAST SURGICAL HISTORY: Past Surgical History:  Procedure Laterality Date   COLONOSCOPY N/A 10/23/2013   Procedure: COLONOSCOPY;  Surgeon: Garlan Fair, MD;  Location: [facility];  Service: Endoscopy;  Laterality: N/A;   GALLBLADDER SURGERY     LAPAROSCOPIC APPENDECTOMY N/A 03/22/2013   Procedure: APPENDECTOMY LAPAROSCOPIC;  Surgeon: Haywood Lasso, MD;  Location: [facility] OR;  Service: General;  Laterality: N/A;    FAMILY HISTORY: The patient family history includes Cancer - Cervical in her mother; Esophageal cancer in her sister; Heart disease in her brother and father; Stomach cancer in her brother.  SOCIAL HISTORY:  The patient  reports that she has never smoked. She has never used smokeless tobacco. She reports that she does not drink alcohol and does not use  drugs.  REVIEW OF SYSTEMS: Review of Systems  Cardiovascular:  Positive for chest pain. Negative for claudication (see HPI), dyspnea on exertion, irregular heartbeat, leg swelling, near-syncope, orthopnea, palpitations, paroxysmal nocturnal dyspnea and syncope.  Respiratory:  Negative for shortness of breath.   Hematologic/Lymphatic: Negative for bleeding problem.  Musculoskeletal:  Negative for muscle cramps and myalgias.  Neurological:  Negative  for dizziness and light-headedness.    PHYSICAL EXAM:    12/21/2022   10:51 AM 06/04/2020    2:37 PM 11/06/2015   12:38 AM  Vitals with BMI  Height 5\' 4"     Weight 190 lbs    BMI A999333    Systolic A999333 [id number]  Diastolic 83 98 65  Pulse 85 77 70    Physical Exam  Constitutional: No distress.  Age appropriate, hemodynamically stable.   Neck: No JVD present.  Cardiovascular: Normal rate, regular rhythm, S1 normal, S2 normal, intact distal pulses and normal pulses. Exam reveals no gallop, no S3 and no S4.  No murmur heard. Pulmonary/Chest: Effort normal and breath sounds normal. No stridor. She has no wheezes. She has no rales.  Abdominal: Soft. Bowel sounds are normal. She exhibits no distension. There is no abdominal tenderness.  Musculoskeletal:        General: No edema.     Cervical back: Neck supple.  Neurological: She is alert and oriented to person, place, and time. She has intact cranial nerves (2-12).  Skin: Skin is warm and moist.   CARDIAC DATABASE: EKG: 12/21/2022: Sinus rhythm, 79 bpm, TWI in high lateral and lateral leads suggestive of anterolateral ischemia.  No prior EKGs available for review  Echocardiogram: No results found for this or any previous visit from the past 1095 days.    Stress Testing: No results found for this or any previous visit from the past 1095 days.   Heart Catheterization: None  LABORATORY DATA: External Labs: Collected: 12/06/2022 provided by referring physician. A1c 5.7 Hemoglobin 14.2, hematocrit 42.8% BUN 14, creatinine 1.02 Sodium 141, potassium 4.5, chloride 107, bicarb 28. AST 24, ALT 30, alkaline phosphatase 66  TSH 2.44 Total cholesterol 167, triglycerides 219, HDL 46, LDL calculated 85, non-HDL 122 High sensitive troponin 13  IMPRESSION:    ICD-10-CM   1. Unstable angina (HCC)  I20.0 EKG 12-Lead    aspirin EC 81 MG tablet    nitroGLYCERIN (NITROSTAT) 0.4 MG SL tablet    2. Benign hypertension  I10     3. Mixed  hyperlipidemia  E78.2     4. Class 1 obesity due to excess calories without serious comorbidity with body mass index (BMI) of 32.0 to 32.9 in adult  E66.09    Z68.32        RECOMMENDATIONS: Amy Kerr is a 72 y.o. Caucasian female whose past medical history and cardiac risk factors include: Hypertension, hyperlipidemia, GERD, vitamin D deficiency, atherosclerosis of the aorta.  Unstable angina (HCC) Discomfort started last week. Progressively getting worse with regards to frequent episodes. EKG shows sinus rhythm with TWI in the high lateral & lateral leads suggestive of anterolateral ischemia. Anginal symptoms of worsening with household activities. No active chest pain during the visit. No significant change after starting PPI per PCP. Symptoms are concerning for unstable angina.  Spoke to interventional cardiologist Dr. Einar Gip who was kind to schedule heart catheterization today to evaluate for obstructive CAD.  If she is noted to have obstructive CAD warranting admission will request the bed after the heart catheterization.  Risks, benefits,  alternatives, and limitations discussed with both the patient and her husband and they are willing to proceed with invasive left heart catheterization with possible intervention. Outside labs from March 2024 independently reviewed and noted above. Coordination of care with interventional cardiologist, Cath Lab, and admitting. Start aspirin 81 mg p.o. daily. Continue statin therapy as prescribed- may need up titration. Sublingual nitroglycerin tablets to use on appearing basis.  Benign hypertension Office blood pressures are not well-controlled. Home blood pressures range between 120-135 mmHg per patient. Continue current antihypertensive medications.  Mixed hyperlipidemia Currently on atorvastatin.   She denies myalgia or other side effects. Most recent lipids dated March 2024, independently reviewed as noted above. Currently managed by  primary care provider.  Class 1 obesity due to excess calories without serious comorbidity with body mass index (BMI) of 32.0 to 32.9 in adult Body mass index is 32.61 kg/m. I reviewed with her importance of diet, regular physical activity/exercise, weight loss.   Patient is educated on the importance of increasing physical activity gradually as tolerated with a goal of moderate intensity exercise for 30 minutes a day 5 days a week.  Data Reviewed: I have independently reviewed external notes provided by the referring provider as part of this office visit.   I have independently reviewed results of labs, EKG as part of medical decision making. I have ordered the following tests:  Orders Placed This Encounter  Procedures   EKG 12-Lead   I have made medications changes at today's encounter as noted above. History of present illness was obtained by both patient and husband at today's office visit.    FINAL MEDICATION LIST END OF ENCOUNTER: Meds ordered this encounter  Medications   aspirin EC 81 MG tablet    Sig: Take 1 tablet (81 mg total) by mouth daily. Swallow whole.    Dispense:  30 tablet    Refill:  12   nitroGLYCERIN (NITROSTAT) 0.4 MG SL tablet    Sig: Place 1 tablet (0.4 mg total) under the tongue every 5 (five) minutes as needed for chest pain. If you require more than two tablets five minutes apart go to the nearest ER via EMS.    Dispense:  30 tablet    Refill:  0    Medications Discontinued During This Encounter  Medication Reason   aspirin 81 MG tablet Patient Preference   Calcium Carbonate-Vitamin D (CALCIUM + D PO) Patient Preference   cyclobenzaprine (FLEXERIL) 10 MG tablet Patient Preference   diclofenac sodium (VOLTAREN) 1 % GEL Patient Preference   gabapentin (NEURONTIN) 300 MG capsule Patient Preference   methylPREDNISolone (MEDROL) 4 MG tablet Patient Preference   traMADol (ULTRAM) 50 MG tablet Patient Preference   naproxen (NAPROSYN) 500 MG tablet Patient  Preference     Current Outpatient Medications:    acetaminophen-codeine (TYLENOL #3) 300-30 MG tablet, Take 1-2 tablets by mouth every 8 (eight) hours as needed., Disp: 30 tablet, Rfl: 0   amLODipine-atorvastatin (CADUET) 5-10 MG per tablet, Take 1 tablet by mouth daily., Disp: , Rfl:    aspirin EC 81 MG tablet, Take 1 tablet (81 mg total) by mouth daily. Swallow whole., Disp: 30 tablet, Rfl: 12   Cholecalciferol (VITAMIN D-3) 25 MCG (1000 UT) CAPS, Take 2,000 Units by mouth daily at 2 PM., Disp: , Rfl:    nitroGLYCERIN (NITROSTAT) 0.4 MG SL tablet, Place 1 tablet (0.4 mg total) under the tongue every 5 (five) minutes as needed for chest pain. If you require more than two tablets  five minutes apart go to the nearest ER via EMS., Disp: 30 tablet, Rfl: 0   olmesartan (BENICAR) 20 MG tablet, Take 20 mg by mouth daily., Disp: , Rfl:    pantoprazole (PROTONIX) 40 MG tablet, Take 40 mg by mouth daily., Disp: , Rfl:   Orders Placed This Encounter  Procedures   EKG 12-Lead    There are no Patient Instructions on file for this visit.   --Continue cardiac medications as reconciled in final medication list. --Return in about 1 week (around 12/28/2022) for s/p Left heart catheterization. or sooner if needed. --Continue follow-up with your primary care physician regarding the management of your other chronic comorbid conditions.  Patient's questions and concerns were addressed to her satisfaction. She voices understanding of the instructions provided during this encounter.   This note was created using a voice recognition software as a result there may be grammatical errors inadvertently enclosed that do not reflect the nature of this encounter. Every attempt is made to correct such errors.  Mechele Claude [facility]  Pager:  [phone] Office: [phone]  ADDENDUM: Patient was admitted for unstable angina on 12/21/2022 at the hospital after undergoing left heart catheterization with PCI.  Office  charges have been changed to " no charge" from prior billing to avoid double charges for the same day.  Rex Kras, [place], [facility]  Pager:  [phone] Office: [phone]

## 2022-12-21 NOTE — Addendum Note (Signed)
Addended by: Oran Rein on: 12/21/2022 06:39 PM   Modules accepted: Level of Service

## 2022-12-21 NOTE — H&P (Signed)
HISTORY AND PHYSICAL  Patient ID: Amy Kerr MRN: [id number] DOB/AGE: 1951-03-20 72 y.o.  Admit date: 12/21/2022 Attending physician: Nigel Mormon, MD Primary Physician:  [name], MD  Chief complaint: chest pain  HPI:  Amy Kerr is a 72 y.o. female who presents with a chief complaint of "chest pain." Her past medical history and cardiovascular risk factors include: Hypertension, hyperlipidemia, GERD, vitamin D deficiency, atherosclerosis of the aorta.  Presented to the clinic today for evaluation of chest pain.  Discomfort is located epigastric/substernal, ongoing for the last 1 week, progressive, worse with activities (house chores),better with resting, at times radiates to bilateral shoulders/neck/back.   Due to symptoms concerning for unstable angina patient was scheduled for an outpatient heart catheterization was found to have obstructive CAD status post PCI to OM1.   Patient is being admitted for overnight observation.   ALLERGIES: Allergies  Allergen Reactions   Codeine Nausea Only and Other (See Comments)   Morphine Nausea Only   Other     Other Reaction(s): severe nausea   Zinc Nausea Only    PAST MEDICAL HISTORY: Past Medical History:  Diagnosis Date   Hyperlipidemia    Hypertension     PAST SURGICAL HISTORY: Past Surgical History:  Procedure Laterality Date   COLONOSCOPY N/A 10/23/2013   Procedure: COLONOSCOPY;  Surgeon: Garlan Fair, MD;  Location: [facility];  Service: Endoscopy;  Laterality: N/A;   GALLBLADDER SURGERY     LAPAROSCOPIC APPENDECTOMY N/A 03/22/2013   Procedure: APPENDECTOMY LAPAROSCOPIC;  Surgeon: Haywood Lasso, MD;  Location: [facility] OR;  Service: General;  Laterality: N/A;    FAMILY HISTORY: The patient family history includes Cancer - Cervical in her mother; Esophageal cancer in her sister; Heart disease in her brother and father; Stomach cancer in her brother.   SOCIAL HISTORY:  The patient  reports that  she has never smoked. She has never used smokeless tobacco. She reports that she does not drink alcohol and does not use drugs.  MEDICATIONS: Current Outpatient Medications  Medication Instructions   acetaminophen-codeine (TYLENOL #3) 300-30 MG tablet 1-2 tablets, Oral, Every 8 hours PRN   amLODipine-atorvastatin (CADUET) 5-10 MG per tablet 1 tablet, Daily   aspirin EC 81 mg, Oral, Daily, Swallow whole.   Brilinta 90 mg, Oral, 2 times daily   nitroGLYCERIN (NITROSTAT) 0.4 mg, Sublingual, Every 5 min PRN, If you require more than two tablets five minutes apart go to the nearest ER via EMS.   olmesartan (BENICAR) 20 mg, Oral, Daily   pantoprazole (PROTONIX) 40 mg, Oral, Daily   Vitamin D-3 2,000 Units, Oral, Daily     sodium chloride 100 mL/hr at 12/21/22 1705   sodium chloride      REVIEW OF SYSTEMS: Review of Systems  Cardiovascular:  Positive for chest pain. Negative for claudication, dyspnea on exertion, irregular heartbeat, leg swelling, near-syncope, orthopnea, palpitations, paroxysmal nocturnal dyspnea and syncope.  Respiratory:  Negative for shortness of breath.   Hematologic/Lymphatic: Negative for bleeding problem.  Musculoskeletal:  Negative for muscle cramps and myalgias.  Neurological:  Negative for dizziness and light-headedness.    PHYSICAL EXAM:    12/21/2022    4:54 PM 12/21/2022   12:49 PM 12/21/2022   10:51 AM  Vitals with BMI  Height  5\' 4"  5\' 4"   Weight  190 lbs 190 lbs  BMI  A999333 A999333  Systolic [id number] A999333  Diastolic 78 91 83  Pulse 80 84 85     Intake/Output Summary (Last  24 hours) at 12/21/2022 1825 Last data filed at 12/21/2022 1800 Gross per 24 hour  Intake 435.99 ml  Output --  Net 435.99 ml    Net IO Since Admission: 435.99 mL [12/21/22 1825]  Physical Exam  Constitutional: No distress.  Age appropriate, hemodynamically stable.   Neck: No JVD present.  Cardiovascular: Normal rate, regular rhythm, S1 normal, S2 normal, intact distal pulses  and normal pulses. Exam reveals no gallop, no S3 and no S4.  No murmur heard. Pulmonary/Chest: Effort normal and breath sounds normal. No stridor. She has no wheezes. She has no rales.  Abdominal: Soft. Bowel sounds are normal. She exhibits no distension. There is no abdominal tenderness.  Musculoskeletal:        General: No edema.     Cervical back: Neck supple.  Neurological: She is alert and oriented to person, place, and time. She has intact cranial nerves (2-12).  Skin: Skin is warm and moist.    RADIOLOGY: CARDIAC CATHETERIZATION  Result Date: 12/21/2022 Images from the original result were not included. LM: Normal LAD: Mid 30% disease         High diag 1 with mild luminal irregularities Lcx: Prox OM1 with focal 95% stenosis RCA: Mid tandem 30% and 40% stenoses LVEDP normal LVEF normal Presentation with unstable angina Successful percutaneous coronary intervention prox OM1        PTCA and stent placement 3.0 X 16 mm Synergy drug-eluting stent        Post dilatation with 3.0X12 mm Medon balloon up to 20 atm Medical management for rest of the nonobstructive disease  Nigel Mormon, MD Pager: [phone] Office: [phone]    LABORATORY DATA: Lab Results  Component Value Date   WBC 7.6 12/21/2022   HGB 15.7 (H) 12/21/2022   HCT 46.8 (H) 12/21/2022   MCV 90.5 12/21/2022   PLT 303 12/21/2022    Recent Labs  Lab 12/21/22 1255  NA 140  K 3.7  CL 108  CO2 23  BUN 12  CREATININE 0.99  CALCIUM 8.6*  GLUCOSE 101*    Lipid Panel  No results found for: "CHOL", "HDL", "LDLCALC", "LDLDIRECT", "TRIG", "CHOLHDL"  BNP (last 3 results) No results for input(s): "BNP" in the last 8760 hours.  HEMOGLOBIN A1C No results found for: "HGBA1C", "MPG"  Cardiac Panel (last 3 results) No results for input(s): "CKTOTAL", "CKMB", "RELINDX" in the last 8760 hours.  Invalid input(s): "TROPONINHS"  No results found for: "CKTOTAL", "CKMB", "CKMBINDEX"   TSH No results for input(s):  "TSH" in the last 8760 hours.    CARDIAC DATABASE: EKG: 12/21/2022: Sinus rhythm, 79 bpm, TWI in high lateral and lateral leads suggestive of anterolateral ischemia.  No prior EKGs available for review   Echocardiogram: No results found for this or any previous visit from the past 1095 days.     Stress Testing: No results found for this or any previous visit from the past 1095 days.     Heart Catheterization: 12/21/2022 LM: Normal LAD: Mid 30% disease         High diag 1 with mild luminal irregularities Lcx: Prox OM1 with focal 95% stenosis RCA: Mid tandem 30% and 40% stenoses   LVEDP normal LVEF normal   Presentation with unstable angina Successful percutaneous coronary intervention prox OM1        PTCA and stent placement 3.0 X 16 mm Synergy drug-eluting stent        Post dilatation with 3.0X12 mm Micco balloon up to 20 atm  Medical management for rest of the nonobstructive disease              LABORATORY DATA: External Labs: Collected: 12/06/2022 provided by referring physician. A1c 5.7 Hemoglobin 14.2, hematocrit 42.8% BUN 14, creatinine 1.02 Sodium 141, potassium 4.5, chloride 107, bicarb 28. AST 24, ALT 30, alkaline phosphatase 66  TSH 2.44 Total cholesterol 167, triglycerides 219, HDL 46, LDL calculated 85, non-HDL 122 High sensitive troponin 13  IMPRESSION & RECOMMENDATIONS: CORDA LEIBER is a 72 y.o. Caucasian female whose past medical history and cardiovascular risk factors include: Hypertension, hyperlipidemia, GERD, vitamin D deficiency, atherosclerosis of the aorta.  Impression: Unstable angina. Coronary artery disease with angina pectoris status post PCI to OM1 Hypertension. Hyperlipidemia. Obesity due to excess calories.  Recommendations: Unstable angina:  Coronary disease with angina pectoris status post PCI to OM1: Status post invasive angiography with PCI to OM1 Recommend dual antiplatelet therapy for 1 year. Will transition to high  intensity statin Reemphasized importance of improving her modifiable cardiovascular risk factors. Telemetry to evaluate for reperfusion arrhythmias Coordinated care with regards to interventional cardiology, Cath Lab, and admitting. Will arrange 2D echo as outpatient. Overnight observation.  Benign essential hypertension: Resume home meds.  Hyperlipidemia: Transition to high intensity statin, target LDL <55 mg/dL.  Consultants: None   Code Status: Full code   Family Communication: Spoke to the husband during the office visit.   Disposition Plan: Home, likely tomorrow   Patient's questions and concerns were addressed to her satisfaction. She voices understanding of the instructions provided during this encounter.   This note was created using a voice recognition software as a result there may be grammatical errors inadvertently enclosed that do not reflect the nature of this encounter. Every attempt is made to correct such errors.  Mechele Claude [facility]  Pager: [phone] Office: [phone] 12/21/2022, 6:25 PM

## 2022-12-21 NOTE — H&P (View-Only) (Signed)
ID:  Amy Kerr, DOB 1951-07-28, MRN [id number]  PCP:  [name], MD  Cardiologist:  Rex Kras, DO, [facility] (established care 12/21/2022)  REASON FOR CONSULT: Chest pain  REQUESTING PHYSICIAN:  [name], MD [place] [employer] [address],  [place] [address]  Chief Complaint  Patient presents with   Chest Pain   New Patient (Initial Visit)    HPI  Amy Kerr is a 72 y.o. Caucasian female who presents to the clinic for evaluation of chest pain at the request of [name], MD. Her past medical history and cardiovascular risk factors include: Hypertension, hyperlipidemia, GERD, vitamin D deficiency, atherosclerosis of the aorta.  Patient is accompanied by her husband at today's office visit for evaluation of chest pain.  She started noticing epigastric/substernal discomfort starting last week.  She saw her primary care provider and was started on pantoprazole without any significant improvement.  The discomfort is happening daily and now occurring more frequently.  Feels like a tightness like sensation, at its worst the intensity is 9 out of 10, no active chest pain, worse with effort related activities such as taking laundry out and folding it, better with resting.  At times it radiates to bilateral shoulders, left neck and back.  No family history of premature coronary artery disease, sudden cardiac death, or cardiomyopathy.  No prior cardiac workup to the best of her knowledge.  No prior EKGs available for review.  FUNCTIONAL STATUS: No structured exercise program or daily routine.   ALLERGIES: Allergies  Allergen Reactions   Codeine Nausea Only and Other (See Comments)   Other     Other Reaction(s): severe nausea   Zinc Nausea Only    MEDICATION LIST PRIOR TO VISIT: Current Meds  Medication Sig   acetaminophen-codeine (TYLENOL #3) 300-30 MG tablet Take 1-2 tablets by mouth every 8 (eight) hours as needed.   amLODipine-atorvastatin (CADUET) 5-10  MG per tablet Take 1 tablet by mouth daily.   aspirin EC 81 MG tablet Take 1 tablet (81 mg total) by mouth daily. Swallow whole.   Cholecalciferol (VITAMIN D-3) 25 MCG (1000 UT) CAPS Take 2,000 Units by mouth daily at 2 PM.   nitroGLYCERIN (NITROSTAT) 0.4 MG SL tablet Place 1 tablet (0.4 mg total) under the tongue every 5 (five) minutes as needed for chest pain. If you require more than two tablets five minutes apart go to the nearest ER via EMS.   olmesartan (BENICAR) 20 MG tablet Take 20 mg by mouth daily.   pantoprazole (PROTONIX) 40 MG tablet Take 40 mg by mouth daily.     PAST MEDICAL HISTORY: Past Medical History:  Diagnosis Date   Hyperlipidemia    Hypertension     PAST SURGICAL HISTORY: Past Surgical History:  Procedure Laterality Date   COLONOSCOPY N/A 10/23/2013   Procedure: COLONOSCOPY;  Surgeon: Garlan Fair, MD;  Location: [facility];  Service: Endoscopy;  Laterality: N/A;   GALLBLADDER SURGERY     LAPAROSCOPIC APPENDECTOMY N/A 03/22/2013   Procedure: APPENDECTOMY LAPAROSCOPIC;  Surgeon: Haywood Lasso, MD;  Location: [facility] OR;  Service: General;  Laterality: N/A;    FAMILY HISTORY: The patient family history includes Cancer - Cervical in her mother; Esophageal cancer in her sister; Heart disease in her brother and father; Stomach cancer in her brother.  SOCIAL HISTORY:  The patient  reports that she has never smoked. She has never used smokeless tobacco. She reports that she does not drink alcohol and does not use  drugs.  REVIEW OF SYSTEMS: Review of Systems  Cardiovascular:  Positive for chest pain. Negative for claudication (see HPI), dyspnea on exertion, irregular heartbeat, leg swelling, near-syncope, orthopnea, palpitations, paroxysmal nocturnal dyspnea and syncope.  Respiratory:  Negative for shortness of breath.   Hematologic/Lymphatic: Negative for bleeding problem.  Musculoskeletal:  Negative for muscle cramps and myalgias.  Neurological:  Negative  for dizziness and light-headedness.    PHYSICAL EXAM:    12/21/2022   10:51 AM 06/04/2020    2:37 PM 11/06/2015   12:38 AM  Vitals with BMI  Height 5\' 4"     Weight 190 lbs    BMI A999333    Systolic A999333 [id number]  Diastolic 83 98 65  Pulse 85 77 70    Physical Exam  Constitutional: No distress.  Age appropriate, hemodynamically stable.   Neck: No JVD present.  Cardiovascular: Normal rate, regular rhythm, S1 normal, S2 normal, intact distal pulses and normal pulses. Exam reveals no gallop, no S3 and no S4.  No murmur heard. Pulmonary/Chest: Effort normal and breath sounds normal. No stridor. She has no wheezes. She has no rales.  Abdominal: Soft. Bowel sounds are normal. She exhibits no distension. There is no abdominal tenderness.  Musculoskeletal:        General: No edema.     Cervical back: Neck supple.  Neurological: She is alert and oriented to person, place, and time. She has intact cranial nerves (2-12).  Skin: Skin is warm and moist.   CARDIAC DATABASE: EKG: 12/21/2022: Sinus rhythm, 79 bpm, TWI in high lateral and lateral leads suggestive of anterolateral ischemia.  No prior EKGs available for review  Echocardiogram: No results found for this or any previous visit from the past 1095 days.    Stress Testing: No results found for this or any previous visit from the past 1095 days.   Heart Catheterization: None  LABORATORY DATA: External Labs: Collected: 12/06/2022 provided by referring physician. A1c 5.7 Hemoglobin 14.2, hematocrit 42.8% BUN 14, creatinine 1.02 Sodium 141, potassium 4.5, chloride 107, bicarb 28. AST 24, ALT 30, alkaline phosphatase 66  TSH 2.44 Total cholesterol 167, triglycerides 219, HDL 46, LDL calculated 85, non-HDL 122 High sensitive troponin 13  IMPRESSION:    ICD-10-CM   1. Unstable angina (HCC)  I20.0 EKG 12-Lead    aspirin EC 81 MG tablet    nitroGLYCERIN (NITROSTAT) 0.4 MG SL tablet    2. Benign hypertension  I10     3. Mixed  hyperlipidemia  E78.2     4. Class 1 obesity due to excess calories without serious comorbidity with body mass index (BMI) of 32.0 to 32.9 in adult  E66.09    Z68.32        RECOMMENDATIONS: Amy Kerr is a 72 y.o. Caucasian female whose past medical history and cardiac risk factors include: Hypertension, hyperlipidemia, GERD, vitamin D deficiency, atherosclerosis of the aorta.  Unstable angina (HCC) Discomfort started last week. Progressively getting worse with regards to frequent episodes. EKG shows sinus rhythm with TWI in the high lateral & lateral leads suggestive of anterolateral ischemia. Anginal symptoms of worsening with household activities. No active chest pain during the visit. No significant change after starting PPI per PCP. Symptoms are concerning for unstable angina.  Spoke to interventional cardiologist Dr. Einar Gip who was kind to schedule heart catheterization today to evaluate for obstructive CAD.  If she is noted to have obstructive CAD warranting admission will request the bed after the heart catheterization.  Risks, benefits,  alternatives, and limitations discussed with both the patient and her husband and they are willing to proceed with invasive left heart catheterization with possible intervention. Outside labs from March 2024 independently reviewed and noted above. Coordination of care with interventional cardiologist, Cath Lab, and admitting. Start aspirin 81 mg p.o. daily. Continue statin therapy as prescribed- may need up titration. Sublingual nitroglycerin tablets to use on appearing basis.  Benign hypertension Office blood pressures are not well-controlled. Home blood pressures range between 120-135 mmHg per patient. Continue current antihypertensive medications.  Mixed hyperlipidemia Currently on atorvastatin.   She denies myalgia or other side effects. Most recent lipids dated March 2024, independently reviewed as noted above. Currently managed by  primary care provider.  Class 1 obesity due to excess calories without serious comorbidity with body mass index (BMI) of 32.0 to 32.9 in adult Body mass index is 32.61 kg/m. I reviewed with her importance of diet, regular physical activity/exercise, weight loss.   Patient is educated on the importance of increasing physical activity gradually as tolerated with a goal of moderate intensity exercise for 30 minutes a day 5 days a week.  Data Reviewed: I have independently reviewed external notes provided by the referring provider as part of this office visit.   I have independently reviewed results of labs, EKG as part of medical decision making. I have ordered the following tests:  Orders Placed This Encounter  Procedures   EKG 12-Lead   I have made medications changes at today's encounter as noted above. History of present illness was obtained by both patient and husband at today's office visit.    FINAL MEDICATION LIST END OF ENCOUNTER: Meds ordered this encounter  Medications   aspirin EC 81 MG tablet    Sig: Take 1 tablet (81 mg total) by mouth daily. Swallow whole.    Dispense:  30 tablet    Refill:  12   nitroGLYCERIN (NITROSTAT) 0.4 MG SL tablet    Sig: Place 1 tablet (0.4 mg total) under the tongue every 5 (five) minutes as needed for chest pain. If you require more than two tablets five minutes apart go to the nearest ER via EMS.    Dispense:  30 tablet    Refill:  0    Medications Discontinued During This Encounter  Medication Reason   aspirin 81 MG tablet Patient Preference   Calcium Carbonate-Vitamin D (CALCIUM + D PO) Patient Preference   cyclobenzaprine (FLEXERIL) 10 MG tablet Patient Preference   diclofenac sodium (VOLTAREN) 1 % GEL Patient Preference   gabapentin (NEURONTIN) 300 MG capsule Patient Preference   methylPREDNISolone (MEDROL) 4 MG tablet Patient Preference   traMADol (ULTRAM) 50 MG tablet Patient Preference   naproxen (NAPROSYN) 500 MG tablet Patient  Preference     Current Outpatient Medications:    acetaminophen-codeine (TYLENOL #3) 300-30 MG tablet, Take 1-2 tablets by mouth every 8 (eight) hours as needed., Disp: 30 tablet, Rfl: 0   amLODipine-atorvastatin (CADUET) 5-10 MG per tablet, Take 1 tablet by mouth daily., Disp: , Rfl:    aspirin EC 81 MG tablet, Take 1 tablet (81 mg total) by mouth daily. Swallow whole., Disp: 30 tablet, Rfl: 12   Cholecalciferol (VITAMIN D-3) 25 MCG (1000 UT) CAPS, Take 2,000 Units by mouth daily at 2 PM., Disp: , Rfl:    nitroGLYCERIN (NITROSTAT) 0.4 MG SL tablet, Place 1 tablet (0.4 mg total) under the tongue every 5 (five) minutes as needed for chest pain. If you require more than two tablets  five minutes apart go to the nearest ER via EMS., Disp: 30 tablet, Rfl: 0   olmesartan (BENICAR) 20 MG tablet, Take 20 mg by mouth daily., Disp: , Rfl:    pantoprazole (PROTONIX) 40 MG tablet, Take 40 mg by mouth daily., Disp: , Rfl:   Orders Placed This Encounter  Procedures   EKG 12-Lead    There are no Patient Instructions on file for this visit.   --Continue cardiac medications as reconciled in final medication list. --Return in about 1 week (around 12/28/2022) for s/p Left heart catheterization. or sooner if needed. --Continue follow-up with your primary care physician regarding the management of your other chronic comorbid conditions.  Patient's questions and concerns were addressed to her satisfaction. She voices understanding of the instructions provided during this encounter.   This note was created using a voice recognition software as a result there may be grammatical errors inadvertently enclosed that do not reflect the nature of this encounter. Every attempt is made to correct such errors.  Rex Kras, [place], [facility]  Pager:  [phone] Office: [phone]

## 2022-12-21 NOTE — Interval H&P Note (Signed)
History and Physical Interval Note:  12/21/2022 3:28 PM  Amy Kerr  has presented today for surgery, with the diagnosis of angina.  The various methods of treatment have been discussed with the patient and family. After consideration of risks, benefits and other options for treatment, the patient has consented to  Procedure(s): LEFT HEART CATH AND CORONARY ANGIOGRAPHY (N/A) as a surgical intervention.  The patient's history has been reviewed, patient examined, no change in status, stable for surgery.  I have reviewed the patient's chart and labs.  Questions were answered to the patient's satisfaction.    2016 Appropriate Use Criteria for Coronary Revascularization in Patients With Acute Coronary Syndrome NSTEMI/Unstable angina, stabilized patient at Intermediate Risk (TIMI Score 3-4) Indication:  Revascularization by PCI or CABG of 1 or more arteries in a patient with NSTEMI or unstable angina with Stabilization after presentation Intermediate risk for clinical events A (7) Indication: 16; Score 7   [place]

## 2022-12-21 NOTE — Addendum Note (Signed)
Addended by: Oran Rein on: 12/21/2022 06:43 PM   Modules accepted: Orders

## 2022-12-22 ENCOUNTER — Encounter (HOSPITAL_COMMUNITY): Payer: Self-pay | Admitting: Cardiology

## 2022-12-22 ENCOUNTER — Other Ambulatory Visit (HOSPITAL_COMMUNITY): Payer: Self-pay

## 2022-12-22 DIAGNOSIS — E782 Mixed hyperlipidemia: Secondary | ICD-10-CM | POA: Diagnosis not present

## 2022-12-22 DIAGNOSIS — I10 Essential (primary) hypertension: Secondary | ICD-10-CM | POA: Diagnosis not present

## 2022-12-22 DIAGNOSIS — I700 Atherosclerosis of aorta: Secondary | ICD-10-CM | POA: Diagnosis not present

## 2022-12-22 DIAGNOSIS — I25110 Atherosclerotic heart disease of native coronary artery with unstable angina pectoris: Secondary | ICD-10-CM | POA: Diagnosis not present

## 2022-12-22 DIAGNOSIS — E6609 Other obesity due to excess calories: Secondary | ICD-10-CM | POA: Diagnosis not present

## 2022-12-22 DIAGNOSIS — Z6832 Body mass index (BMI) 32.0-32.9, adult: Secondary | ICD-10-CM | POA: Diagnosis not present

## 2022-12-22 DIAGNOSIS — Z8249 Family history of ischemic heart disease and other diseases of the circulatory system: Secondary | ICD-10-CM | POA: Diagnosis not present

## 2022-12-22 DIAGNOSIS — K219 Gastro-esophageal reflux disease without esophagitis: Secondary | ICD-10-CM | POA: Diagnosis not present

## 2022-12-22 LAB — BASIC METABOLIC PANEL
Anion gap: 8 (ref 5–15)
BUN: 9 mg/dL (ref 8–23)
CO2: 20 mmol/L — ABNORMAL LOW (ref 22–32)
Calcium: 8.2 mg/dL — ABNORMAL LOW (ref 8.9–10.3)
Chloride: 109 mmol/L (ref 98–111)
Creatinine, Ser: 0.87 mg/dL (ref 0.44–1.00)
GFR, Estimated: >60 mL/min (ref >60)
Glucose, Bld: 126 mg/dL — ABNORMAL HIGH (ref 70–99)
Potassium: 3.7 mmol/L (ref 3.5–5.1)
Sodium: 137 mmol/L (ref 135–145)

## 2022-12-22 LAB — CBC
HCT: 38.6 % (ref 36.0–46.0)
Hemoglobin: 13.3 g/dL (ref 12.0–15.0)
MCH: 30.5 pg (ref 26.0–34.0)
MCHC: 34.5 g/dL (ref 30.0–36.0)
MCV: 88.5 fL (ref 80.0–100.0)
Platelets: 251 10*3/uL (ref 150–400)
RBC: 4.36 MIL/uL (ref 3.87–5.11)
RDW: 12.8 % (ref 11.5–15.5)
WBC: 8.7 10*3/uL (ref 4.0–10.5)
nRBC: 0 % (ref 0.0–0.2)

## 2022-12-22 MED ORDER — AMLODIPINE BESYLATE 10 MG PO TABS
10.0000 mg | ORAL_TABLET | Freq: Every morning | ORAL | 0 refills | Status: DC
  Filled 2022-12-22: qty 90, 90d supply, fill #0

## 2022-12-22 MED ORDER — OLMESARTAN MEDOXOMIL 20 MG PO TABS: 20.0000 mg | ORAL_TABLET | Freq: Every day | ORAL | 0 refills | Status: DC

## 2022-12-22 MED ORDER — ASPIRIN 81 MG PO TBEC
81.0000 mg | DELAYED_RELEASE_TABLET | Freq: Every day | ORAL | 3 refills | Status: AC
  Filled 2022-12-22: qty 90, 90d supply, fill #0

## 2022-12-22 MED ORDER — METOPROLOL SUCCINATE ER 25 MG PO TB24
25.0000 mg | ORAL_TABLET | Freq: Every morning | ORAL | 11 refills | Status: DC
  Filled 2022-12-22: qty 30, 30d supply, fill #0

## 2022-12-22 MED ORDER — ROSUVASTATIN CALCIUM 40 MG PO TABS
40.0000 mg | ORAL_TABLET | Freq: Every evening | ORAL | 3 refills | Status: DC
  Filled 2022-12-22: qty 90, 90d supply, fill #0

## 2022-12-22 MED ORDER — TICAGRELOR 90 MG PO TABS
90.0000 mg | ORAL_TABLET | Freq: Two times a day (BID) | ORAL | 3 refills | Status: DC
  Filled 2022-12-22: qty 180, 90d supply, fill #0

## 2022-12-22 NOTE — Discharge Summary (Signed)
Physician Discharge Summary  Patient ID: Amy Kerr MRN: [id number] DOB/AGE: Mar 29, 1951 72 y.o.  Admit date: 12/21/2022 Discharge date: 12/22/2022  Primary Discharge Diagnosis: Unstable angina Established establish coronary artery disease status post PCI to [place]  Secondary Discharge Diagnosis: Hypertension. Hyperlipidemia. Aortic aortic atherosclerosis. GERD. Vitamin D deficiency  Hospital Course:   72 y.o. Caucasian female medical history includes hypertension, hyperlipidemia, vitamin D deficiency, aortic atherosclerosis.  Patient presented to the office yesterday on 12/21/2022 for evaluation of chest pain.  Her symptoms were very concerning for unstable angina.  She was scheduled for outpatient heart catheterization the same day and was found to have obstructive CAD.  Culprit vessel being first obtuse marginal she underwent angioplasty and stenting.  She was admitted overnight for observation given the unstable angina presentation and status post PCI.  Overnight, denies anginal discomfort or discomfort, no heart failure symptoms, and no reperfusion arrhythmia on telemetry.  She has been evaluated cardiac rehab.  She is wanting to go home.   Discharge Exam: Temp:  [97.4 F (36.3 C)-98.7 F (37.1 C)] 98.1 F (36.7 C) (03/20 0832) Pulse Rate:  [80-85] 81 (03/20 0832) Cardiac Rhythm: Normal sinus rhythm (03/20 0748) Resp:  [14-18] 16 (03/20 0832) BP: (136-170)/(63-92) 144/63 (03/20 0930) SpO2:  [94 %-100 %] 94 % (03/20 0832) Weight:  [86.2 kg] 86.2 kg (03/19 1249)  Today's Vitals   12/22/22 0037 12/22/22 0454 12/22/22 0832 12/22/22 0930  BP: (!) 151/81 (!) 154/82 (!) 151/68 (!) 144/63  Pulse:   81   Resp: 14 16 16    Temp: 97.9 F (36.6 C) 98.4 F (36.9 C) 98.1 F (36.7 C)   TempSrc: Oral Oral Oral   SpO2: 96% 97% 94%   Weight:      Height:      PainSc:    0-No pain   Body mass index is 32.61 kg/m.  Physical Exam  Constitutional: No distress.  Age  appropriate, hemodynamically stable.   Neck: No JVD present.  Cardiovascular: Normal rate, regular rhythm, S1 normal, S2 normal, intact distal pulses and normal pulses. Exam reveals no gallop, no S3 and no S4.  No murmur heard. Right radial site is well-healed, no hematoma or bruit appreciated, 2+ pulse.  Pulmonary/Chest: Effort normal and breath sounds normal. No stridor. She has no wheezes. She has no rales.  Abdominal: Soft. Bowel sounds are normal. She exhibits no distension. There is no abdominal tenderness.  Musculoskeletal:        General: No edema.     Cervical back: Neck supple.  Neurological: She is alert and oriented to person, place, and time. She has intact cranial nerves (2-12).  Skin: Skin is warm and moist.   Recommendations on discharge:  1.  Dual antiplatelet therapy for 12 months. 2.  Transition to high intensity statin therapy. 3.  Cardiac rehab. 4.  Outpatient echocardiogram to reevaluate LVEF and structural heart disease. 5.  Educated on the importance of educated on the importance of improving her modifiable cardiovascular risk factors. 6.  TOC visit on 12/28/2022 at 11:15 AM  Did the patient have an acute coronary syndrome (MI, NSTEMI, STEMI, etc) this admission?: Yes                               AHA/ACC Clinical Performance & Quality Measures: Aspirin prescribed? - Yes ADP Receptor Inhibitor (Plavix/Clopidogrel, Brilinta/Ticagrelor or Effient/Prasugrel) prescribed (includes medically managed patients)? - Yes Beta Blocker prescribed? - Yes High Intensity  Statin (Lipitor 40-80mg  or Crestor 20-40mg ) prescribed? - Yes EF assessed during THIS hospitalization? - Yes For EF <40%, was ACEI/ARB prescribed? - Not Applicable (EF >/= [id number]) For EF <40%, Aldosterone Antagonist (Spironolactone or Eplerenone) prescribed? - Not Applicable (EF >/= [id number]) Cardiac Rehab Phase II ordered (including medically managed patients)? - Yes '  CARDIAC DATABASE: EKG: 12/21/2022: Sinus  rhythm, 79 bpm, TWI in high lateral and lateral leads suggestive of anterolateral ischemia.  No prior EKGs available for review.  12/22/2022: Sinus rhythm, 77 bpm, ST-Tchanges in the high lateral and lateral leads concerning for anterolateral ischemia   Echocardiogram: No results found for this or any previous visit from the past 1095 days.     Stress Testing: No results found for this or any previous visit from the past 1095 days.     Heart Catheterization: 12/21/2022 LM: Normal LAD: Mid 30% disease         High diag 1 with mild luminal irregularities Lcx: Prox OM1 with focal 95% stenosis RCA: Mid tandem 30% and 40% stenoses   LVEDP normal LVEF normal   Presentation with unstable angina Successful percutaneous coronary intervention prox OM1        PTCA and stent placement 3.0 X 16 mm Synergy drug-eluting stent        Post dilatation with 3.0X12 mm [place] balloon up to 20 atm Medical management for rest of the nonobstructive disease              LABORATORY DATA: External Labs: Collected: 12/06/2022 provided by referring physician. A1c 5.7 Hemoglobin 14.2, hematocrit 42.8% BUN 14, creatinine 1.02 Sodium 141, potassium 4.5, chloride 107, bicarb 28. AST 24, ALT 30, alkaline phosphatase 66  TSH 2.44 Total cholesterol 167, triglycerides 219, HDL 46, LDL calculated 85, non-HDL 122 High sensitive troponin 13   Lab Results  Component Value Date   WBC 8.7 12/22/2022   HGB 13.3 12/22/2022   HCT 38.6 12/22/2022   MCV 88.5 12/22/2022   PLT 251 12/22/2022    Recent Labs  Lab 12/22/22 0141  NA 137  K 3.7  CL 109  CO2 20*  BUN 9  CREATININE 0.87  CALCIUM 8.2*  GLUCOSE 126*    Radiology: CARDIAC CATHETERIZATION  Result Date: 12/21/2022 Images from the original result were not included. LM: Normal LAD: Mid 30% disease         High diag 1 with mild luminal irregularities Lcx: Prox OM1 with focal 95% stenosis RCA: Mid tandem 30% and 40% stenoses LVEDP normal LVEF normal  Presentation with unstable angina Successful percutaneous coronary intervention prox OM1        PTCA and stent placement 3.0 X 16 mm Synergy drug-eluting stent        Post dilatation with 3.0X12 mm  balloon up to 20 atm Medical management for rest of the nonobstructive disease  Nigel Mormon, MD Pager: [phone] Office: [phone]      FOLLOW UP PLANS AND APPOINTMENTS Discharge Instructions     Amb Referral to Cardiac Rehabilitation   Complete by: As directed    Diagnosis: Coronary Stents   After initial evaluation and assessments completed: Virtual Based Care may be provided alone or in conjunction with Phase 2 Cardiac Rehab based on patient barriers.: Yes   Intensive Cardiac Rehabilitation (ICR) [place] location only OR Traditional Cardiac Rehabilitation (TCR) *If criteria for ICR are not met will enroll in TCR [facility] only): Yes      Allergies as of 12/22/2022  Reactions   Codeine Nausea Only   Morphine Nausea Only   Zinc Nausea Only        Medication List     STOP taking these medications    amLODipine-atorvastatin 5-10 MG tablet Commonly known as: CADUET   ibuprofen 200 MG tablet Commonly known as: ADVIL   pantoprazole 40 MG tablet Commonly known as: PROTONIX       TAKE these medications    amLODipine 10 MG tablet Commonly known as: NORVASC Take 1 tablet (10 mg total) by mouth every morning.   aspirin EC 81 MG tablet Take 1 tablet (81 mg total) by mouth daily. Swallow whole.   Brilinta 90 MG Tabs tablet Generic drug: ticagrelor Take 1 tablet (90 mg total) by mouth 2 (two) times daily. What changed: You were already taking a medication with the same name, and this prescription was added. Make sure you understand how and when to take each.   ticagrelor 90 MG Tabs tablet Commonly known as: BRILINTA Take 1 tablet (90 mg total) by mouth 2 (two) times daily. What changed: Another medication with the same name was added. Make sure you understand how  and when to take each.   clobetasol ointment 0.05 % Commonly known as: TEMOVATE Apply 1 Application topically as needed (rash).   fluticasone 50 MCG/ACT nasal spray Commonly known as: FLONASE Place 1 spray into both nostrils as needed for allergies or rhinitis.   metoprolol succinate 25 MG 24 hr tablet Commonly known as: Toprol XL Take 1 tablet (25 mg total) by mouth every morning. Hold if systolic blood pressure (top number) less than 100 mmHg or pulse less than 60 bpm.   nitroGLYCERIN 0.4 MG SL tablet Commonly known as: Nitrostat Place 1 tablet (0.4 mg total) under the tongue every 5 (five) minutes as needed for chest pain. If you require more than two tablets five minutes apart go to the nearest ER via EMS.   olmesartan 20 MG tablet Commonly known as: BENICAR Take 1 tablet (20 mg total) by mouth daily at 10 pm. What changed: when to take this   rosuvastatin 40 MG tablet Commonly known as: CRESTOR Take 1 tablet (40 mg total) by mouth at bedtime.   Vitamin D-3 25 MCG (1000 UT) Caps Take 2,000 Units by mouth daily.        Follow-up Information     Elida Harbin, DO. Go on 12/28/2022.   Specialties: Cardiology, Vascular Surgery Why: 11:15am Contact information: [place] [address] [phone]                Total time spent on patient's discharge was 38 minutes.  Rex Kras, [place], [facility]  Pager:  [phone] Office: [phone]

## 2022-12-22 NOTE — Progress Notes (Signed)
AVS instruction given. IV removed

## 2022-12-22 NOTE — Progress Notes (Signed)
CARDIAC REHAB PHASE 1    Pt in bed, feeling well this morning. Pt has walked in room and back forth to bathroom several times last night and this morning. Pt reports tolerating well with no CP, dizziness, or SOB. Post stent education including site care, restrictions, risk factors, heart healthy diet, exercise guidelines, antiplatelet therapy importance and CRP2 reviewed with pt and husband. All questions and concerns addressed. Will refer to [facility] for CRP2.    [id number] Vanessa Barbara, RN BSN 12/22/2022 9:09 AM

## 2022-12-23 ENCOUNTER — Ambulatory Visit: Payer: Medicare Other

## 2022-12-23 ENCOUNTER — Telehealth: Payer: Self-pay

## 2022-12-23 DIAGNOSIS — I10 Essential (primary) hypertension: Secondary | ICD-10-CM | POA: Diagnosis not present

## 2022-12-23 DIAGNOSIS — I200 Unstable angina: Secondary | ICD-10-CM | POA: Diagnosis not present

## 2022-12-23 LAB — LIPOPROTEIN A (LPA): Lipoprotein (a): 42.6 nmol/L — ABNORMAL HIGH (ref <75.0)

## 2022-12-23 NOTE — Telephone Encounter (Signed)
Location of hospitalization: [place] Reason for hospitalization: chest pain Date of discharge: 12/24/2022 Date of first communication with patient: today Person contacting patient: Me Current symptoms: none Do you understand why you were in the Hospital: Yes Questions regarding discharge instructions: None Where were you discharged to: Home Medications reviewed: Yes Allergies reviewed: Yes Dietary changes reviewed: Yes. Discussed low fat and low salt diet.  Referals reviewed: NA Activities of Daily Living: Able to with mild limitations Any transportation issues/concerns: None Any patient concerns: None Confirmed importance & date/time of Follow up appt: Yes Confirmed with patient if condition begins to worsen call. Pt was given the office number and encouraged to call back with questions or concerns: Yes

## 2022-12-23 NOTE — Telephone Encounter (Signed)
TOC DONE

## 2022-12-28 ENCOUNTER — Ambulatory Visit: Payer: Medicare Other | Admitting: Cardiology

## 2022-12-28 ENCOUNTER — Encounter: Payer: Self-pay | Admitting: Cardiology

## 2022-12-28 VITALS — BP 142/81 | HR 81 | Resp 17 | Ht 64.0 in | Wt 180.4 lb

## 2022-12-28 DIAGNOSIS — I700 Atherosclerosis of aorta: Secondary | ICD-10-CM | POA: Diagnosis not present

## 2022-12-28 DIAGNOSIS — I10 Essential (primary) hypertension: Secondary | ICD-10-CM | POA: Diagnosis not present

## 2022-12-28 DIAGNOSIS — E782 Mixed hyperlipidemia: Secondary | ICD-10-CM | POA: Diagnosis not present

## 2022-12-28 DIAGNOSIS — I2510 Atherosclerotic heart disease of native coronary artery without angina pectoris: Secondary | ICD-10-CM

## 2022-12-28 DIAGNOSIS — Z6832 Body mass index (BMI) 32.0-32.9, adult: Secondary | ICD-10-CM

## 2022-12-28 DIAGNOSIS — Z95820 Peripheral vascular angioplasty status with implants and grafts: Secondary | ICD-10-CM

## 2022-12-28 MED ORDER — OLMESARTAN MEDOXOMIL 40 MG PO TABS: 40.0000 mg | ORAL_TABLET | Freq: Every day | ORAL | 0 refills | Status: DC

## 2022-12-28 MED ORDER — EMPAGLIFLOZIN 10 MG PO TABS: 10.0000 mg | ORAL_TABLET | Freq: Every day | ORAL | 0 refills | Status: DC

## 2022-12-28 NOTE — Progress Notes (Signed)
ID:  Amy Egehyllis M Mclennan, DOB 08-Dec-1950, MRN [id number]  PCP:  Georgann HousekeeperHusain, Karrar, MD  Cardiologist:  Tessa LernerSunit Lyanne Kates, DO, [facility] (established care 12/21/2022)  Date: 12/28/22 Last Office Visit: 12/21/2022  Chief Complaint  Patient presents with   Transitions Of Care    s/p pci for unstable angina   Chest Pain    HPI  Amy Kerr is a 72 y.o. Caucasian female whose past medical history and cardiovascular risk factors include: History of unstable angina 12/2022 status post PCI to OM1, hypertension, hyperlipidemia, GERD, vitamin D deficiency, atherosclerosis of the aorta.  Patient presented to the office on 12/21/2022 as a new consult for chest pain.  Her symptoms were very concerning for unstable angina.  She had a heart catheterization the same day was found to have obstructive CAD in the OM1.  She ruled in for unstable angina and was admitted to the hospital for overnight observation and later discharged home.  She presents today for follow-up.  Since discharge patient has not experienced anginal chest pain but still has some mild discomfort over the left precordium.  She denies all shortness of breath at rest or with effort related activities.  Overall functional capacity remains stable.  FUNCTIONAL STATUS: No structured exercise program or daily routine.   ALLERGIES: Allergies  Allergen Reactions   Codeine Nausea Only   [name] [Morphine] Nausea Only   Zinc Nausea Only    MEDICATION LIST PRIOR TO VISIT: Current Meds  Medication Sig   amLODipine (NORVASC) 10 MG tablet Take 1 tablet (10 mg total) by mouth every morning. (Patient taking differently: Take 10 mg by mouth daily.)   aspirin EC 81 MG tablet Take 1 tablet (81 mg total) by mouth daily. Swallow whole.   Cholecalciferol (VITAMIN D-3) 25 MCG (1000 UT) CAPS Take 2,000 Units by mouth daily.   metoprolol succinate (TOPROL XL) 25 MG 24 hr tablet Take 1 tablet (25 mg total) by mouth every morning. Hold if systolic blood pressure (top  number) less than 100 mmHg or pulse less than 60 bpm. (Patient taking differently: Take 25 mg by mouth See admin instructions. 25 mg once daily at lunch. Hold if SBP < 100 or pulse < 60.)   nitroGLYCERIN (NITROSTAT) 0.4 MG SL tablet Place 1 tablet (0.4 mg total) under the tongue every 5 (five) minutes as needed for chest pain. If you require more than two tablets five minutes apart go to the nearest ER via EMS.   ticagrelor (BRILINTA) 90 MG TABS tablet Take 1 tablet (90 mg total) by mouth 2 (two) times daily.   [DISCONTINUED] clobetasol ointment (TEMOVATE) 0.05 % Apply 1 Application topically as needed (rash).   [DISCONTINUED] empagliflozin (JARDIANCE) 10 MG TABS tablet Take 1 tablet (10 mg total) by mouth daily before breakfast.   [DISCONTINUED] fluticasone (FLONASE) 50 MCG/ACT nasal spray Place 1 spray into both nostrils as needed for allergies or rhinitis.   [DISCONTINUED] olmesartan (BENICAR) 20 MG tablet Take 1 tablet (20 mg total) by mouth daily at 10 pm.   [DISCONTINUED] olmesartan (BENICAR) 40 MG tablet Take 1 tablet (40 mg total) by mouth daily at 10 pm. (Patient taking differently: Take 40 mg by mouth at bedtime.)   [DISCONTINUED] rosuvastatin (CRESTOR) 40 MG tablet Take 1 tablet (40 mg total) by mouth at bedtime.     PAST MEDICAL HISTORY: Past Medical History:  Diagnosis Date   Coronary artery disease    Hyperlipidemia    Hypertension     PAST SURGICAL HISTORY:  Past Surgical History:  Procedure Laterality Date   COLONOSCOPY N/A 10/23/2013   Procedure: COLONOSCOPY;  Surgeon: Charolett Bumpers, MD;  Location: [facility];  Service: Endoscopy;  Laterality: N/A;   CORONARY STENT INTERVENTION N/A 12/21/2022   Procedure: CORONARY STENT INTERVENTION;  Surgeon: Elder Negus, MD;  Location: [facility];  Service: Cardiovascular;  Laterality: N/A;   GALLBLADDER SURGERY     LAPAROSCOPIC APPENDECTOMY N/A 03/22/2013   Procedure: APPENDECTOMY LAPAROSCOPIC;  Surgeon: Currie Paris, MD;  Location: [facility] OR;  Service: General;  Laterality: N/A;   LEFT HEART CATH AND CORONARY ANGIOGRAPHY N/A 12/21/2022   Procedure: LEFT HEART CATH AND CORONARY ANGIOGRAPHY;  Surgeon: Elder Negus, MD;  Location: [facility];  Service: Cardiovascular;  Laterality: N/A;    FAMILY HISTORY: The patient family history includes Cancer - Cervical in her mother; Esophageal cancer in her sister; Heart disease in her brother and father; Stomach cancer in her brother.  SOCIAL HISTORY:  The patient  reports that she has never smoked. She has never used smokeless tobacco. She reports that she does not drink alcohol and does not use drugs.  REVIEW OF SYSTEMS: Review of Systems  Cardiovascular:  Negative for chest pain, claudication, dyspnea on exertion, irregular heartbeat, leg swelling, near-syncope, orthopnea, palpitations, paroxysmal nocturnal dyspnea and syncope.  Respiratory:  Negative for shortness of breath.   Hematologic/Lymphatic: Negative for bleeding problem.  Musculoskeletal:  Negative for muscle cramps and myalgias.  Neurological:  Negative for dizziness and light-headedness.    PHYSICAL EXAM:    01/05/2023    8:27 AM 01/05/2023    3:35 AM 01/04/2023    8:12 PM  Vitals with BMI  Systolic 126 128 751  Diastolic 61 68 75  Pulse 65 67     Physical Exam  Constitutional: No distress.  Age appropriate, hemodynamically stable.   Neck: No JVD present.  Cardiovascular: Normal rate, regular rhythm, S1 normal, S2 normal, intact distal pulses and normal pulses. Exam reveals no gallop, no S3 and no S4.  No murmur heard. Pulmonary/Chest: Effort normal and breath sounds normal. No stridor. She has no wheezes. She has no rales.  Abdominal: Soft. Bowel sounds are normal. She exhibits no distension. There is no abdominal tenderness.  Musculoskeletal:        General: No edema.     Cervical back: Neck supple.  Neurological: She is alert and oriented to person, place, and time.  She has intact cranial nerves (2-12).  Skin: Skin is warm and moist.   CARDIAC DATABASE: EKG: 12/28/2022: Sinus rhythm, 78 bpm, TWI in the high lateral, lateral leads concerning for anterolateral ischemia.  Similar findings on prior EKG 12/21/2022.  Echocardiogram: 12/23/2022:  Normal LV systolic function with visual EF 55-60%. Left ventricle cavity  is normal in size. Normal left ventricular wall thickness. Normal global  wall motion. Indeterminate diastolic filling pattern. Calculated EF 60%.  Mild (Grade I) mitral regurgitation. Mild calcification of the mitral  valve annulus. Mild mitral valve leaflet thickening.  Structurally normal tricuspid valve with no regurgitation. No evidence of  pulmonary hypertension.  No prior available for comparison.   Stress Testing: No results found for this or any previous visit from the past 1095 days.  Heart Catheterization: 12/21/2022 LM: Normal LAD: Mid 30% disease         High diag 1 with mild luminal irregularities Lcx: Prox OM1 with focal 95% stenosis RCA: Mid tandem 30% and 40% stenoses   LVEDP normal LVEF  normal   Presentation with unstable angina Successful percutaneous coronary intervention prox OM1        PTCA and stent placement 3.0 X 16 mm Synergy drug-eluting stent        Post dilatation with 3.0X12 mm [place] balloon up to 20 atm Medical management for rest of the nonobstructive disease      LABORATORY DATA: External Labs: Collected: 12/06/2022 provided by referring physician. A1c 5.7 Hemoglobin 14.2, hematocrit 42.8% BUN 14, creatinine 1.02 Sodium 141, potassium 4.5, chloride 107, bicarb 28. AST 24, ALT 30, alkaline phosphatase 66  TSH 2.44 Total cholesterol 167, triglycerides 219, HDL 46, LDL calculated 85, non-HDL 122 High sensitive troponin 13  No results found for: "CHOL", "HDL", "LDLCALC", "LDLDIRECT", "TRIG", "CHOLHDL"    IMPRESSION:    ICD-10-CM   1. Coronary artery disease involving native coronary artery  of native heart without angina pectoris  I25.10 EKG 12-Lead    Basic metabolic panel    Magnesium    DISCONTINUED: olmesartan (BENICAR) 40 MG tablet    DISCONTINUED: empagliflozin (JARDIANCE) 10 MG TABS tablet    2. S/P angioplasty with stent  Z95.820     3. Atherosclerosis of aorta (HCC)  I70.0     4. Benign hypertension  I10     5. Mixed hyperlipidemia  E78.2     6. Class 1 obesity due to excess calories without serious comorbidity with body mass index (BMI) of 32.0 to 32.9 in adult  E66.09    Z68.32        RECOMMENDATIONS: Amy Egehyllis M Carranco is a 72 y.o. Caucasian female whose past medical history and cardiac risk factors include: Hypertension, hyperlipidemia, GERD, vitamin D deficiency, atherosclerosis of the aorta.  Coronary artery disease involving native coronary artery of native heart without angina pectoris S/P angioplasty with stent Presented with unstable angina in March 2024. Underwent angiography urgently and was noted to have obstructive disease in the OM distribution status post coronary intervention. Dual antiplatelet therapy for 12 months. Continue high intensity statin. Reemphasized importance of cardiac rehab Echocardiogram results reviewed and noted above for further reference. Start Jardiance 10 mg p.o. daily. Recommend LDL less than 55 mg/dL and triglyceride levels less than 149 mg/dL  Atherosclerosis of aorta (HCC) Continue antiplatelet therapy and statin therapy  Benign hypertension Office blood pressures are not well-controlled. Uptitrate olmesartan from 20 mg to 40 mg p.o. every afternoon Labs in 1 week to evaluate kidney function and electrolytes  Mixed hyperlipidemia Currently on rosuvastatin.   She denies myalgia or other side effects. Most recent lipids dated March 2024, independently reviewed as noted above. Currently managed by primary care provider.  Class 1 obesity due to excess calories without serious comorbidity with body mass index  (BMI) of 32.0 to 32.9 in adult Body mass index is 30.97 kg/m. I reviewed with her importance of diet, regular physical activity/exercise, weight loss.   Patient is educated on the importance of increasing physical activity gradually as tolerated with a goal of moderate intensity exercise for 30 minutes a day 5 days a week.  FINAL MEDICATION LIST END OF ENCOUNTER: Meds ordered this encounter  Medications   DISCONTD: olmesartan (BENICAR) 40 MG tablet    Sig: Take 1 tablet (40 mg total) by mouth daily at 10 pm.    Dispense:  90 tablet    Refill:  0   DISCONTD: empagliflozin (JARDIANCE) 10 MG TABS tablet    Sig: Take 1 tablet (10 mg total) by mouth daily before breakfast.  Dispense:  30 tablet    Refill:  0    Medications Discontinued During This Encounter  Medication Reason   ticagrelor (BRILINTA) 90 MG TABS tablet    olmesartan (BENICAR) 20 MG tablet Dose change     Current Outpatient Medications:    amLODipine (NORVASC) 10 MG tablet, Take 1 tablet (10 mg total) by mouth every morning. (Patient taking differently: Take 10 mg by mouth daily.), Disp: 90 tablet, Rfl: 0   aspirin EC 81 MG tablet, Take 1 tablet (81 mg total) by mouth daily. Swallow whole., Disp: 90 tablet, Rfl: 3   Cholecalciferol (VITAMIN D-3) 25 MCG (1000 UT) CAPS, Take 2,000 Units by mouth daily., Disp: , Rfl:    metoprolol succinate (TOPROL XL) 25 MG 24 hr tablet, Take 1 tablet (25 mg total) by mouth every morning. Hold if systolic blood pressure (top number) less than 100 mmHg or pulse less than 60 bpm. (Patient taking differently: Take 25 mg by mouth See admin instructions. 25 mg once daily at lunch. Hold if SBP < 100 or pulse < 60.), Disp: 30 tablet, Rfl: 11   nitroGLYCERIN (NITROSTAT) 0.4 MG SL tablet, Place 1 tablet (0.4 mg total) under the tongue every 5 (five) minutes as needed for chest pain. If you require more than two tablets five minutes apart go to the nearest ER via EMS., Disp: 30 tablet, Rfl: 0    ticagrelor (BRILINTA) 90 MG TABS tablet, Take 1 tablet (90 mg total) by mouth 2 (two) times daily., Disp: 180 tablet, Rfl: 3  Orders Placed This Encounter  Procedures   Basic metabolic panel   Magnesium   EKG 12-Lead    There are no Patient Instructions on file for this visit.   --Continue cardiac medications as reconciled in final medication list. --Return in about 4 weeks (around 01/25/2023) for Follow up, CAD. or sooner if needed. --Continue follow-up with your primary care physician regarding the management of your other chronic comorbid conditions.  Patient's questions and concerns were addressed to her satisfaction. She voices understanding of the instructions provided during this encounter.   This note was created using a voice recognition software as a result there may be grammatical errors inadvertently enclosed that do not reflect the nature of this encounter. Every attempt is made to correct such errors.  Tessa Lerner, [place], [facility]  Pager:  [phone] Office: [phone]

## 2023-01-02 ENCOUNTER — Encounter (HOSPITAL_COMMUNITY): Payer: Self-pay

## 2023-01-02 ENCOUNTER — Other Ambulatory Visit: Payer: Self-pay

## 2023-01-02 ENCOUNTER — Emergency Department (HOSPITAL_COMMUNITY): Payer: Medicare Other

## 2023-01-02 ENCOUNTER — Inpatient Hospital Stay (HOSPITAL_COMMUNITY)
Admission: EM | Admit: 2023-01-02 | Discharge: 2023-01-05 | DRG: 683 | Disposition: A | Discharge status: Home / Self Care | Payer: Medicare Other | Attending: Internal Medicine | Admitting: Internal Medicine

## 2023-01-02 DIAGNOSIS — Z79899 Other long term (current) drug therapy: Secondary | ICD-10-CM | POA: Diagnosis not present

## 2023-01-02 DIAGNOSIS — I25110 Atherosclerotic heart disease of native coronary artery with unstable angina pectoris: Secondary | ICD-10-CM | POA: Diagnosis not present

## 2023-01-02 DIAGNOSIS — Z955 Presence of coronary angioplasty implant and graft: Secondary | ICD-10-CM | POA: Diagnosis not present

## 2023-01-02 DIAGNOSIS — Z8249 Family history of ischemic heart disease and other diseases of the circulatory system: Secondary | ICD-10-CM

## 2023-01-02 DIAGNOSIS — Z91048 Other nonmedicinal substance allergy status: Secondary | ICD-10-CM | POA: Diagnosis not present

## 2023-01-02 DIAGNOSIS — I10 Essential (primary) hypertension: Secondary | ICD-10-CM | POA: Diagnosis present

## 2023-01-02 DIAGNOSIS — M25519 Pain in unspecified shoulder: Secondary | ICD-10-CM | POA: Diagnosis not present

## 2023-01-02 DIAGNOSIS — Z6832 Body mass index (BMI) 32.0-32.9, adult: Secondary | ICD-10-CM

## 2023-01-02 DIAGNOSIS — E782 Mixed hyperlipidemia: Secondary | ICD-10-CM | POA: Diagnosis present

## 2023-01-02 DIAGNOSIS — Z7902 Long term (current) use of antithrombotics/antiplatelets: Secondary | ICD-10-CM

## 2023-01-02 DIAGNOSIS — E860 Dehydration: Secondary | ICD-10-CM | POA: Diagnosis not present

## 2023-01-02 DIAGNOSIS — Z885 Allergy status to narcotic agent status: Secondary | ICD-10-CM

## 2023-01-02 DIAGNOSIS — R0902 Hypoxemia: Secondary | ICD-10-CM | POA: Diagnosis not present

## 2023-01-02 DIAGNOSIS — R110 Nausea: Secondary | ICD-10-CM | POA: Diagnosis present

## 2023-01-02 DIAGNOSIS — T50995A Adverse effect of other drugs, medicaments and biological substances, initial encounter: Secondary | ICD-10-CM | POA: Diagnosis not present

## 2023-01-02 DIAGNOSIS — E559 Vitamin D deficiency, unspecified: Secondary | ICD-10-CM | POA: Diagnosis present

## 2023-01-02 DIAGNOSIS — Z7984 Long term (current) use of oral hypoglycemic drugs: Secondary | ICD-10-CM | POA: Diagnosis not present

## 2023-01-02 DIAGNOSIS — I2510 Atherosclerotic heart disease of native coronary artery without angina pectoris: Secondary | ICD-10-CM | POA: Diagnosis present

## 2023-01-02 DIAGNOSIS — Z95820 Peripheral vascular angioplasty status with implants and grafts: Secondary | ICD-10-CM

## 2023-01-02 DIAGNOSIS — I700 Atherosclerosis of aorta: Secondary | ICD-10-CM | POA: Diagnosis not present

## 2023-01-02 DIAGNOSIS — R079 Chest pain, unspecified: Secondary | ICD-10-CM | POA: Diagnosis not present

## 2023-01-02 DIAGNOSIS — R630 Anorexia: Secondary | ICD-10-CM | POA: Diagnosis present

## 2023-01-02 DIAGNOSIS — R0602 Shortness of breath: Secondary | ICD-10-CM | POA: Diagnosis not present

## 2023-01-02 DIAGNOSIS — E66811 Obesity, class 1: Secondary | ICD-10-CM

## 2023-01-02 DIAGNOSIS — N179 Acute kidney failure, unspecified: Secondary | ICD-10-CM | POA: Diagnosis not present

## 2023-01-02 DIAGNOSIS — M549 Dorsalgia, unspecified: Secondary | ICD-10-CM | POA: Diagnosis not present

## 2023-01-02 DIAGNOSIS — E6609 Other obesity due to excess calories: Secondary | ICD-10-CM | POA: Diagnosis present

## 2023-01-02 DIAGNOSIS — K219 Gastro-esophageal reflux disease without esophagitis: Secondary | ICD-10-CM | POA: Diagnosis not present

## 2023-01-02 DIAGNOSIS — F419 Anxiety disorder, unspecified: Secondary | ICD-10-CM | POA: Diagnosis present

## 2023-01-02 DIAGNOSIS — R0600 Dyspnea, unspecified: Secondary | ICD-10-CM | POA: Diagnosis not present

## 2023-01-02 DIAGNOSIS — Z1152 Encounter for screening for COVID-19: Secondary | ICD-10-CM | POA: Diagnosis not present

## 2023-01-02 DIAGNOSIS — Z7982 Long term (current) use of aspirin: Secondary | ICD-10-CM | POA: Diagnosis not present

## 2023-01-02 DIAGNOSIS — R6889 Other general symptoms and signs: Secondary | ICD-10-CM | POA: Diagnosis not present

## 2023-01-02 DIAGNOSIS — Z743 Need for continuous supervision: Secondary | ICD-10-CM | POA: Diagnosis not present

## 2023-01-02 LAB — URINALYSIS, ROUTINE W REFLEX MICROSCOPIC
Bilirubin Urine: NEGATIVE
Glucose, UA: >=500 mg/dL — AB
Ketones, ur: 5 mg/dL — AB
Leukocytes,Ua: NEGATIVE
Nitrite: NEGATIVE
Protein, ur: >=300 mg/dL — AB
Specific Gravity, Urine: 1.02 (ref 1.005–1.030)
pH: 5 (ref 5.0–8.0)

## 2023-01-02 LAB — RESP PANEL BY RT-PCR (RSV, FLU A&B, COVID)  RVPGX2
Influenza A by PCR: NEGATIVE
Influenza B by PCR: NEGATIVE
Resp Syncytial Virus by PCR: NEGATIVE
SARS Coronavirus 2 by RT PCR: NEGATIVE

## 2023-01-02 LAB — CBC WITH DIFFERENTIAL/PLATELET
Abs Immature Granulocytes: 0.01 10*3/uL (ref 0.00–0.07)
Basophils Absolute: 0 10*3/uL (ref 0.0–0.1)
Basophils Relative: 1 %
Eosinophils Absolute: 0.2 10*3/uL (ref 0.0–0.5)
Eosinophils Relative: 3 %
HCT: 43.7 % (ref 36.0–46.0)
Hemoglobin: 15.4 g/dL — ABNORMAL HIGH (ref 12.0–15.0)
Immature Granulocytes: 0 %
Lymphocytes Relative: 17 %
Lymphs Abs: 1.2 10*3/uL (ref 0.7–4.0)
MCH: 30.7 pg (ref 26.0–34.0)
MCHC: 35.2 g/dL (ref 30.0–36.0)
MCV: 87.1 fL (ref 80.0–100.0)
Monocytes Absolute: 0.7 10*3/uL (ref 0.1–1.0)
Monocytes Relative: 10 %
Neutro Abs: 5 10*3/uL (ref 1.7–7.7)
Neutrophils Relative %: 69 %
Platelets: 271 10*3/uL (ref 150–400)
RBC: 5.02 MIL/uL (ref 3.87–5.11)
RDW: 12.6 % (ref 11.5–15.5)
WBC: 7.2 10*3/uL (ref 4.0–10.5)
nRBC: 0 % (ref 0.0–0.2)

## 2023-01-02 LAB — COMPREHENSIVE METABOLIC PANEL
ALT: 53 U/L — ABNORMAL HIGH (ref 0–44)
AST: 34 U/L (ref 15–41)
Albumin: 3.8 g/dL (ref 3.5–5.0)
Alkaline Phosphatase: 76 U/L (ref 38–126)
Anion gap: 12 (ref 5–15)
BUN: 24 mg/dL — ABNORMAL HIGH (ref 8–23)
CO2: 20 mmol/L — ABNORMAL LOW (ref 22–32)
Calcium: 8.9 mg/dL (ref 8.9–10.3)
Chloride: 106 mmol/L (ref 98–111)
Creatinine, Ser: 2.09 mg/dL — ABNORMAL HIGH (ref 0.44–1.00)
GFR, Estimated: 25 mL/min — ABNORMAL LOW (ref >60)
Glucose, Bld: 122 mg/dL — ABNORMAL HIGH (ref 70–99)
Potassium: 4.1 mmol/L (ref 3.5–5.1)
Sodium: 138 mmol/L (ref 135–145)
Total Bilirubin: 1.4 mg/dL — ABNORMAL HIGH (ref 0.3–1.2)
Total Protein: 7.5 g/dL (ref 6.5–8.1)

## 2023-01-02 LAB — TROPONIN I (HIGH SENSITIVITY)
Troponin I (High Sensitivity): 9 ng/L (ref <18)
Troponin I (High Sensitivity): 8 ng/L (ref <18)

## 2023-01-02 LAB — D-DIMER, QUANTITATIVE: D-Dimer, Quant: 0.27 ug/mL-FEU (ref 0.00–0.50)

## 2023-01-02 LAB — CK: Total CK: 109 U/L (ref 38–234)

## 2023-01-02 LAB — BRAIN NATRIURETIC PEPTIDE: B Natriuretic Peptide: 19.9 pg/mL (ref 0.0–100.0)

## 2023-01-02 LAB — MAGNESIUM: Magnesium: 2.7 mg/dL — ABNORMAL HIGH (ref 1.7–2.4)

## 2023-01-02 MED ORDER — SODIUM CHLORIDE 0.9 % IV SOLN: INTRAVENOUS | Status: AC

## 2023-01-02 MED ORDER — ORAL CARE MOUTH RINSE: 15.0000 mL | OROMUCOSAL | Status: DC | PRN

## 2023-01-02 MED ORDER — AMLODIPINE BESYLATE 5 MG PO TABS: 10.0000 mg | ORAL_TABLET | Freq: Once | ORAL | Status: DC

## 2023-01-02 MED ORDER — AMLODIPINE BESYLATE 5 MG PO TABS
5.0000 mg | ORAL_TABLET | Freq: Once | ORAL | Status: AC
  Administered 2023-01-02: 5 mg via ORAL
  Filled 2023-01-02: qty 1

## 2023-01-02 MED ORDER — SODIUM CHLORIDE 0.9% FLUSH
3.0000 mL | Freq: Two times a day (BID) | INTRAVENOUS | Status: DC
  Administered 2023-01-02 – 2023-01-04 (×3): 3 mL via INTRAVENOUS

## 2023-01-02 MED ORDER — POLYETHYLENE GLYCOL 3350 17 G PO PACK: 17.0000 g | PACK | Freq: Every day | ORAL | Status: DC | PRN

## 2023-01-02 MED ORDER — ACETAMINOPHEN 325 MG PO TABS: 650.0000 mg | ORAL_TABLET | Freq: Four times a day (QID) | ORAL | Status: DC | PRN

## 2023-01-02 MED ORDER — METOPROLOL SUCCINATE ER 25 MG PO TB24
25.0000 mg | ORAL_TABLET | Freq: Once | ORAL | Status: AC
  Administered 2023-01-02: 25 mg via ORAL
  Filled 2023-01-02: qty 1

## 2023-01-02 MED ORDER — NITROGLYCERIN 2 % TD OINT: 1.0000 [in_us] | TOPICAL_OINTMENT | Freq: Once | TRANSDERMAL | Status: DC

## 2023-01-02 MED ORDER — METOPROLOL SUCCINATE ER 25 MG PO TB24
25.0000 mg | ORAL_TABLET | Freq: Every morning | ORAL | Status: DC
  Administered 2023-01-03 – 2023-01-05 (×3): 25 mg via ORAL
  Filled 2023-01-02 (×3): qty 1

## 2023-01-02 MED ORDER — ONDANSETRON HCL 4 MG/2ML IJ SOLN
4.0000 mg | Freq: Four times a day (QID) | INTRAMUSCULAR | Status: AC | PRN
  Administered 2023-01-02 – 2023-01-03 (×2): 4 mg via INTRAVENOUS
  Filled 2023-01-02 (×2): qty 2

## 2023-01-02 MED ORDER — AMLODIPINE BESYLATE 10 MG PO TABS
10.0000 mg | ORAL_TABLET | Freq: Every morning | ORAL | Status: DC
  Administered 2023-01-03 – 2023-01-05 (×3): 10 mg via ORAL
  Filled 2023-01-02 (×3): qty 1

## 2023-01-02 MED ORDER — TICAGRELOR 90 MG PO TABS
90.0000 mg | ORAL_TABLET | Freq: Two times a day (BID) | ORAL | Status: DC
  Administered 2023-01-02 – 2023-01-05 (×6): 90 mg via ORAL
  Filled 2023-01-02 (×6): qty 1

## 2023-01-02 MED ORDER — LACTATED RINGERS IV BOLUS
500.0000 mL | Freq: Once | INTRAVENOUS | Status: AC
  Administered 2023-01-02: 500 mL via INTRAVENOUS

## 2023-01-02 MED ORDER — ACETAMINOPHEN 650 MG RE SUPP: 650.0000 mg | Freq: Four times a day (QID) | RECTAL | Status: DC | PRN

## 2023-01-02 MED ORDER — POLYVINYL ALCOHOL 1.4 % OP SOLN
1.0000 [drp] | OPHTHALMIC | Status: DC | PRN
  Administered 2023-01-02 – 2023-01-03 (×2): 1 [drp] via OPHTHALMIC
  Filled 2023-01-02: qty 15

## 2023-01-02 MED ORDER — ENOXAPARIN SODIUM 30 MG/0.3ML IJ SOSY
30.0000 mg | PREFILLED_SYRINGE | INTRAMUSCULAR | Status: DC
  Administered 2023-01-02 – 2023-01-04 (×3): 30 mg via SUBCUTANEOUS
  Filled 2023-01-02 (×3): qty 0.3

## 2023-01-02 NOTE — ED Provider Notes (Signed)
[facility] Provider Note   CSN: [id number] Arrival date & time: 01/02/23  [id number]     History {Add pertinent medical, surgical, social history, OB history to HPI:1} No chief complaint on file.   Amy Kerr is a 72 y.o. female.  HPI Patient presents for shortness of breath.  Medical history includes CAD, HLD, HTN.  She underwent left heart cath 12 days ago.  She had 95% stenosis of proximal OM1 and underwent stent placement.  Cardiologist is Dr. Terri Skains.  Following her stent placement, she was started on multiple new medications.  She has had poor appetite since that time.  Over the past 2 days, she has had fatigue and nausea.  Nausea progressed yesterday to the point that she did not want to eat.  She continued to drink fluids to stay hydrated.  At approximately 6 AM, patient woke up feeling short of breath.  When walking to the bathroom, she felt lightheaded.  When she laid back down, shortness of breath worsened.  This caused her to get up.  Sitting upright did mildly improve her shortness of breath but she continues to feel short of breath at rest.  She is also experiencing pain between her shoulder blades which is reminiscent of her symptoms prior to her stent procedure.  EMS noted hypertension with otherwise normal vital signs.  She has not yet taken her morning medications.    Home Medications Prior to Admission medications   Medication Sig Start Date End Date Taking? Authorizing Provider  amLODipine (NORVASC) 10 MG tablet Take 1 tablet (10 mg total) by mouth every morning. 12/22/22 03/22/23  Tolia, Sunit, DO  aspirin EC 81 MG tablet Take 1 tablet (81 mg total) by mouth daily. Swallow whole. 12/22/22 12/22/23  Tolia, Sunit, DO  Cholecalciferol (VITAMIN D-3) 25 MCG (1000 UT) CAPS Take 2,000 Units by mouth daily.    [provider]  clobetasol ointment (TEMOVATE) [id number] % Apply 1 Application topically as needed (rash).    [provider]  empagliflozin (JARDIANCE) 10 MG TABS tablet Take 1 tablet (10 mg total) by mouth daily before breakfast. 12/28/22 01/27/23  Tolia, Sunit, DO  fluticasone (FLONASE) 50 MCG/ACT nasal spray Place 1 spray into both nostrils as needed for allergies or rhinitis.    [provider]  metoprolol succinate (TOPROL XL) 25 MG 24 hr tablet Take 1 tablet (25 mg total) by mouth every morning. Hold if systolic blood pressure (top number) less than 100 mmHg or pulse less than 60 bpm. 12/22/22 12/22/23  Tolia, Sunit, DO  nitroGLYCERIN (NITROSTAT) 0.4 MG SL tablet Place 1 tablet (0.4 mg total) under the tongue every 5 (five) minutes as needed for chest pain. If you require more than two tablets five minutes apart go to the nearest ER via EMS. 12/21/22 01/20/23  Tolia, Sunit, DO  olmesartan (BENICAR) 40 MG tablet Take 1 tablet (40 mg total) by mouth daily at 10 pm. 12/28/22 03/28/23  Tolia, Sunit, DO  rosuvastatin (CRESTOR) 40 MG tablet Take 1 tablet (40 mg total) by mouth at bedtime. 12/22/22 12/22/23  Tolia, Sunit, DO  ticagrelor (BRILINTA) 90 MG TABS tablet Take 1 tablet (90 mg total) by mouth 2 (two) times daily. 12/22/22 12/22/23  Tolia, Sunit, DO      Allergies    Codeine, Morphine, and Zinc    Review of Systems   Review of Systems  Constitutional:  Positive for appetite change and fatigue.  Respiratory:  Positive for  shortness of breath.   Gastrointestinal:  Positive for nausea.  Musculoskeletal:  Positive for back pain.  All other systems reviewed and are negative.   Physical Exam Updated Vital Signs There were no vitals taken for this visit. Physical Exam Vitals and nursing note reviewed.  Constitutional:      General: She is not in acute distress.    Appearance: Normal appearance. She is well-developed. She is not ill-appearing, toxic-appearing or diaphoretic.  HENT:     Head: Normocephalic and atraumatic.     Right Ear: External ear normal.     Left Ear: External ear normal.      Nose: Nose normal.     Mouth/Throat:     Mouth: Mucous membranes are moist.  Eyes:     Extraocular Movements: Extraocular movements intact.     Conjunctiva/sclera: Conjunctivae normal.  Cardiovascular:     Rate and Rhythm: Normal rate and regular rhythm.     Heart sounds: No murmur heard. Pulmonary:     Effort: Pulmonary effort is normal. Tachypnea present. No accessory muscle usage, prolonged expiration or respiratory distress.     Breath sounds: Normal breath sounds. No wheezing, rhonchi or rales.  Chest:     Chest wall: No tenderness.  Abdominal:     General: There is no distension.     Palpations: Abdomen is soft.     Tenderness: There is no abdominal tenderness.  Musculoskeletal:        General: No swelling. Normal range of motion.     Cervical back: Normal range of motion and neck supple.     Right lower leg: No edema.     Left lower leg: No edema.  Skin:    General: Skin is warm and dry.     Capillary Refill: Capillary refill takes less than 2 seconds.     Coloration: Skin is not jaundiced or pale.  Neurological:     General: No focal deficit present.     Mental Status: She is alert and oriented to person, place, and time.     Cranial Nerves: No cranial nerve deficit.     Sensory: No sensory deficit.     Motor: No weakness.     Coordination: Coordination normal.  Psychiatric:        Mood and Affect: Mood normal.        Behavior: Behavior normal.        Thought Content: Thought content normal.        Judgment: Judgment normal.     ED Results / Procedures / Treatments   Labs (all labs ordered are listed, but only abnormal results are displayed) Labs Reviewed - No data to display  EKG None  Radiology No results found.  Procedures Procedures  {Document cardiac monitor, telemetry assessment procedure when appropriate:1}  Medications Ordered in ED Medications - No data to display  ED Course/ Medical Decision Making/ A&P   {   Click here for ABCD2, HEART  and other calculatorsREFRESH Note before signing :1}                          Medical Decision Making  This patient presents to the ED for concern of ***, this involves an extensive number of treatment options, and is a complaint that carries with it a high risk of complications and morbidity.  The differential diagnosis includes ***   Co morbidities that complicate the patient evaluation  ***   Additional history obtained:  Additional history obtained from *** External records from outside source obtained and reviewed including ***   Lab Tests:  I Ordered, and personally interpreted labs.  The pertinent results include:  ***   Imaging Studies ordered:  I ordered imaging studies including ***  I independently visualized and interpreted imaging which showed *** I agree with the radiologist interpretation   Cardiac Monitoring: / EKG:  The patient was maintained on a cardiac monitor.  I personally viewed and interpreted the cardiac monitored which showed an underlying rhythm of: ***   Consultations Obtained:  I requested consultation with the ***,  and discussed lab and imaging findings as well as pertinent plan - they recommend: ***   Problem List / ED Course / Critical interventions / Medication management  Patient presents for acute onset of shortness of breath.  This occurred this morning upon awakening at 6 AM.  She has no history of chronic lung disease.  She is postop day 11 from heart cath with stenting.  She has been adherent to her home medications but has not taken her doses this morning.  On exam, patient is overall well-appearing.  She is mildly tachypneic.  She is able to speak in complete sentences.  Lungs are clear to auscultation.  SpO2 is normal on room air.  EKG shows no ST segment elevations.  Laboratory workup was initiated.  Per chart review, she did undergo Po stenting echocardiogram.  LVEF at that time was normal.  Diastolic filling pattern was  indeterminate.  There was mild mitral regurgitation.  Blood pressure mildly elevated on arrival.  Home blood pressure medications were ordered.  Patient reports that she took 324 of ASA at home at approximately 7 AM.***. I ordered medication including ***  for ***  Reevaluation of the patient after these medicines showed that the patient {resolved/improved/worsened:23923::"improved"} I have reviewed the patients home medicines and have made adjustments as needed   Social Determinants of Health:  ***   Test / Admission - Considered:  ***   {Document critical care time when appropriate:1} {Document review of labs and clinical decision tools ie heart score, Chads2Vasc2 etc:1}  {Document your independent review of radiology images, and any outside records:1} {Document your discussion with family members, caretakers, and with consultants:1} {Document social determinants of health affecting pt's care:1} {Document your decision making why or why not admission, treatments were needed:1} Final Clinical Impression(s) / ED Diagnoses Final diagnoses:  None    Rx / DC Orders ED Discharge Orders     None

## 2023-01-02 NOTE — ED Triage Notes (Addendum)
From home. Woke with shortness of breath. Has not happened in the past for patient. Exertional shortness of breath while walking around house. Stent placement 2 weeks ago. Was having pain between shoulders prior to stent placement, and was having the same pain yesterday evening. Started on new med last week (Jardiance).

## 2023-01-02 NOTE — Consult Note (Signed)
CARDIOLOGY CONSULT NOTE  Patient ID: Amy Kerr MRN: [id number] DOB/AGE: 01/14/1951 72 y.o.  Admit date: 01/02/2023 Referring Physician  Dr Doren Custard Primary Physician:  [name], MD Reason for Consultation  SOB  Patient ID: Amy Kerr, female    DOB: 1951-09-21, 72 y.o.   MRN: [id number]  Chief Complaint  Patient presents with   Shortness of Breath   HPI:    Amy Kerr  is a 72 y.o. female whose past medical history and cardiovascular risk factors include: History of unstable angina 12/2022 status post PCI to OM1, hypertension, hyperlipidemia, GERD, vitamin D deficiency, atherosclerosis of the aorta.   Patient presented to the office on 12/21/2022 as a new consult for chest pain.  Her symptoms were very concerning for unstable angina.  She had a heart catheterization the same day was found to have obstructive CAD in the OM1.  She ruled in for unstable angina and was admitted to the hospital for overnight observation and later discharged home.  She presents to the ED this morning due to severe nausea, abdominal pain, and one episode of shortness of breath. She has not had any shortness of breath since her catheterization and her episode this morning only lasted a little while. It does not appear her shortness of breath is related to [place]. She did also start taking Jardiance since her catheterization and she has been very symptomatic on it. She feels sick to her stomach all the time and she does not have much of an appetite. She did not take the Jardiance this morning and we will continue holding it until her next office visit. Patient currently denies chest pain, shortness of breath, palpitations, diaphoresis, syncope.  Past Medical History:  Diagnosis Date   Hyperlipidemia    Hypertension    Past Surgical History:  Procedure Laterality Date   COLONOSCOPY N/A 10/23/2013   Procedure: COLONOSCOPY;  Surgeon: Garlan Fair, MD;  Location: [facility];  Service: Endoscopy;   Laterality: N/A;   CORONARY STENT INTERVENTION N/A 12/21/2022   Procedure: CORONARY STENT INTERVENTION;  Surgeon: Nigel Mormon, MD;  Location: [place] [facility];  Service: Cardiovascular;  Laterality: N/A;   GALLBLADDER SURGERY     LAPAROSCOPIC APPENDECTOMY N/A 03/22/2013   Procedure: APPENDECTOMY LAPAROSCOPIC;  Surgeon: Haywood Lasso, MD;  Location: [place];  Service: General;  Laterality: N/A;   LEFT HEART CATH AND CORONARY ANGIOGRAPHY N/A 12/21/2022   Procedure: LEFT HEART CATH AND CORONARY ANGIOGRAPHY;  Surgeon: Nigel Mormon, MD;  Location: [place] [facility];  Service: Cardiovascular;  Laterality: N/A;   Social History   Tobacco Use   Smoking status: Never   Smokeless tobacco: Never  Substance Use Topics   Alcohol use: No    Family History  Problem Relation Age of Onset   Cancer - Cervical Mother    Heart disease Father    Esophageal cancer Sister    Stomach cancer Brother    Heart disease Brother     Marital Status: Married  ROS  Review of Systems  Respiratory:  Positive for shortness of breath.   Gastrointestinal:  Positive for abdominal pain and nausea.   Objective      01/02/2023   11:30 AM 01/02/2023   11:00 AM 01/02/2023   10:00 AM  Vitals with BMI  Systolic [id number]  Diastolic 77 70 64  Pulse 75 71 71    Blood pressure (!) 145/77, pulse 75, temperature 97.9 F (36.6 C), temperature source Oral,  resp. rate 18, height 5\' 4"  (1.626 m), weight 81.2 kg, SpO2 100 %.    Physical Exam Vitals reviewed.  HENT:     Head: Normocephalic and atraumatic.  Cardiovascular:     Rate and Rhythm: Normal rate and regular rhythm.     Heart sounds: Normal heart sounds. No murmur heard. Pulmonary:     Effort: Pulmonary effort is normal.     Breath sounds: Normal breath sounds.  Abdominal:     General: Bowel sounds are normal.  Musculoskeletal:     Right lower leg: No edema.     Left lower leg: No edema.  Skin:    General: Skin is warm and dry.   Neurological:     Mental Status: She is alert.    Laboratory examination:   Recent Labs    12/21/22 1255 12/22/22 0141 01/02/23 0807  NA 140 137 138  K 3.7 3.7 4.1  CL 108 109 106  CO2 23 20* 20*  GLUCOSE 101* 126* 122*  BUN 12 9 24*  CREATININE 0.99 0.87 2.09*  CALCIUM 8.6* 8.2* 8.9  GFRNONAA >60 >60 25*   estimated creatinine clearance is 25.5 mL/min (A) (by C-G formula based on SCr of 2.09 mg/dL (H)).     Latest Ref Rng & Units 01/02/2023    8:07 AM 12/22/2022    1:41 AM 12/21/2022   12:55 PM  CMP  Glucose 70 - 99 mg/dL 122  126  101   BUN 8 - 23 mg/dL 24  9  12    Creatinine 0.44 - 1.00 mg/dL 2.09  0.87  0.99   Sodium 135 - 145 mmol/L 138  137  140   Potassium 3.5 - 5.1 mmol/L 4.1  3.7  3.7   Chloride 98 - 111 mmol/L 106  109  108   CO2 22 - 32 mmol/L 20  20  23    Calcium 8.9 - 10.3 mg/dL 8.9  8.2  8.6   Total Protein 6.5 - 8.1 g/dL 7.5     Total Bilirubin 0.3 - 1.2 mg/dL 1.4     Alkaline Phos 38 - 126 U/L 76     AST 15 - 41 U/L 34     ALT 0 - 44 U/L 53         Latest Ref Rng & Units 01/02/2023    8:07 AM 12/22/2022    1:41 AM 12/21/2022   12:55 PM  CBC  WBC 4.0 - 10.5 K/uL 7.2  8.7  7.6   Hemoglobin 12.0 - 15.0 g/dL 15.4  13.3  15.7   Hematocrit 36.0 - 46.0 % 43.7  38.6  46.8   Platelets 150 - 400 K/uL 271  251  303    Lipid Panel No results for input(s): "CHOL", "TRIG", "[place]", "VLDL", "HDL", "CHOLHDL", "LDLDIRECT" in the last 8760 hours.  HEMOGLOBIN A1C No results found for: "HGBA1C", "MPG" TSH No results for input(s): "TSH" in the last 8760 hours. BNP (last 3 results) Recent Labs    01/02/23 0807  BNP 19.9   Cardiac Panel (last 3 results) Recent Labs    01/02/23 0807 01/02/23 1034  CKTOTAL 109  --   TROPONINIHS 9 8     Medications and allergies   Allergies  Allergen Reactions   Codeine Nausea Only   Morphine Nausea Only   Zinc Nausea Only     No outpatient medications have been marked as taking for the 01/02/23 encounter  [facility] Encounter).    Scheduled Meds:  nitroGLYCERIN  1 inch Topical Once   Continuous Infusions: PRN Meds:.   No intake/output data recorded. No intake/output data recorded.  Net IO Since Admission: No IO data has been entered for this period [01/02/23 1202]   Radiology:   Imaging results have been reviewed and DG Chest [facility] 1 View  Result Date: 01/02/2023 CLINICAL DATA:  72 year old female with history of chest pain and dyspnea. EXAM: PORTABLE CHEST 1 VIEW COMPARISON:  No priors. FINDINGS: Lung volumes are normal. No consolidative airspace disease. No pleural effusions. No pneumothorax. No pulmonary nodule or mass noted. Pulmonary vasculature and the cardiomediastinal silhouette are within normal limits. Atherosclerosis in the thoracic aorta. IMPRESSION: 1.  No radiographic evidence of acute cardiopulmonary disease. 2. Aortic atherosclerosis. Electronically Signed   By: Vinnie Langton M.D.   On: 01/02/2023 08:25    Cardiac Studies:   Echocardiogram: 12/23/2022:  Normal LV systolic function with visual EF 55-60%. Left ventricle cavity  is normal in size. Normal left ventricular wall thickness. Normal global  wall motion. Indeterminate diastolic filling pattern. Calculated EF 60%.  Mild (Grade I) mitral regurgitation. Mild calcification of the mitral  valve annulus. Mild mitral valve leaflet thickening.  Structurally normal tricuspid valve with no regurgitation. No evidence of  pulmonary hypertension.  No prior available for comparison.    Stress Testing: No results found for this or any previous visit from the past 1095 days.   Heart Catheterization: 12/21/2022 LM: Normal LAD: Mid 30% disease         High diag 1 with mild luminal irregularities Lcx: Prox OM1 with focal 95% stenosis RCA: Mid tandem 30% and 40% stenoses   LVEDP normal LVEF normal   Presentation with unstable angina Successful percutaneous coronary intervention prox OM1        PTCA and stent  placement 3.0 X 16 mm Synergy drug-eluting stent        Post dilatation with 3.0X12 mm Perry balloon up to 20 atm Medical management for rest of the nonobstructive disease   EKG: 01/02/2023 Sinus rhythm. Low voltage in precordial leads. No evidence of ischemia.  12/28/2022: Sinus rhythm, 78 bpm, TWI in the high lateral, lateral leads concerning for anterolateral ischemia. Similar findings on prior EKG 12/21/2022.   Assessment & Recommendations:   Nausea/abdominal pain with AKI Suspect this is reaction to Jardiance She states she has felt very sick since starting Jardiance and she has no appetite Patient agreeable to holding Jardiance until she follows up in office.   Shortness of breath Resolved. She had one limited episode of SOB this morning. No SOB prior to this or any recurrence. Doubt this is related to [place], more likely anxiety induced. Trops are normal. CXR does not show heart failure.   CAD s/p DES to [place] chest pain free Continue DAPT with ASA and Brillinta Will hold Jardiance until follows up in office No further cardiac work-up warranted at this time Patient is stable for d/c home from cardiac standpoint.    Floydene Flock, DO, [facility] 01/02/2023, 12:02 PM Office: [phone]

## 2023-01-02 NOTE — H&P (Signed)
History and Physical   Amy Kerr [id number] DOB: 10/03/1951 DOA: 01/02/2023  PCP: [name], MD   Patient coming from: Home  Chief Complaint: Shortness of breath  HPI: Amy Kerr is a 72 y.o. female with medical history significant of CAD status post stenting, hyperlipidemia, obesity, hypertension presenting with shortness of breath.  Patient awoke with shortness of breath worse with exertion including just walking and also present at rest.  Patient's status post stenting about 2 weeks ago.  She was started on multiple new medications including Brilinta, Jardiance, high intensity statin.  She reports that since discharge she has had some decreased p.o. intake and she has had fatigue with nausea for the last couple days.  And for the last day she has had minimal intake of food but has tried to drink water.  As above she woke up around 6 AM with significant shortness of breath and also had some associated lightheadedness.  Vital signs in the ED significant for blood pressure in the 0000000 to [id number] systolic.  Lab workup included CMP with bicarb of 20, BUN 24, creatinine elevated to 2.09 from baseline 0.9, glucose 122, ALT 53, she denies fevers, chills, chest pain, abdominal pain, constipation, diarrhea.  ED Course: T. bili 1.4.  CBC showed hemoglobin 15.4.  Troponin negative x 2.  D-dimer negative.  BNP normal.  CK normal.  Respiratory panel for flu COVID RSV negative.  Magnesium mildly elevated at 2.7.  Urinalysis pending.  Chest x-ray showed no acute abnormality.  Patient received amlodipine, metoprolol, nitro, 500 cc IV fluids in the ED.  Cardiology team consulted and will see the patient.  Review of Systems: As per HPI otherwise all other systems reviewed and are negative.  Past Medical History:  Diagnosis Date   Hyperlipidemia    Hypertension     Past Surgical History:  Procedure Laterality Date   COLONOSCOPY N/A 10/23/2013   Procedure: COLONOSCOPY;  Surgeon: Garlan Fair, MD;  Location: [facility];  Service: Endoscopy;  Laterality: N/A;   CORONARY STENT INTERVENTION N/A 12/21/2022   Procedure: CORONARY STENT INTERVENTION;  Surgeon: Nigel Mormon, MD;  Location: [place] [facility];  Service: Cardiovascular;  Laterality: N/A;   GALLBLADDER SURGERY     LAPAROSCOPIC APPENDECTOMY N/A 03/22/2013   Procedure: APPENDECTOMY LAPAROSCOPIC;  Surgeon: Haywood Lasso, MD;  Location: [place];  Service: General;  Laterality: N/A;   LEFT HEART CATH AND CORONARY ANGIOGRAPHY N/A 12/21/2022   Procedure: LEFT HEART CATH AND CORONARY ANGIOGRAPHY;  Surgeon: Nigel Mormon, MD;  Location: [place] [facility];  Service: Cardiovascular;  Laterality: N/A;    Social History  reports that she has never smoked. She has never used smokeless tobacco. She reports that she does not drink alcohol and does not use drugs.  Allergies  Allergen Reactions   Codeine Nausea Only   Morphine Nausea Only   Zinc Nausea Only    Family History  Problem Relation Age of Onset   Cancer - Cervical Mother    Heart disease Father    Esophageal cancer Sister    Stomach cancer Brother    Heart disease Brother   Reviewed on admission  Prior to Admission medications   Medication Sig Start Date End Date Taking? Authorizing Provider  amLODipine (NORVASC) 10 MG tablet Take 1 tablet (10 mg total) by mouth every morning. 12/22/22 03/22/23  Tolia, Sunit, DO  aspirin EC 81 MG tablet Take 1 tablet (81 mg total) by mouth daily. Swallow whole.  12/22/22 12/22/23  Tolia, Sunit, DO  Cholecalciferol (VITAMIN D-3) 25 MCG (1000 UT) CAPS Take 2,000 Units by mouth daily.    [provider]  clobetasol ointment (TEMOVATE) [id number] % Apply 1 Application topically as needed (rash).    [provider]  empagliflozin (JARDIANCE) 10 MG TABS tablet Take 1 tablet (10 mg total) by mouth daily before breakfast. 12/28/22 01/27/23  Tolia, Sunit, DO  fluticasone (FLONASE) 50 MCG/ACT nasal spray Place 1  spray into both nostrils as needed for allergies or rhinitis.    [provider]  metoprolol succinate (TOPROL XL) 25 MG 24 hr tablet Take 1 tablet (25 mg total) by mouth every morning. Hold if systolic blood pressure (top number) less than 100 mmHg or pulse less than 60 bpm. 12/22/22 12/22/23  Tolia, Sunit, DO  nitroGLYCERIN (NITROSTAT) 0.4 MG SL tablet Place 1 tablet (0.4 mg total) under the tongue every 5 (five) minutes as needed for chest pain. If you require more than two tablets five minutes apart go to the nearest ER via EMS. 12/21/22 01/20/23  Tolia, Sunit, DO  olmesartan (BENICAR) 40 MG tablet Take 1 tablet (40 mg total) by mouth daily at 10 pm. 12/28/22 03/28/23  Tolia, Sunit, DO  rosuvastatin (CRESTOR) 40 MG tablet Take 1 tablet (40 mg total) by mouth at bedtime. 12/22/22 12/22/23  Tolia, Sunit, DO  ticagrelor (BRILINTA) 90 MG TABS tablet Take 1 tablet (90 mg total) by mouth 2 (two) times daily. 12/22/22 12/22/23  Rex Kras, DO    Physical Exam: Vitals:   01/02/23 1000 01/02/23 1100 01/02/23 1130 01/02/23 1150  BP: 111/64 120/70 (!) 145/77   Pulse: 71 71 75   Resp: 13 16 18    Temp:    97.9 F (36.6 C)  TempSrc:    Oral  SpO2: 98% 100% 100%   Weight:      Height:        Physical Exam Constitutional:      General: She is not in acute distress.    Appearance: Normal appearance.  HENT:     Head: Normocephalic and atraumatic.     Mouth/Throat:     Mouth: Mucous membranes are moist.     Pharynx: Oropharynx is clear.  Eyes:     Extraocular Movements: Extraocular movements intact.     Pupils: Pupils are equal, round, and reactive to light.  Cardiovascular:     Rate and Rhythm: Normal rate and regular rhythm.     Pulses: Normal pulses.     Heart sounds: Normal heart sounds.  Pulmonary:     Effort: Pulmonary effort is normal. No respiratory distress.     Breath sounds: Normal breath sounds.  Abdominal:     General: Bowel sounds are normal. There is no distension.      Palpations: Abdomen is soft.     Tenderness: There is no abdominal tenderness.  Musculoskeletal:        General: No swelling or deformity.  Skin:    General: Skin is warm and dry.  Neurological:     General: No focal deficit present.     Mental Status: Mental status is at baseline.    Labs on Admission: I have personally reviewed following labs and imaging studies  CBC: Recent Labs  Lab 01/02/23 0807  WBC 7.2  NEUTROABS 5.0  HGB 15.4*  HCT 43.7  MCV 87.1  PLT [id number]    Basic Metabolic Panel: Recent Labs  Lab 01/02/23 0807  NA 138  K 4.1  CL 106  CO2 20*  GLUCOSE 122*  BUN 24*  CREATININE 2.09*  CALCIUM 8.9  MG 2.7*    GFR: Estimated Creatinine Clearance: 25.5 mL/min (A) (by C-G formula based on SCr of 2.09 mg/dL (H)).  Liver Function Tests: Recent Labs  Lab 01/02/23 0807  AST 34  ALT 53*  ALKPHOS 76  BILITOT 1.4*  PROT 7.5  ALBUMIN 3.8    Urine analysis:    Component Value Date/Time   COLORURINE YELLOW 11/05/2015 2105   APPEARANCEUR CLEAR 11/05/2015 2105   LABSPEC 1.022 11/05/2015 2105   PHURINE 5.5 11/05/2015 2105   GLUCOSEU NEGATIVE 11/05/2015 2105   HGBUR NEGATIVE 11/05/2015 2105   BILIRUBINUR NEGATIVE 11/05/2015 2105   KETONESUR NEGATIVE 11/05/2015 2105   PROTEINUR NEGATIVE 11/05/2015 2105   UROBILINOGEN 0.2 03/22/2013 1430   NITRITE NEGATIVE 11/05/2015 2105   LEUKOCYTESUR SMALL (A) 11/05/2015 2105    Radiological Exams on Admission: DG Chest Port 1 View  Result Date: 01/02/2023 CLINICAL DATA:  72 year old female with history of chest pain and dyspnea. EXAM: PORTABLE CHEST 1 VIEW COMPARISON:  No priors. FINDINGS: Lung volumes are normal. No consolidative airspace disease. No pleural effusions. No pneumothorax. No pulmonary nodule or mass noted. Pulmonary vasculature and the cardiomediastinal silhouette are within normal limits. Atherosclerosis in the thoracic aorta. IMPRESSION: 1.  No radiographic evidence of acute cardiopulmonary disease.  2. Aortic atherosclerosis. Electronically Signed   By: Vinnie Langton M.D.   On: 01/02/2023 08:25    EKG: Independently reviewed.  Sinus rhythm at 80 bpm.  Nonspecific T wave flattening.  Low voltage multiple leads.  Assessment/Plan Principal Problem:   AKI (acute kidney injury) ([place]) Active Problems:   CAD (coronary artery disease)   S/P angioplasty with stent   Benign hypertension   Mixed hyperlipidemia   Class 1 obesity due to excess calories with serious comorbidity and body mass index (BMI) of 32.0 to 32.9 in adult   AKI > Presenting with shortness of breath and some fatigue as well as lightheadedness in the setting of recent catheterization and medication changes. > Was started on Jardiance, Brilinta, aspirin, had statin increased to high intensity rosuvastatin. > Has had some decreased p.o. intake in the last couple days but has been trying to drink water to stay hydrated. > Likely some degree of dehydration but possible medication effect due to multiple new medications. Jardiance could be contributing to nausea. > Received 500 cc IV fluids in the ED.  Mildly elevated magnesium at 2.7. - Monitor on telemetry given concurrent dyspnea and recent cardiac history. - Continue IV fluids overnight - Hold Jardiance, olmesartan, rosuvastatin for now - Trend renal function and electrolytes  Dyspnea > Patient presenting with dyspnea.  Normal troponins, normal D-dimer, normal BNP.  Has had some decreased p.o. intake and has AKI as above.  Respiratory panel for flu COVID RSV negative.  Chest x-ray showed nuclear malady. > Some suspicion for possible side effect from Brilinta though dyspnea is a side effect typically would occur within the first day or so and improve after a week of Brilinta initiation. - Appreciate cardiology recommendations - Will hold off on Brilinta for today, pending cardiology evaluation - Continue to monitor on telemetry   HLD CAD > Status post stenting about 12  days ago. - Holding olmesartan, rosuvastatin, Jardiance as above. - Continue home aspirin - Continuation of Brilinta based on cardiology recommendations as above - Continue home metoprolol  Hypertension - Continue home amlodipine and metoprolol - Holding home olmesartan as  above  Obesity - Noted  DVT prophylaxis: Lovenox Code Status:   Full Family Communication:  Updated at bedside  Disposition Plan:   Patient is from:  Home  Anticipated DC to:  Home  Anticipated DC date:  1 to 2 days  Anticipated DC barriers: None  Consults called:  Cardiology Admission status:  Observation, telemetry  Severity of Illness: The appropriate patient status for this patient is OBSERVATION. Observation status is judged to be reasonable and necessary in order to provide the required intensity of service to ensure the patient's safety. The patient's presenting symptoms, physical exam findings, and initial radiographic and laboratory data in the context of their medical condition is felt to place them at decreased risk for further clinical deterioration. Furthermore, it is anticipated that the patient will be medically stable for discharge from the hospital within 2 midnights of admission.    Marcelyn Bruins MD [facility]  How to contact the [facility] Attending or Consulting provider [place] or covering provider during after hours [place], for this patient?   Check the care team in [facility] and look for a) attending/consulting [facility] provider listed and b) the [facility] team listed Log into [email or web address] and use [place]'s universal password to access. If you do not have the password, please contact the hospital operator. Locate the [facility] provider you are looking for under [facility] and page to a number that you can be directly reached. If you still have difficulty reaching the provider, please page the [facility] (Director on Call) for the Hospitalists listed on amion for assistance.  01/02/2023, 12:55 PM

## 2023-01-02 NOTE — Progress Notes (Signed)
Patient transferred from ED at 1358hrs. Oriented to unit and plan of care for shift. Patient verbalized understanding.  Husband at bedside.  Patient denies pain/SOB on arrival.

## 2023-01-02 NOTE — ED Notes (Signed)
ED TO INPATIENT HANDOFF REPORT  ED Nurse Name and Phone #: Richardson Landry [phone]  S Name/Age/Gender Amy Kerr 72 y.o. female Room/Bed: 029C/029C  Code Status   Code Status: Full Code  Home/SNF/Other Home Patient oriented to: self, place, time, and situation Is this baseline? Yes   Triage Complete: Triage complete  Chief Complaint AKI (acute kidney injury) [N17.9]  Triage Note From home. Woke with shortness of breath. Has not happened in the past for patient. Exertional shortness of breath while walking around house. Stent placement 2 weeks ago. Was having pain between shoulders prior to stent placement, and was having the same pain yesterday evening. Started on new med last week (Jardiance).    Allergies Allergies  Allergen Reactions   Codeine Nausea Only   Morphine Nausea Only   Zinc Nausea Only    Level of Care/Admitting Diagnosis ED Disposition     ED Disposition  Admit   Condition  --   Comment  Hospital Area: [facility] [[id number]]  Level of Care: Telemetry Medical [104]  May place patient in observation at [facility] or [place] if equivalent level of care is available:: No  Covid Evaluation: Asymptomatic - no recent exposure (last 10 days) testing not required  Diagnosis: AKI (acute kidney injury) [id number]  Admitting Physician: Marcelyn Bruins [id number]  Attending Physician: Marcelyn Bruins [id number]          B Medical/Surgery History Past Medical History:  Diagnosis Date   Hyperlipidemia    Hypertension    Past Surgical History:  Procedure Laterality Date   COLONOSCOPY N/A 10/23/2013   Procedure: COLONOSCOPY;  Surgeon: Garlan Fair, MD;  Location: [facility];  Service: Endoscopy;  Laterality: N/A;   CORONARY STENT INTERVENTION N/A 12/21/2022   Procedure: CORONARY STENT INTERVENTION;  Surgeon: Nigel Mormon, MD;  Location: [facility];  Service: Cardiovascular;  Laterality: N/A;   GALLBLADDER SURGERY      LAPAROSCOPIC APPENDECTOMY N/A 03/22/2013   Procedure: APPENDECTOMY LAPAROSCOPIC;  Surgeon: Haywood Lasso, MD;  Location: [facility];  Service: General;  Laterality: N/A;   LEFT HEART CATH AND CORONARY ANGIOGRAPHY N/A 12/21/2022   Procedure: LEFT HEART CATH AND CORONARY ANGIOGRAPHY;  Surgeon: Nigel Mormon, MD;  Location: [facility];  Service: Cardiovascular;  Laterality: N/A;     A IV Location/Drains/Wounds Patient Lines/Drains/Airways Status     Active Line/Drains/Airways     Name Placement date Placement time Site Days   Peripheral IV 01/02/23 20 G Right Antecubital 01/02/23  0811  Antecubital  less than 1   Incision - 3 Ports Abdomen 1: Umbilicus 2: Right;Upper 3: Left;Lower 03/22/13  2315  -- 3573            Intake/Output Last 24 hours No intake or output data in the 24 hours ending 01/02/23 1250  Labs/Imaging Results for orders placed or performed during the hospital encounter of 01/02/23 (from the past 48 hour(s))  Resp panel by RT-PCR (RSV, Flu A&B, Covid) Anterior Nasal Swab     Status: None   Collection Time: 01/02/23  7:52 AM   Specimen: Anterior Nasal Swab  Result Value Ref Range   SARS Coronavirus 2 by RT PCR NEGATIVE NEGATIVE   Influenza A by PCR NEGATIVE NEGATIVE   Influenza B by PCR NEGATIVE NEGATIVE    Comment: (NOTE) The Xpert Xpress SARS-CoV-2/FLU/RSV plus assay is intended as an aid in the diagnosis of influenza from Nasopharyngeal swab specimens and should not be  used as a sole basis for treatment. Nasal washings and aspirates are unacceptable for Xpert Xpress SARS-CoV-2/FLU/RSV testing.  Fact Sheet for Patients: EntrepreneurPulse.com.au  Fact Sheet for Healthcare Providers: IncredibleEmployment.be  This test is not yet approved or cleared by the [place] FDA and has been authorized for detection and/or diagnosis of SARS-CoV-2 by FDA under an Emergency Use Authorization (EUA). This EUA will  remain in effect (meaning this test can be used) for the duration of the COVID-19 declaration under Section 564(b)(1) of the Act, 21 U.S.C. section 360bbb-3(b)(1), unless the authorization is terminated or revoked.     Resp Syncytial Virus by PCR NEGATIVE NEGATIVE    Comment: (NOTE) Fact Sheet for Patients: EntrepreneurPulse.com.au  Fact Sheet for Healthcare Providers: IncredibleEmployment.be  This test is not yet approved or cleared by the [place] FDA and has been authorized for detection and/or diagnosis of SARS-CoV-2 by FDA under an Emergency Use Authorization (EUA). This EUA will remain in effect (meaning this test can be used) for the duration of the COVID-19 declaration under Section 564(b)(1) of the Act, 21 U.S.C. section 360bbb-3(b)(1), unless the authorization is terminated or revoked.  Performed at [facility], [place] [address] [place], [place] [address]   Comprehensive metabolic panel     Status: Abnormal   Collection Time: 01/02/23  8:07 AM  Result Value Ref Range   Sodium 138 135 - 145 mmol/L   Potassium 4.1 3.5 - 5.1 mmol/L   Chloride 106 98 - 111 mmol/L   CO2 20 (L) 22 - 32 mmol/L   Glucose, Bld 122 (H) 70 - 99 mg/dL    Comment: Glucose reference range applies only to samples taken after fasting for at least 8 hours.   BUN 24 (H) 8 - 23 mg/dL   Creatinine, Ser 2.09 (H) 0.44 - 1.00 mg/dL   Calcium 8.9 8.9 - 10.3 mg/dL   Total Protein 7.5 6.5 - 8.1 g/dL   Albumin 3.8 3.5 - 5.0 g/dL   AST 34 15 - 41 U/L   ALT 53 (H) 0 - 44 U/L   Alkaline Phosphatase 76 38 - 126 U/L   Total Bilirubin 1.4 (H) 0.3 - 1.2 mg/dL   GFR, Estimated 25 (L) >60 mL/min    Comment: (NOTE) Calculated using the CKD-EPI Creatinine Equation (2021)    Anion gap 12 5 - 15    Comment: Performed at [facility] [address] [place], [place] [address]  CBC with Differential/Platelet     Status: Abnormal   Collection Time: 01/02/23  8:07  AM  Result Value Ref Range   WBC 7.2 4.0 - 10.5 K/uL   RBC 5.02 3.87 - 5.11 MIL/uL   Hemoglobin 15.4 (H) 12.0 - 15.0 g/dL   HCT 43.7 36.0 - 46.0 %   MCV 87.1 80.0 - 100.0 fL   MCH 30.7 26.0 - 34.0 pg   MCHC 35.2 30.0 - 36.0 g/dL   RDW 12.6 11.5 - 15.5 %   Platelets 271 150 - 400 K/uL   nRBC 0.0 0.0 - 0.2 %   Neutrophils Relative % 69 %   Neutro Abs 5.0 1.7 - 7.7 K/uL   Lymphocytes Relative 17 %   Lymphs Abs 1.2 0.7 - 4.0 K/uL   Monocytes Relative 10 %   Monocytes Absolute 0.7 0.1 - 1.0 K/uL   Eosinophils Relative 3 %   Eosinophils Absolute 0.2 0.0 - 0.5 K/uL   Basophils Relative 1 %   Basophils Absolute 0.0 0.0 -  0.1 K/uL   Immature Granulocytes 0 %   Abs Immature Granulocytes 0.01 0.00 - 0.07 K/uL    Comment: Performed at [facility], [place] [address] [place], [place] [address]  D-dimer, quantitative     Status: None   Collection Time: 01/02/23  8:07 AM  Result Value Ref Range   D-Dimer, Kerr 0.27 0.00 - 0.50 ug/mL-FEU    Comment: (NOTE) At the manufacturer cut-off value of 0.5 g/mL FEU, this assay has a negative predictive value of 95-100%.This assay is intended for use in conjunction with a clinical pretest probability (PTP) assessment model to exclude pulmonary embolism (PE) and deep venous thrombosis (DVT) in outpatients suspected of PE or DVT. Results should be correlated with clinical presentation. Performed at [facility], [place] [address], [place], [place] [address]   Brain natriuretic peptide     Status: None   Collection Time: 01/02/23  8:07 AM  Result Value Ref Range   B Natriuretic Peptide 19.9 0.0 - 100.0 pg/mL    Comment: Performed at [facility] [address] [place], [place] [address]  Magnesium     Status: Abnormal   Collection Time: 01/02/23  8:07 AM  Result Value Ref Range   Magnesium 2.7 (H) 1.7 - 2.4 mg/dL    Comment: Performed at [facility] [address], [place], [place] [address]  CK     Status: None    Collection Time: 01/02/23  8:07 AM  Result Value Ref Range   Total CK 109 38 - 234 U/L    Comment: Performed at [facility], [place] [address], [place], [place] [address]  Troponin I (High Sensitivity)     Status: None   Collection Time: 01/02/23  8:07 AM  Result Value Ref Range   Troponin I (High Sensitivity) 9 <18 ng/L    Comment: (NOTE) Elevated high sensitivity troponin I (hsTnI) values and significant  changes across serial measurements may suggest ACS but many other  chronic and acute conditions are known to elevate hsTnI results.  Refer to the "Links" section for chest pain algorithms and additional  guidance. Performed at [facility], [place] [address] [place], [place] [address]   Troponin I (High Sensitivity)     Status: None   Collection Time: 01/02/23 10:34 AM  Result Value Ref Range   Troponin I (High Sensitivity) 8 <18 ng/L    Comment: (NOTE) Elevated high sensitivity troponin I (hsTnI) values and significant  changes across serial measurements may suggest ACS but many other  chronic and acute conditions are known to elevate hsTnI results.  Refer to the "Links" section for chest pain algorithms and additional  guidance. Performed at [facility], [place] [address], [place], [place] [address]    DG Chest Port 1 View  Result Date: 01/02/2023 CLINICAL DATA:  72 year old female with history of chest pain and dyspnea. EXAM: PORTABLE CHEST 1 VIEW COMPARISON:  No priors. FINDINGS: Lung volumes are normal. No consolidative airspace disease. No pleural effusions. No pneumothorax. No pulmonary nodule or mass noted. Pulmonary vasculature and the cardiomediastinal silhouette are within normal limits. Atherosclerosis in the thoracic aorta. IMPRESSION: 1.  No radiographic evidence of acute cardiopulmonary disease. 2. Aortic atherosclerosis. Electronically Signed   By: Vinnie Langton M.D.   On: 01/02/2023 08:25    Pending Labs Unresulted Labs (From admission, onward)      Start     Ordered   01/09/23 0500  Creatinine, serum  (enoxaparin (LOVENOX)    CrCl < 30 ml/min)  Once,   R       Comments: while on enoxaparin therapy.    01/02/23 1237   01/03/23 0500  Comprehensive metabolic panel  Tomorrow morning,   R        01/02/23 1237   01/03/23 0500  CBC  Tomorrow morning,   R        01/02/23 1237   01/02/23 1017  Urinalysis, Routine w reflex microscopic -Urine, Clean Catch  Once,   URGENT       Question:  Specimen Source  Answer:  Urine, Clean Catch   01/02/23 1016            Vitals/Pain Today's Vitals   01/02/23 1000 01/02/23 1100 01/02/23 1130 01/02/23 1150  BP: 111/64 120/70 (!) 145/77   Pulse: 71 71 75   Resp: 13 16 18    Temp:    97.9 F (36.6 C)  TempSrc:    Oral  SpO2: 98% 100% 100%   Weight:      Height:      PainSc:        Isolation Precautions No active isolations  Medications Medications  nitroGLYCERIN (NITROGLYN) 2 % ointment 1 inch (1 inch Topical Not Given 01/02/23 0840)  amLODipine (NORVASC) tablet 10 mg (has no administration in time range)  metoprolol succinate (TOPROL-XL) 24 hr tablet 25 mg (has no administration in time range)  enoxaparin (LOVENOX) injection 30 mg (has no administration in time range)  sodium chloride flush (NS) 0.9 % injection 3 mL (has no administration in time range)  acetaminophen (TYLENOL) tablet 650 mg (has no administration in time range)    Or  acetaminophen (TYLENOL) suppository 650 mg (has no administration in time range)  polyethylene glycol (MIRALAX / GLYCOLAX) packet 17 g (has no administration in time range)  metoprolol succinate (TOPROL-XL) 24 hr tablet 25 mg (25 mg Oral Given 01/02/23 0838)  amLODipine (NORVASC) tablet 5 mg (5 mg Oral Given 01/02/23 0838)  lactated ringers bolus 500 mL (0 mLs Intravenous Stopped 01/02/23 1210)    Mobility walks     Focused Assessments Cardiac Assessment Handoff:    Lab Results  Component Value Date   CKTOTAL 109 01/02/2023   Lab Results   Component Value Date   DDIMER 0.27 01/02/2023   Does the Patient currently have chest pain? No    R Recommendations: See Admitting Provider Note  Report given to:   Additional Notes:

## 2023-01-03 DIAGNOSIS — Z91048 Other nonmedicinal substance allergy status: Secondary | ICD-10-CM | POA: Diagnosis not present

## 2023-01-03 DIAGNOSIS — I10 Essential (primary) hypertension: Secondary | ICD-10-CM | POA: Diagnosis not present

## 2023-01-03 DIAGNOSIS — E860 Dehydration: Secondary | ICD-10-CM | POA: Diagnosis present

## 2023-01-03 DIAGNOSIS — Z7982 Long term (current) use of aspirin: Secondary | ICD-10-CM | POA: Diagnosis not present

## 2023-01-03 DIAGNOSIS — R110 Nausea: Secondary | ICD-10-CM | POA: Diagnosis present

## 2023-01-03 DIAGNOSIS — I700 Atherosclerosis of aorta: Secondary | ICD-10-CM | POA: Diagnosis present

## 2023-01-03 DIAGNOSIS — T50995A Adverse effect of other drugs, medicaments and biological substances, initial encounter: Secondary | ICD-10-CM | POA: Diagnosis present

## 2023-01-03 DIAGNOSIS — R630 Anorexia: Secondary | ICD-10-CM | POA: Diagnosis present

## 2023-01-03 DIAGNOSIS — R0602 Shortness of breath: Secondary | ICD-10-CM | POA: Diagnosis present

## 2023-01-03 DIAGNOSIS — E6609 Other obesity due to excess calories: Secondary | ICD-10-CM | POA: Diagnosis not present

## 2023-01-03 DIAGNOSIS — R0600 Dyspnea, unspecified: Secondary | ICD-10-CM | POA: Diagnosis not present

## 2023-01-03 DIAGNOSIS — Z885 Allergy status to narcotic agent status: Secondary | ICD-10-CM | POA: Diagnosis not present

## 2023-01-03 DIAGNOSIS — E782 Mixed hyperlipidemia: Secondary | ICD-10-CM | POA: Diagnosis not present

## 2023-01-03 DIAGNOSIS — Z8249 Family history of ischemic heart disease and other diseases of the circulatory system: Secondary | ICD-10-CM | POA: Diagnosis not present

## 2023-01-03 DIAGNOSIS — Z6832 Body mass index (BMI) 32.0-32.9, adult: Secondary | ICD-10-CM | POA: Diagnosis not present

## 2023-01-03 DIAGNOSIS — F419 Anxiety disorder, unspecified: Secondary | ICD-10-CM | POA: Diagnosis present

## 2023-01-03 DIAGNOSIS — M549 Dorsalgia, unspecified: Secondary | ICD-10-CM | POA: Diagnosis not present

## 2023-01-03 DIAGNOSIS — Z95820 Peripheral vascular angioplasty status with implants and grafts: Secondary | ICD-10-CM | POA: Diagnosis not present

## 2023-01-03 DIAGNOSIS — Z7984 Long term (current) use of oral hypoglycemic drugs: Secondary | ICD-10-CM | POA: Diagnosis not present

## 2023-01-03 DIAGNOSIS — Z79899 Other long term (current) drug therapy: Secondary | ICD-10-CM | POA: Diagnosis not present

## 2023-01-03 DIAGNOSIS — K219 Gastro-esophageal reflux disease without esophagitis: Secondary | ICD-10-CM | POA: Diagnosis present

## 2023-01-03 DIAGNOSIS — Z7902 Long term (current) use of antithrombotics/antiplatelets: Secondary | ICD-10-CM | POA: Diagnosis not present

## 2023-01-03 DIAGNOSIS — Z1152 Encounter for screening for COVID-19: Secondary | ICD-10-CM | POA: Diagnosis not present

## 2023-01-03 DIAGNOSIS — N179 Acute kidney failure, unspecified: Secondary | ICD-10-CM | POA: Diagnosis present

## 2023-01-03 DIAGNOSIS — E559 Vitamin D deficiency, unspecified: Secondary | ICD-10-CM | POA: Diagnosis present

## 2023-01-03 DIAGNOSIS — Z955 Presence of coronary angioplasty implant and graft: Secondary | ICD-10-CM | POA: Diagnosis not present

## 2023-01-03 DIAGNOSIS — I25110 Atherosclerotic heart disease of native coronary artery with unstable angina pectoris: Secondary | ICD-10-CM | POA: Diagnosis present

## 2023-01-03 LAB — COMPREHENSIVE METABOLIC PANEL
ALT: 43 U/L (ref 0–44)
AST: 28 U/L (ref 15–41)
Albumin: 3 g/dL — ABNORMAL LOW (ref 3.5–5.0)
Alkaline Phosphatase: 62 U/L (ref 38–126)
Anion gap: 8 (ref 5–15)
BUN: 22 mg/dL (ref 8–23)
CO2: 20 mmol/L — ABNORMAL LOW (ref 22–32)
Calcium: 7.9 mg/dL — ABNORMAL LOW (ref 8.9–10.3)
Chloride: 107 mmol/L (ref 98–111)
Creatinine, Ser: 1.82 mg/dL — ABNORMAL HIGH (ref 0.44–1.00)
GFR, Estimated: 29 mL/min — ABNORMAL LOW (ref >60)
Glucose, Bld: 92 mg/dL (ref 70–99)
Potassium: 4 mmol/L (ref 3.5–5.1)
Sodium: 135 mmol/L (ref 135–145)
Total Bilirubin: 1.1 mg/dL (ref 0.3–1.2)
Total Protein: 6 g/dL — ABNORMAL LOW (ref 6.5–8.1)

## 2023-01-03 LAB — CBC
HCT: 37.7 % (ref 36.0–46.0)
Hemoglobin: 12.7 g/dL (ref 12.0–15.0)
MCH: 30 pg (ref 26.0–34.0)
MCHC: 33.7 g/dL (ref 30.0–36.0)
MCV: 89.1 fL (ref 80.0–100.0)
Platelets: 210 10*3/uL (ref 150–400)
RBC: 4.23 MIL/uL (ref 3.87–5.11)
RDW: 12.5 % (ref 11.5–15.5)
WBC: 6.2 10*3/uL (ref 4.0–10.5)
nRBC: 0 % (ref 0.0–0.2)

## 2023-01-03 MED ORDER — ALUM & MAG HYDROXIDE-SIMETH 200-200-20 MG/5ML PO SUSP: 30.0000 mL | ORAL | Status: DC | PRN

## 2023-01-03 MED ORDER — ONDANSETRON HCL 4 MG/2ML IJ SOLN: 4.0000 mg | Freq: Four times a day (QID) | INTRAMUSCULAR | Status: DC | PRN

## 2023-01-03 MED ORDER — ROSUVASTATIN CALCIUM 20 MG PO TABS
40.0000 mg | ORAL_TABLET | Freq: Every day | ORAL | Status: DC
  Administered 2023-01-03: 40 mg via ORAL
  Filled 2023-01-03: qty 2

## 2023-01-03 MED ORDER — ASPIRIN 81 MG PO TBEC
81.0000 mg | DELAYED_RELEASE_TABLET | Freq: Every day | ORAL | Status: DC
  Administered 2023-01-03 – 2023-01-05 (×3): 81 mg via ORAL
  Filled 2023-01-03 (×3): qty 1

## 2023-01-03 MED ORDER — LACTATED RINGERS IV SOLN: INTRAVENOUS | Status: DC

## 2023-01-03 MED ORDER — ROSUVASTATIN CALCIUM 20 MG PO TABS: 20.0000 mg | ORAL_TABLET | Freq: Every day | ORAL | Status: DC

## 2023-01-03 NOTE — Hospital Course (Addendum)
72 year old woman status post cardiac stent but 2 weeks prior, started on Brilinta, Jardiance at that time.  Developed nausea with Jardiance.  Poor oral intake including liquid.  Presented with shortness of breath, nausea, abdominal pain.  Cardiac workup was reassuring, seen by cardiology, recommended to continue aspirin and Brilinta.  Jardiance discontinued.  Nausea and abdominal pain thought to be secondary to Jardiance.  Main issue at this point is AKI.

## 2023-01-03 NOTE — Progress Notes (Signed)
  Progress Note   Patient: Amy Kerr [id number] DOB: 07-20-1951 DOA: 01/02/2023     0 DOS: the patient was seen and examined on 01/03/2023   Brief hospital course: 72 year old woman status post cardiac stent but 2 weeks prior, started on Brilinta, Jardiance at that time.  Developed nausea with Jardiance.  Poor oral intake including liquid.  Presented with shortness of breath, nausea, abdominal pain.  Cardiac workup was reassuring, seen by cardiology, recommended to continue aspirin and Brilinta.  Jardiance discontinued.  Nausea and abdominal pain thought to be secondary to Jardiance.  Main issue at this point is AKI.  Assessment and Plan: AKI Secondary to poor oral intake secondary to nausea from Jardiance.  Probably complicated by Benicar. Baseline creatinine 0.87, was 2.09 on admission.  Only slightly better today at 1.2. Increase IV fluids and check BMP in AM.  Assess for diet tolerance.   Dyspnea Self-limited.  Thought to be anxiety induced per cardiology. Normal troponins, normal D-dimer, normal BNP.    Respiratory panel for flu COVID RSV negative.  Chest x-ray showed no acute abnormality. No further evaluation suggested.   Hyperlipidemia CAD s/p DES to OM1  Continue aspirin, Brilinta and per cardiology.  Continue beta-blocker Resume statin.  Hold olmesartan.   Essential hypertension Stable.  Continue home amlodipine and metoprolol Holding home olmesartan   Body mass index is 30.73 kg/m.       Subjective:  Some nausea No vomiting Has lost about 20 lbs in 4 weeks from poor appetite Nausea only began after Jardiance began last week Hasn't seen PCP yet about this  Physical Exam: Vitals:   01/03/23 0002 01/03/23 0342 01/03/23 0944 01/03/23 1241  BP: 111/60 111/61 121/65 112/60  Pulse: 70 69 65 75  Resp: 16 18  18   Temp: 97.7 F (36.5 C) 97.7 F (36.5 C)  98 F (36.7 C)  TempSrc: Oral Oral  Oral  SpO2:    94%  Weight:      Height:       Physical  Exam Vitals reviewed.  Constitutional:      General: She is not in acute distress.    Appearance: She is not ill-appearing or toxic-appearing.  Cardiovascular:     Rate and Rhythm: Normal rate and regular rhythm.     Heart sounds: No murmur heard. Pulmonary:     Effort: Pulmonary effort is normal. No respiratory distress.     Breath sounds: No wheezing, rhonchi or rales.  Abdominal:     General: There is no distension.     Palpations: Abdomen is soft.     Tenderness: There is no abdominal tenderness. There is no guarding.  Neurological:     Mental Status: She is alert.  Psychiatric:        Mood and Affect: Mood normal.        Behavior: Behavior normal.     Data Reviewed: Creatinine 2.09 > 1.82 CBC noted Troponins and ddimer negative CXR NAD  Family Communication: husband at bedside  Disposition: Status is: Observation   Planned Discharge Destination: Home    Time spent: 20 minutes  Author: Murray Hodgkins, MD 01/03/2023 4:13 PM  For on call review [email or web address].

## 2023-01-04 LAB — BASIC METABOLIC PANEL
Anion gap: 10 (ref 5–15)
BUN: 14 mg/dL (ref 8–23)
CO2: 21 mmol/L — ABNORMAL LOW (ref 22–32)
Calcium: 8.6 mg/dL — ABNORMAL LOW (ref 8.9–10.3)
Chloride: 110 mmol/L (ref 98–111)
Creatinine, Ser: 1.57 mg/dL — ABNORMAL HIGH (ref 0.44–1.00)
GFR, Estimated: 35 mL/min — ABNORMAL LOW (ref >60)
Glucose, Bld: 98 mg/dL (ref 70–99)
Potassium: 3.8 mmol/L (ref 3.5–5.1)
Sodium: 141 mmol/L (ref 135–145)

## 2023-01-04 MED ORDER — POLYETHYLENE GLYCOL 3350 17 G PO PACK
17.0000 g | PACK | Freq: Two times a day (BID) | ORAL | Status: DC
  Administered 2023-01-04: 17 g via ORAL
  Filled 2023-01-04 (×2): qty 1

## 2023-01-04 NOTE — Progress Notes (Addendum)
01/04/23 4:30 Pt c/o of 7/10 cramping pain under L shoulder blade, as well as generalized asthenia & myalgia. Pt states this is exactly how she felt when she was admitted on 01/02/23. Charleston Ropes, RPH & Argie Ramming, MD notified.   Update 01/04/23 05:45 Crestor discontinued by Sidney Ace, MD & pharmacy monitoring order regarding intolerance placed by [place], [facility]

## 2023-01-04 NOTE — Progress Notes (Signed)
  Progress Note   Patient: Amy Kerr [id number] DOB: December 09, 1950 DOA: 01/02/2023     1 DOS: the patient was seen and examined on 01/04/2023   Brief hospital course: 72 year old woman status post cardiac stent but 2 weeks prior, started on Brilinta, Jardiance at that time.  Developed nausea with Jardiance.  Poor oral intake including liquid.  Presented with shortness of breath, nausea, abdominal pain.  Cardiac workup was reassuring, seen by cardiology, recommended to continue aspirin and Brilinta.  Jardiance discontinued.  Nausea and abdominal pain thought to be secondary to Jardiance.  Main issue at this point is AKI.  Assessment and Plan: AKI Secondary to poor oral intake secondary to nausea from Jardiance.  Probably complicated by Benicar. Baseline creatinine 0.87, was 2.09 on admission.  2.09 > 1.82 > 1.57 today; feels poorly today. Monitor for diet intake. If better this afternoon may be able to go home, if not, possibly tomorrow.   Dyspnea Self-limited.  Thought to be anxiety induced per cardiology. Normal troponins, normal D-dimer, normal BNP.    Respiratory panel for flu COVID RSV negative.  Chest x-ray showed no acute abnormality. No further evaluation suggested.   Hyperlipidemia CAD s/p DES to OM1  Continue aspirin, Brilinta and per cardiology.  Continue beta-blocker Hold statin given muscle pains. Patient does not think she can tolerate this medication.  Hold olmesartan.   Essential hypertension Stable.  Continue home amlodipine and metoprolol Holding home olmesartan   Body mass index is 30.73 kg/m.     Subjective:  Had a rough night with shoulder and back pain after Crestor. Thinks she cannot tolerate this medication. Ate some breakfast but doesn't feel well right now.  Physical Exam: Vitals:   01/03/23 1651 01/03/23 2051 01/04/23 0431 01/04/23 0853  BP: 119/70 113/65  122/64  Pulse: 70 68  78  Resp: 18 18 18 18   Temp: 97.9 F (36.6 C) 97.9 F (36.6 C) 97.7  F (36.5 C) 98.1 F (36.7 C)  TempSrc: Oral Oral Oral Oral  SpO2: 97% 95% 97%   Weight:      Height:       Physical Exam Vitals reviewed.  Constitutional:      General: She is not in acute distress.    Appearance: She is not ill-appearing or toxic-appearing.  Cardiovascular:     Rate and Rhythm: Normal rate and regular rhythm.     Heart sounds: No murmur heard. Pulmonary:     Effort: Pulmonary effort is normal. No respiratory distress.     Breath sounds: No wheezing, rhonchi or rales.  Neurological:     Mental Status: She is alert.  Psychiatric:        Mood and Affect: Mood normal.        Behavior: Behavior normal.     Data Reviewed: UOP 2500 Creatinine 2.09 > 1.82 > 1.57  Family Communication: husband at bedside  Disposition: Status is: Inpatient Remains inpatient appropriate because: pain, AKI  Planned Discharge Destination: Home    Time spent: 20 minutes  Author: Murray Hodgkins, MD 01/04/2023 9:30 AM  For on call review [email or web address].

## 2023-01-05 LAB — BASIC METABOLIC PANEL
Anion gap: 10 (ref 5–15)
BUN: 10 mg/dL (ref 8–23)
CO2: 22 mmol/L (ref 22–32)
Calcium: 8.8 mg/dL — ABNORMAL LOW (ref 8.9–10.3)
Chloride: 109 mmol/L (ref 98–111)
Creatinine, Ser: 1.44 mg/dL — ABNORMAL HIGH (ref 0.44–1.00)
GFR, Estimated: 39 mL/min — ABNORMAL LOW (ref >60)
Glucose, Bld: 99 mg/dL (ref 70–99)
Potassium: 3.9 mmol/L (ref 3.5–5.1)
Sodium: 141 mmol/L (ref 135–145)

## 2023-01-07 NOTE — Discharge Summary (Signed)
Physician Discharge Summary   Patient: Amy Kerr MRN: [id number] DOB: 02/24/1951  Admit date:     01/02/2023  Discharge date: 01/05/2023  Discharge Physician: Kathlen ModyVijaya Maziah Smola   PCP: Georgann HousekeeperHusain, Karrar, MD   Recommendations at discharge:  Pleas follow up with PCP in one week.  Please cbc and bmp in one week.   Discharge Diagnoses: Principal Problem:   AKI (acute kidney injury) Active Problems:   CAD (coronary artery disease)   S/P angioplasty with stent   Benign hypertension   Mixed hyperlipidemia   Class 1 obesity due to excess calories with serious comorbidity and body mass index (BMI) of 32.0 to 32.9 in adult    Hospital Course: 72 year old woman status post cardiac stent but 2 weeks prior, started on Brilinta, Jardiance at that time.  Developed nausea with Jardiance.  Poor oral intake including liquid.  Presented with shortness of breath, nausea, abdominal pain.  Cardiac workup was reassuring, seen by cardiology, recommended to continue aspirin and Brilinta.  Jardiance discontinued.  Nausea and abdominal pain thought to be secondary to Jardiance.  Main issue at this point is AKI.  Assessment and Plan: AKI Secondary to poor oral intake secondary to nausea from Jardiance.  Probably complicated by Benicar. Baseline creatinine 0.87, was 2.09 on admission.  Creatinine improved to 1.44 Dyspnea Self-limited.  Thought to be anxiety induced per cardiology. Normal troponins, normal D-dimer, normal BNP.    Respiratory panel for flu COVID RSV negative.  Chest x-ray showed no acute abnormality. No further evaluation suggested.   Hyperlipidemia CAD s/p DES to OM1  Continue aspirin, Brilinta and per cardiology.  Continue beta-blocker Hold statin given muscle pains. Patient does not think she can tolerate this medication.  Hold olmesartan.   Essential hypertension Stable.  Continue home amlodipine and metoprolol    Body mass index is 30.73 kg/m.         Consultants:  cardiology Procedures performed: none  Disposition: Home Diet recommendation:  Discharge Diet Orders (From admission, onward)     Start     Ordered   01/05/23 0000  Diet - low sodium heart healthy        01/05/23 1109           Cardiac diet DISCHARGE MEDICATION: Allergies as of 01/05/2023       Reactions   Codeine Nausea Only   [name] [morphine] Nausea Only   Zinc Nausea Only        Medication List     STOP taking these medications    empagliflozin 10 MG Tabs tablet Commonly known as: Jardiance   olmesartan 40 MG tablet Commonly known as: Benicar   rosuvastatin 40 MG tablet Commonly known as: CRESTOR       TAKE these medications    amLODipine 10 MG tablet Commonly known as: NORVASC Take 1 tablet (10 mg total) by mouth every morning. What changed: when to take this   Aspirin Low Dose 81 MG tablet Generic drug: aspirin EC Take 1 tablet (81 mg total) by mouth daily. Swallow whole.   metoprolol succinate 25 MG 24 hr tablet Commonly known as: Toprol XL Take 1 tablet (25 mg total) by mouth every morning. Hold if systolic blood pressure (top number) less than 100 mmHg or pulse less than 60 bpm. What changed:  when to take this additional instructions   nitroGLYCERIN 0.4 MG SL tablet Commonly known as: Nitrostat Place 1 tablet (0.4 mg total) under the tongue every 5 (five) minutes as needed for  chest pain. If you require more than two tablets five minutes apart go to the nearest ER via EMS.   ticagrelor 90 MG Tabs tablet Commonly known as: BRILINTA Take 1 tablet (90 mg total) by mouth 2 (two) times daily.   Vitamin D-3 25 MCG (1000 UT) Caps Take 2,000 Units by mouth daily.        Follow-up Information     Georgann Housekeeper, MD. Schedule an appointment as soon as possible for a visit in 1 week(s).   Specialty: Internal Medicine Contact information: [address] [place] [address] [phone]                 Discharge Exam: Ceasar Mons Weights   01/02/23 0744  Weight: 81.2 kg  General exam: Appears calm and comfortable  Respiratory system: Clear to auscultation. Respiratory effort normal. Cardiovascular system: S1 & S2 heard, RRR. No JVD, No pedal edema. Gastrointestinal system: Abdomen is nondistended, soft and nontender. Central nervous system: Alert and oriented. No focal neurological deficits. Extremities: Symmetric 5 x 5 power. Skin: No rashes, lesions or ulcers Psychiatry: Mood & affect appropriate.    Condition at discharge: fair  The results of significant diagnostics from this hospitalization (including imaging, microbiology, ancillary and laboratory) are listed below for reference.   Imaging Studies: DG Chest Port 1 View  Result Date: 01/02/2023 CLINICAL DATA:  72 year old female with history of chest pain and dyspnea. EXAM: PORTABLE CHEST 1 VIEW COMPARISON:  No priors. FINDINGS: Lung volumes are normal. No consolidative airspace disease. No pleural effusions. No pneumothorax. No pulmonary nodule or mass noted. Pulmonary vasculature and the cardiomediastinal silhouette are within normal limits. Atherosclerosis in the thoracic aorta. IMPRESSION: 1.  No radiographic evidence of acute cardiopulmonary disease. 2. Aortic atherosclerosis. Electronically Signed   By: Trudie Reed M.D.   On: 01/02/2023 08:25   PCV ECHOCARDIOGRAM COMPLETE  Result Date: 12/23/2022 Echocardiogram 12/23/2022: Normal LV systolic function with visual EF 55-60%. Left ventricle cavity is normal in size. Normal left ventricular wall thickness. Normal global wall motion. Indeterminate diastolic filling pattern. Calculated EF 60%. Mild (Grade I) mitral regurgitation. Mild calcification of the mitral valve annulus. Mild mitral valve leaflet thickening. Structurally normal tricuspid valve with no regurgitation. No evidence of pulmonary hypertension. No prior available for comparison.   CARDIAC CATHETERIZATION  Result  Date: 12/21/2022 Images from the original result were not included. LM: Normal LAD: Mid 30% disease         High diag 1 with mild luminal irregularities Lcx: Prox OM1 with focal 95% stenosis RCA: Mid tandem 30% and 40% stenoses LVEDP normal LVEF normal Presentation with unstable angina Successful percutaneous coronary intervention prox OM1        PTCA and stent placement 3.0 X 16 mm Synergy drug-eluting stent        Post dilatation with 3.0X12 mm [place] balloon up to 20 atm Medical management for rest of the nonobstructive disease  Elder Negus, MD Pager: [phone] Office: [phone]    Microbiology: Results for orders placed or performed during the hospital encounter of 01/02/23  Resp panel by RT-PCR (RSV, Flu A&B, Covid) Anterior Nasal Swab     Status: None   Collection Time: 01/02/23  7:52 AM   Specimen: Anterior Nasal Swab  Result Value Ref Range Status   SARS Coronavirus 2 by RT PCR NEGATIVE NEGATIVE Final   Influenza A by PCR NEGATIVE NEGATIVE Final   Influenza B by PCR NEGATIVE NEGATIVE Final    Comment: (NOTE)  The Xpert Xpress SARS-CoV-2/FLU/RSV plus assay is intended as an aid in the diagnosis of influenza from Nasopharyngeal swab specimens and should not be used as a sole basis for treatment. Nasal washings and aspirates are unacceptable for Xpert Xpress SARS-CoV-2/FLU/RSV testing.  Fact Sheet for Patients: https://www.fda.gov/media/152166/download  Fact Sheet for Healthcare Providers: SeriousBroker.ithttps://www.fda.gov/media/152162/download  This test is not yeBloggerCourse.comt approved or cleared by the [place] FDA and has been authorized for detection and/or diagnosis of SARS-CoV-2 by FDA under an Emergency Use Authorization (EUA). This EUA will remain in effect (meaning this test can be used) for the duration of the COVID-19 declaration under Section 564(b)(1) of the Act, 21 U.S.C. section 360bbb-3(b)(1), unless the authorization is terminated or revoked.     Resp Syncytial Virus by PCR  NEGATIVE NEGATIVE Final    Comment: (NOTE) Fact Sheet for Patients: BloggerCourse.comhttps://www.fda.gov/media/152166/download  Fact Sheet for Healthcare Providers: SeriousBroker.ithttps://www.fda.gov/media/152162/download  This test is not yet approved or cleared by the [place] FDA and has been authorized for detection and/or diagnosis of SARS-CoV-2 by FDA under an Emergency Use Authorization (EUA). This EUA will remain in effect (meaning this test can be used) for the duration of the COVID-19 declaration under Section 564(b)(1) of the Act, 21 U.S.C. section 360bbb-3(b)(1), unless the authorization is terminated or revoked.  Performed at [facility], [address] [place], [place] [address]     Labs: CBC: Recent Labs  Lab 01/02/23 0807 01/03/23 0141  WBC 7.2 6.2  NEUTROABS 5.0  --   HGB 15.4* 12.7  HCT 43.7 37.7  MCV 87.1 89.1  PLT 271 210   Basic Metabolic Panel: Recent Labs  Lab 01/02/23 0807 01/03/23 0141 01/04/23 0650 01/05/23 0254  NA 138 135 141 141  K 4.1 4.0 3.8 3.9  CL 106 107 110 109  CO2 20* 20* 21* 22  GLUCOSE 122* 92 98 99  BUN 24* 22 14 10   CREATININE 2.09* 1.82* 1.57* 1.44*  CALCIUM 8.9 7.9* 8.6* 8.8*  MG 2.7*  --   --   --    Liver Function Tests: Recent Labs  Lab 01/02/23 0807 01/03/23 0141  AST 34 28  ALT 53* 43  ALKPHOS 76 62  BILITOT 1.4* 1.1  PROT 7.5 6.0*  ALBUMIN 3.8 3.0*   CBG: No results for input(s): "GLUCAP" in the last 168 hours.  Discharge time spent: 40 minutes  Signed: Kathlen ModyVijaya Hampton Cost, MD [facility] 01/07/2023

## 2023-01-08 ENCOUNTER — Encounter: Payer: Self-pay | Admitting: Cardiology

## 2023-01-10 DIAGNOSIS — I2510 Atherosclerotic heart disease of native coronary artery without angina pectoris: Secondary | ICD-10-CM | POA: Diagnosis not present

## 2023-01-10 DIAGNOSIS — Z955 Presence of coronary angioplasty implant and graft: Secondary | ICD-10-CM | POA: Diagnosis not present

## 2023-01-10 DIAGNOSIS — N179 Acute kidney failure, unspecified: Secondary | ICD-10-CM | POA: Diagnosis not present

## 2023-01-19 ENCOUNTER — Other Ambulatory Visit: Payer: Self-pay

## 2023-01-19 DIAGNOSIS — I2510 Atherosclerotic heart disease of native coronary artery without angina pectoris: Secondary | ICD-10-CM

## 2023-01-24 ENCOUNTER — Ambulatory Visit: Payer: Medicare Other | Admitting: Cardiology

## 2023-01-24 ENCOUNTER — Encounter: Payer: Self-pay | Admitting: Cardiology

## 2023-01-24 VITALS — BP 112/72 | HR 79 | Resp 16 | Ht 64.0 in | Wt 176.8 lb

## 2023-01-24 DIAGNOSIS — I700 Atherosclerosis of aorta: Secondary | ICD-10-CM | POA: Diagnosis not present

## 2023-01-24 DIAGNOSIS — Z95820 Peripheral vascular angioplasty status with implants and grafts: Secondary | ICD-10-CM

## 2023-01-24 DIAGNOSIS — E782 Mixed hyperlipidemia: Secondary | ICD-10-CM

## 2023-01-24 DIAGNOSIS — I10 Essential (primary) hypertension: Secondary | ICD-10-CM

## 2023-01-24 DIAGNOSIS — Z6832 Body mass index (BMI) 32.0-32.9, adult: Secondary | ICD-10-CM

## 2023-01-24 DIAGNOSIS — I2510 Atherosclerotic heart disease of native coronary artery without angina pectoris: Secondary | ICD-10-CM

## 2023-01-24 MED ORDER — ATORVASTATIN CALCIUM 40 MG PO TABS: 40.0000 mg | ORAL_TABLET | Freq: Every day | ORAL | 0 refills | Status: DC

## 2023-01-24 MED ORDER — TICAGRELOR 90 MG PO TABS
90.0000 mg | ORAL_TABLET | Freq: Two times a day (BID) | ORAL | 3 refills | Status: AC
Start: 2023-01-24 — End: 2024-01-24

## 2023-01-24 MED ORDER — METOPROLOL SUCCINATE ER 25 MG PO TB24
25.0000 mg | ORAL_TABLET | Freq: Every morning | ORAL | 11 refills | Status: DC
Start: 2023-01-24 — End: 2024-01-17

## 2023-01-24 NOTE — Progress Notes (Signed)
ID:  Amy Kerr, DOB 1951/04/28, MRN [id number]  PCP:  Georgann Housekeeper, MD  Cardiologist:  Tessa Lerner, DO, [facility] (established care 12/21/2022)  Date: 01/24/23 Last Office Visit: 12/28/2022  Chief Complaint  Patient presents with   Coronary Artery Disease   Follow-up    4 weeks    HPI  Amy Kerr is a 72 y.o. Caucasian female whose past medical history and cardiovascular risk factors include: History of unstable angina 12/2022 status post PCI to OM1, hypertension, hyperlipidemia, GERD, vitamin D deficiency, atherosclerosis of the aorta.  In March 2024 she presented with symptoms of unstable angina underwent heart catheterization was admitted artery disease and obtuse marginal 1.  She later underwent intervention and at follow-up her medical therapy was uptitrated.  However, she was unable to tolerate Jardiance which led to nausea and vomiting and she was hospitalized for dehydration and acute kidney injury.  She now presents for follow-up after discharge from 01/05/2023.   During her hospitalization olmesartan and Jardiance were held due to acute kidney injury at the time of discharge.  And rosuvastatin will also help according to the patient.  She is followed up with PCP and has had repeat labs and states that her renal function is slowly improving but not at baseline.  She is currently off of olmesartan and her home blood pressures provided to the patient were independently reviewed and visual estimation SBP is between 130-140 mmHg.  Patient is willing to be on statin therapy but is preferring Lipitor.  FUNCTIONAL STATUS: No structured exercise program or daily routine.   ALLERGIES: Allergies  Allergen Reactions   Codeine Nausea Only   Jardiance [Empagliflozin] Nausea Only and Other (See Comments)   [name] [Morphine] Nausea Only   Zinc Nausea Only    MEDICATION LIST PRIOR TO VISIT: Current Meds  Medication Sig   amLODipine (NORVASC) 10 MG tablet Take 1 tablet (10  mg total) by mouth every morning. (Patient taking differently: Take 10 mg by mouth daily.)   aspirin EC 81 MG tablet Take 1 tablet (81 mg total) by mouth daily. Swallow whole.   atorvastatin (LIPITOR) 40 MG tablet Take 1 tablet (40 mg total) by mouth daily.   Cholecalciferol (VITAMIN D-3) 25 MCG (1000 UT) CAPS Take 2,000 Units by mouth daily.   nitroGLYCERIN (NITROSTAT) 0.4 MG SL tablet Place 1 tablet (0.4 mg total) under the tongue every 5 (five) minutes as needed for chest pain. If you require more than two tablets five minutes apart go to the nearest ER via EMS.   [DISCONTINUED] metoprolol succinate (TOPROL XL) 25 MG 24 hr tablet Take 1 tablet (25 mg total) by mouth every morning. Hold if systolic blood pressure (top number) less than 100 mmHg or pulse less than 60 bpm. (Patient taking differently: Take 25 mg by mouth See admin instructions. 25 mg once daily at lunch. Hold if SBP < 100 or pulse < 60.)   [DISCONTINUED] ticagrelor (BRILINTA) 90 MG TABS tablet Take 1 tablet (90 mg total) by mouth 2 (two) times daily.     PAST MEDICAL HISTORY: Past Medical History:  Diagnosis Date   Coronary artery disease    Hyperlipidemia    Hypertension     PAST SURGICAL HISTORY: Past Surgical History:  Procedure Laterality Date   COLONOSCOPY N/A 10/23/2013   Procedure: COLONOSCOPY;  Surgeon: Charolett Bumpers, MD;  Location: [facility] ENDOSCOPY;  Service: Endoscopy;  Laterality: N/A;   CORONARY STENT INTERVENTION N/A 12/21/2022   Procedure: CORONARY STENT  INTERVENTION;  Surgeon: Elder Negus, MD;  Location: [facility] INVASIVE CV LAB;  Service: Cardiovascular;  Laterality: N/A;   GALLBLADDER SURGERY     LAPAROSCOPIC APPENDECTOMY N/A 03/22/2013   Procedure: APPENDECTOMY LAPAROSCOPIC;  Surgeon: Currie Paris, MD;  Location: [facility] OR;  Service: General;  Laterality: N/A;   LEFT HEART CATH AND CORONARY ANGIOGRAPHY N/A 12/21/2022   Procedure: LEFT HEART CATH AND CORONARY ANGIOGRAPHY;  Surgeon: Elder Negus,  MD;  Location: [facility] INVASIVE CV LAB;  Service: Cardiovascular;  Laterality: N/A;    FAMILY HISTORY: The patient family history includes Cancer - Cervical in her mother; Esophageal cancer in her sister; Heart disease in her brother and father; Stomach cancer in her brother.  SOCIAL HISTORY:  The patient  reports that she has never smoked. She has never used smokeless tobacco. She reports that she does not drink alcohol and does not use drugs.  REVIEW OF SYSTEMS: Review of Systems  Cardiovascular:  Negative for chest pain, claudication, dyspnea on exertion, irregular heartbeat, leg swelling, near-syncope, orthopnea, palpitations, paroxysmal nocturnal dyspnea and syncope.  Respiratory:  Negative for shortness of breath.   Hematologic/Lymphatic: Negative for bleeding problem.  Musculoskeletal:  Negative for muscle cramps and myalgias.  Neurological:  Negative for dizziness and light-headedness.    PHYSICAL EXAM:    01/24/2023   11:26 AM 01/05/2023    8:27 AM 01/05/2023    3:35 AM  Vitals with BMI  Height 5\' 4"     Weight 176 lbs 13 oz    BMI 30.33    Systolic 112 126 161  Diastolic 72 61 68  Pulse 79 65 67    Physical Exam  Constitutional: No distress.  Age appropriate, hemodynamically stable.   Neck: No JVD present.  Cardiovascular: Normal rate, regular rhythm, S1 normal, S2 normal, intact distal pulses and normal pulses. Exam reveals no gallop, no S3 and no S4.  No murmur heard. Pulmonary/Chest: Effort normal and breath sounds normal. No stridor. She has no wheezes. She has no rales.  Abdominal: Soft. Bowel sounds are normal. She exhibits no distension. There is no abdominal tenderness.  Musculoskeletal:        General: No edema.     Cervical back: Neck supple.  Neurological: She is alert and oriented to person, place, and time. She has intact cranial nerves (2-12).  Skin: Skin is warm and moist.   CARDIAC DATABASE: EKG: January 24, 2023: Sinus rhythm, 69 bpm, TWI in the lateral  leads (improving compared to last ECG tracing), without underlying injury pattern.  Echocardiogram: 12/23/2022:  Normal LV systolic function with visual EF 55-60%. Left ventricle cavity  is normal in size. Normal left ventricular wall thickness. Normal global  wall motion. Indeterminate diastolic filling pattern. Calculated EF 60%.  Mild (Grade I) mitral regurgitation. Mild calcification of the mitral  valve annulus. Mild mitral valve leaflet thickening.  Structurally normal tricuspid valve with no regurgitation. No evidence of  pulmonary hypertension.  No prior available for comparison.   Stress Testing: No results found for this or any previous visit from the past 1095 days.  Heart Catheterization: 12/21/2022 LM: Normal LAD: Mid 30% disease         High diag 1 with mild luminal irregularities Lcx: Prox OM1 with focal 95% stenosis RCA: Mid tandem 30% and 40% stenoses   LVEDP normal LVEF normal   Presentation with unstable angina Successful percutaneous coronary intervention prox OM1        PTCA and stent placement 3.0 X 16  mm Synergy drug-eluting stent        Post dilatation with 3.0X12 mm [place] balloon up to 20 atm Medical management for rest of the nonobstructive disease      LABORATORY DATA: External Labs: Collected: 12/06/2022 provided by referring physician. A1c 5.7 Hemoglobin 14.2, hematocrit 42.8% BUN 14, creatinine 1.02 Sodium 141, potassium 4.5, chloride 107, bicarb 28. AST 24, ALT 30, alkaline phosphatase 66  TSH 2.44 Total cholesterol 167, triglycerides 219, HDL 46, LDL calculated 85, non-HDL 122 High sensitive troponin 13  No results found for: "CHOL", "HDL", "LDLCALC", "LDLDIRECT", "TRIG", "CHOLHDL"    IMPRESSION:    ICD-10-CM   1. Coronary artery disease involving native coronary artery of native heart without angina pectoris  I25.10 atorvastatin (LIPITOR) 40 MG tablet    Lipid Panel With LDL/HDL Ratio    LDL cholesterol, direct    CMP14+EGFR     metoprolol succinate (TOPROL XL) 25 MG 24 hr tablet    ticagrelor (BRILINTA) 90 MG TABS tablet    EKG 12-Lead    2. S/P angioplasty with stent  Z95.820 atorvastatin (LIPITOR) 40 MG tablet    Lipid Panel With LDL/HDL Ratio    LDL cholesterol, direct    CMP14+EGFR    metoprolol succinate (TOPROL XL) 25 MG 24 hr tablet    ticagrelor (BRILINTA) 90 MG TABS tablet    3. Atherosclerosis of aorta  I70.0 EKG 12-Lead    4. Benign hypertension  I10     5. Mixed hyperlipidemia  E78.2 atorvastatin (LIPITOR) 40 MG tablet    Lipid Panel With LDL/HDL Ratio    LDL cholesterol, direct    CMP14+EGFR    6. Class 1 obesity due to excess calories without serious comorbidity with body mass index (BMI) of 32.0 to 32.9 in adult  E66.09    Z68.32        RECOMMENDATIONS: Amy Kerr is a 72 y.o. Caucasian female whose past medical history and cardiac risk factors include: Hypertension, hyperlipidemia, GERD, vitamin D deficiency, atherosclerosis of the aorta.  Coronary artery disease involving native coronary artery of native heart without angina pectoris S/P angioplasty with stent Presented with unstable angina in March 2024. Underwent angiography urgently and was noted to have obstructive disease in the OM distribution status post coronary intervention. Dual antiplatelet therapy for 12 months. Brilinta refilled. Start Lipitor 40 mg p.o. nightly with labs in 6 weeks to reevaluate lipids. She plans to start cardiac rehab after today's visit. Recommend LDL less than 55 mg/dL and triglyceride levels less than 149 mg/dL  Atherosclerosis of aorta (HCC) Continue antiplatelet therapy and statin therapy  Benign hypertension Office blood pressures are well-controlled. Home blood pressures could be better controlled. Currently working with PCP to make sure that the renal function is back to baseline prior to restarting losartan. Will defer BP management to PCP for now.  Mixed hyperlipidemia Will  transition her from Crestor to Lipitor as per her wishes.   She denies myalgia or other side effects. Most recent lipids dated March 2024, independently reviewed as noted above. Currently managed by primary care provider.  Class 1 obesity due to excess calories without serious comorbidity with body mass index (BMI) of 32.0 to 32.9 in adult Body mass index is 30.35 kg/m. I reviewed with her importance of diet, regular physical activity/exercise, weight loss.   Patient is educated on the importance of increasing physical activity gradually as tolerated with a goal of moderate intensity exercise for 30 minutes a day 5 days a week.  FINAL MEDICATION LIST END OF ENCOUNTER: Meds ordered this encounter  Medications   atorvastatin (LIPITOR) 40 MG tablet    Sig: Take 1 tablet (40 mg total) by mouth daily.    Dispense:  90 tablet    Refill:  0   metoprolol succinate (TOPROL XL) 25 MG 24 hr tablet    Sig: Take 1 tablet (25 mg total) by mouth every morning. Hold if systolic blood pressure (top number) less than 100 mmHg or pulse less than 60 bpm.    Dispense:  30 tablet    Refill:  11   ticagrelor (BRILINTA) 90 MG TABS tablet    Sig: Take 1 tablet (90 mg total) by mouth 2 (two) times daily.    Dispense:  180 tablet    Refill:  3    Medications Discontinued During This Encounter  Medication Reason   ticagrelor (BRILINTA) 90 MG TABS tablet Reorder   metoprolol succinate (TOPROL XL) 25 MG 24 hr tablet Reorder     Current Outpatient Medications:    amLODipine (NORVASC) 10 MG tablet, Take 1 tablet (10 mg total) by mouth every morning. (Patient taking differently: Take 10 mg by mouth daily.), Disp: 90 tablet, Rfl: 0   aspirin EC 81 MG tablet, Take 1 tablet (81 mg total) by mouth daily. Swallow whole., Disp: 90 tablet, Rfl: 3   atorvastatin (LIPITOR) 40 MG tablet, Take 1 tablet (40 mg total) by mouth daily., Disp: 90 tablet, Rfl: 0   Cholecalciferol (VITAMIN D-3) 25 MCG (1000 UT) CAPS, Take 2,000  Units by mouth daily., Disp: , Rfl:    nitroGLYCERIN (NITROSTAT) 0.4 MG SL tablet, Place 1 tablet (0.4 mg total) under the tongue every 5 (five) minutes as needed for chest pain. If you require more than two tablets five minutes apart go to the nearest ER via EMS., Disp: 30 tablet, Rfl: 0   metoprolol succinate (TOPROL XL) 25 MG 24 hr tablet, Take 1 tablet (25 mg total) by mouth every morning. Hold if systolic blood pressure (top number) less than 100 mmHg or pulse less than 60 bpm., Disp: 30 tablet, Rfl: 11   ticagrelor (BRILINTA) 90 MG TABS tablet, Take 1 tablet (90 mg total) by mouth 2 (two) times daily., Disp: 180 tablet, Rfl: 3  Orders Placed This Encounter  Procedures   Lipid Panel With LDL/HDL Ratio   LDL cholesterol, direct   [id number]   EKG 12-Lead    There are no Patient Instructions on file for this visit.   --Continue cardiac medications as reconciled in final medication list. --Return in about 6 months (around 07/26/2023) for Follow up, CAD. or sooner if needed. --Continue follow-up with your primary care physician regarding the management of your other chronic comorbid conditions.  Patient's questions and concerns were addressed to her satisfaction. She voices understanding of the instructions provided during this encounter.   This note was created using a voice recognition software as a result there may be grammatical errors inadvertently enclosed that do not reflect the nature of this encounter. Every attempt is made to correct such errors.  Tessa Lerner, [place], [facility]  Pager:  [phone] Office: [phone]

## 2023-01-31 ENCOUNTER — Telehealth (HOSPITAL_COMMUNITY): Payer: Self-pay

## 2023-01-31 NOTE — Telephone Encounter (Signed)
  Pt insurance is active and benefits verified through [employer] Medicare. Co-pay $0.00, DED $0.00/$0.00 met, out of pocket $3,600.00/$1,353.12 met, co-insurance 0%. No pre-authorization required. Passport, 01/31/23 @ 1:44PM, REF#[id number]   How many CR sessions are covered? (36 sessions/visits for TCR, 72 sessions/visits for ICR)72 visits Is this a lifetime maximum or an annual maximum? Annual Has the member used any of these services to date? No Is there a time limit (weeks/months) on start of program and/or program completion? No     Will contact patient to see if she is interested in the Cardiac Rehab Program.

## 2023-01-31 NOTE — Telephone Encounter (Signed)
Called patient to see if she was interested in participating in the Cardiac Rehab Program. Patient stated yes. Patient will come in for orientation on 02/01/23 @ 10:30AM and will attend the 8:15AM exercise class. Will check pt insurance closer to scheduling.     Pensions consultant.

## 2023-02-01 ENCOUNTER — Encounter (HOSPITAL_COMMUNITY)
Admission: RE | Admit: 2023-02-01 | Discharge: 2023-02-01 | Disposition: A | Discharge status: Home / Self Care | Payer: Medicare Other | Source: Ambulatory Visit | Attending: Cardiology | Admitting: Cardiology

## 2023-02-01 VITALS — BP 118/78 | HR 70 | Ht 64.5 in | Wt 176.4 lb

## 2023-02-01 DIAGNOSIS — I10 Essential (primary) hypertension: Secondary | ICD-10-CM | POA: Diagnosis not present

## 2023-02-01 DIAGNOSIS — Z955 Presence of coronary angioplasty implant and graft: Secondary | ICD-10-CM | POA: Insufficient documentation

## 2023-02-01 NOTE — Progress Notes (Signed)
Cardiac Individual Treatment Plan  Patient Details  Name: Amy Kerr MRN: [id number] Date of Birth: [birth year] Referring Provider:   Flowsheet Row INTENSIVE CARDIAC REHAB ORIENT from 02/01/2023 in [facility]  Referring Provider Tessa Lerner, MD       Initial Encounter Date:  Flowsheet Row INTENSIVE CARDIAC REHAB ORIENT from 02/01/2023 in [facility]  Date 02/01/23       Visit Diagnosis: 12/21/22 Status post coronary artery stent placement OM1  Patient's Home Medications on Admission:  Current Outpatient Medications:    amLODipine (NORVASC) 10 MG tablet, Take 1 tablet (10 mg total) by mouth every morning. (Patient taking differently: Take 10 mg by mouth daily.), Disp: 90 tablet, Rfl: 0   aspirin EC 81 MG tablet, Take 1 tablet (81 mg total) by mouth daily. Swallow whole., Disp: 90 tablet, Rfl: 3   atorvastatin (LIPITOR) 40 MG tablet, Take 1 tablet (40 mg total) by mouth daily., Disp: 90 tablet, Rfl: 0   Cholecalciferol (VITAMIN D-3) 25 MCG (1000 UT) CAPS, Take 2,000 Units by mouth daily., Disp: , Rfl:    metoprolol succinate (TOPROL XL) 25 MG 24 hr tablet, Take 1 tablet (25 mg total) by mouth every morning. Hold if systolic blood pressure (top number) less than 100 mmHg or pulse less than 60 bpm., Disp: 30 tablet, Rfl: 11   nitroGLYCERIN (NITROSTAT) 0.4 MG SL tablet, Place 1 tablet (0.4 mg total) under the tongue every 5 (five) minutes as needed for chest pain. If you require more than two tablets five minutes apart go to the nearest ER via EMS., Disp: 30 tablet, Rfl: 0   ticagrelor (BRILINTA) 90 MG TABS tablet, Take 1 tablet (90 mg total) by mouth 2 (two) times daily., Disp: 180 tablet, Rfl: 3  Past Medical History: Past Medical History:  Diagnosis Date   Coronary artery disease    Hyperlipidemia    Hypertension     Tobacco Use: Social History   Tobacco Use   Smoking Status Never  Smokeless Tobacco Never    Labs: Review Flowsheet        No data to display          Capillary Blood Glucose: No results found for: "GLUCAP"   Exercise Target Goals: Exercise Program Goal: Individual exercise prescription set using results from initial 6 min walk test and THRR while considering  patient's activity barriers and safety.   Exercise Prescription Goal: Initial exercise prescription builds to 30-45 minutes a day of aerobic activity, 2-3 days per week.  Home exercise guidelines will be given to patient during program as part of exercise prescription that the participant will acknowledge.  Activity Barriers & Risk Stratification:  Activity Barriers & Cardiac Risk Stratification - 02/01/23 1330       Activity Barriers & Cardiac Risk Stratification   Activity Barriers Neck/Spine Problems;Arthritis;Back Problems;Joint Problems;Deconditioning    Cardiac Risk Stratification High             6 Minute Walk:  6 Minute Walk     Row Name 02/01/23 1123         6 Minute Walk   Phase Initial     Distance 1164 feet     Walk Time 6 minutes     # of Rest Breaks 0     MPH 2.2     METS 2.42     RPE 8  Perceived Dyspnea  0     VO2 Peak 8.5     Symptoms No     Resting HR 70 bpm     Resting BP 118/78     Resting Oxygen Saturation  98 %     Exercise Oxygen Saturation  during 6 min walk 98 %     Max Ex. HR 94 bpm     Max Ex. BP 144/76     2 Minute Post BP 124/70              Oxygen Initial Assessment:   Oxygen Re-Evaluation:   Oxygen Discharge (Final Oxygen Re-Evaluation):   Initial Exercise Prescription:  Initial Exercise Prescription - 02/01/23 1300       Date of Initial Exercise RX and Referring Provider   Date 02/01/23    Referring Provider Tessa Lerner, MD    Expected Discharge Date 04/15/23      NuStep   Level 2    SPM 75    Minutes 15    METs 2.4      Track   Laps 8    Minutes 15    METs 2.4       Prescription Details   Frequency (times per week) 3    Duration Progress to 30 minutes of continuous aerobic without signs/symptoms of physical distress      Intensity   THRR 40-80% of Max Heartrate 59-118    Ratings of Perceived Exertion 11-13    Perceived Dyspnea 0-4      Progression   Progression Continue progressive overload as per policy without signs/symptoms or physical distress.      Resistance Training   Training Prescription Yes    Weight 2 lbs    Reps 10-15             Perform Capillary Blood Glucose checks as needed.  Exercise Prescription Changes:   Exercise Comments:   Exercise Goals and Review:   Exercise Goals     Row Name 02/01/23 1331             Exercise Goals   Increase Physical Activity Yes       Intervention Provide advice, education, support and counseling about physical activity/exercise needs.;Develop an individualized exercise prescription for aerobic and resistive training based on initial evaluation findings, risk stratification, comorbidities and participant's personal goals.       Expected Outcomes Short Term: Attend rehab on a regular basis to increase amount of physical activity.;Long Term: Add in home exercise to make exercise part of routine and to increase amount of physical activity.;Long Term: Exercising regularly at least 3-5 days a week.       Increase Strength and Stamina Yes       Intervention Provide advice, education, support and counseling about physical activity/exercise needs.;Develop an individualized exercise prescription for aerobic and resistive training based on initial evaluation findings, risk stratification, comorbidities and participant's personal goals.       Expected Outcomes Short Term: Increase workloads from initial exercise prescription for resistance, speed, and METs.;Short Term: Perform resistance training exercises routinely during rehab and add in resistance training at home;Long Term: Improve  cardiorespiratory fitness, muscular endurance and strength as measured by increased METs and functional capacity ( )       Able to understand and use rate of perceived exertion (RPE) scale Yes       Intervention Provide education and explanation on how to use RPE scale       Expected Outcomes Short  Term: Able to use RPE daily in rehab to express subjective intensity level;Long Term:  Able to use RPE to guide intensity level when exercising independently       Knowledge and understanding of Target Heart Rate Range (THRR) Yes       Intervention Provide education and explanation of THRR including how the numbers were predicted and where they are located for reference       Expected Outcomes Short Term: Able to state/look up THRR;Long Term: Able to use THRR to govern intensity when exercising independently;Short Term: Able to use daily as guideline for intensity in rehab       Understanding of Exercise Prescription Yes       Intervention Provide education, explanation, and written materials on patient's individual exercise prescription       Expected Outcomes Short Term: Able to explain program exercise prescription;Long Term: Able to explain home exercise prescription to exercise independently                Exercise Goals Re-Evaluation :   Discharge Exercise Prescription (Final Exercise Prescription Changes):   Nutrition:  Target Goals: Understanding of nutrition guidelines, daily intake of sodium 1500mg , cholesterol 200mg , calories 30% from fat and 7% or less from saturated fats, daily to have 5 or more servings of fruits and vegetables.  Biometrics:  Pre Biometrics - 02/01/23 1024       Pre Biometrics   Waist Circumference 37.75 inches    Hip Circumference 44 inches    Waist to Hip Ratio 0.86 %    Triceps Skinfold 30 mm    % Body Fat 41.7 %    Grip Strength 33 kg    Flexibility 17.5 in    Single Leg Stand 30 seconds              Nutrition Therapy Plan and Nutrition  Goals:   Nutrition Assessments:  MEDIFICTS Score Key: ?70 Need to make dietary changes  40-70 Heart Healthy Diet ? 40 Therapeutic Level Cholesterol Diet    Picture Your Plate Scores: <25 Unhealthy dietary pattern with much room for improvement. 41-50 Dietary pattern unlikely to meet recommendations for good health and room for improvement. 51-60 More healthful dietary pattern, with some room for improvement.  >60 Healthy dietary pattern, although there may be some specific behaviors that could be improved.    Nutrition Goals Re-Evaluation:   Nutrition Goals Re-Evaluation:   Nutrition Goals Discharge (Final Nutrition Goals Re-Evaluation):   Psychosocial: Target Goals: Acknowledge presence or absence of significant depression and/or stress, maximize coping skills, provide positive support system. Participant is able to verbalize types and ability to use techniques and skills needed for reducing stress and depression.  Initial Review & Psychosocial Screening:  Initial Psych Review & Screening - 02/01/23 1042       Initial Review   Current issues with None Identified      Family Dynamics   Good Support System? Yes    Comments Has spouse, 3 children, and several grandchildren for support      Barriers   Psychosocial barriers to participate in program There are no identifiable barriers or psychosocial needs.      Screening Interventions   Interventions Encouraged to exercise             Quality of Life Scores:  Quality of Life - 02/01/23 1336       Quality of Life   Select Quality of Life      Quality of Life Scores  Health/Function Pre 29.1 %    Socioeconomic Pre 30 %    Psych/Spiritual Pre 30 %    Family Pre 30 %    GLOBAL Pre 29.6 %            Scores of 19 and below usually indicate a poorer quality of life in these areas.  A difference of  2-3 points is a clinically meaningful difference.  A difference of 2-3 points in the total score of the  Quality of Life Index has been associated with significant improvement in overall quality of life, self-image, physical symptoms, and general health in studies assessing change in quality of life.  PHQ-9: Review Flowsheet       02/01/2023  Depression screen PHQ 2/9  Decreased Interest 0  Down, Depressed, Hopeless 0  PHQ - 2 Score 0  Altered sleeping 1  Tired, decreased energy 1  Change in appetite 0  Feeling bad or failure about yourself  0  Trouble concentrating 0  Moving slowly or fidgety/restless 0  Suicidal thoughts 0  PHQ-9 Score 2  Difficult doing work/chores Not difficult at all   Interpretation of Total Score  Total Score Depression Severity:  1-4 = Minimal depression, 5-9 = Mild depression, 10-14 = Moderate depression, 15-19 = Moderately severe depression, 20-27 = Severe depression   Psychosocial Evaluation and Intervention:   Psychosocial Re-Evaluation:   Psychosocial Discharge (Final Psychosocial Re-Evaluation):   Vocational Rehabilitation: Provide vocational rehab assistance to qualifying candidates.   Vocational Rehab Evaluation & Intervention:  Vocational Rehab - 02/01/23 1041       Initial Vocational Rehab Evaluation & Intervention   Assessment shows need for Vocational Rehabilitation No   Pt is retired            Education: Education Goals: Education classes will be provided on a weekly basis, covering required topics. Participant will state understanding/return demonstration of topics presented.     Core Videos: Exercise    Move It!  Clinical staff conducted group or individual video education with verbal and written material and guidebook.  Patient learns the recommended Pritikin exercise program. Exercise with the goal of living a long, healthy life. Some of the health benefits of exercise include controlled diabetes, healthier blood pressure levels, improved cholesterol levels, improved heart and lung capacity, improved sleep, and better  body composition. Everyone should speak with their doctor before starting or changing an exercise routine.  Biomechanical Limitations Clinical staff conducted group or individual video education with verbal and written material and guidebook.  Patient learns how biomechanical limitations can impact exercise and how we can mitigate and possibly overcome limitations to have an impactful and balanced exercise routine.  Body Composition Clinical staff conducted group or individual video education with verbal and written material and guidebook.  Patient learns that body composition (ratio of muscle mass to fat mass) is a key component to assessing overall fitness, rather than body weight alone. Increased fat mass, especially visceral belly fat, can put [place] at increased risk for metabolic syndrome, type 2 diabetes, heart disease, and even death. It is recommended to combine diet and exercise (cardiovascular and resistance training) to improve your body composition. Seek guidance from your physician and exercise physiologist before implementing an exercise routine.  Exercise Action Plan Clinical staff conducted group or individual video education with verbal and written material and guidebook.  Patient learns the recommended strategies to achieve and enjoy long-term exercise adherence, including variety, self-motivation, self-efficacy, and positive decision making. Benefits of exercise include  fitness, good health, weight management, more energy, better sleep, less stress, and overall well-being.  Medical   Heart Disease Risk Reduction Clinical staff conducted group or individual video education with verbal and written material and guidebook.  Patient learns our heart is our most vital organ as it circulates oxygen, nutrients, white blood cells, and hormones throughout the entire body, and carries waste away. Data supports a plant-based eating plan like the Pritikin Program for its effectiveness in slowing  progression of and reversing heart disease. The video provides a number of recommendations to address heart disease.   Metabolic Syndrome and Belly Fat  Clinical staff conducted group or individual video education with verbal and written material and guidebook.  Patient learns what metabolic syndrome is, how it leads to heart disease, and how one can reverse it and keep it from coming back. You have metabolic syndrome if you have 3 of the following 5 criteria: abdominal obesity, high blood pressure, high triglycerides, low HDL cholesterol, and high blood sugar.  Hypertension and Heart Disease Clinical staff conducted group or individual video education with verbal and written material and guidebook.  Patient learns that high blood pressure, or hypertension, is very common in the [place]. Hypertension is largely due to excessive salt intake, but other important risk factors include being overweight, physical inactivity, drinking too much alcohol, smoking, and not eating enough potassium from fruits and vegetables. High blood pressure is a leading risk factor for heart attack, stroke, congestive heart failure, dementia, kidney failure, and premature death. Long-term effects of excessive salt intake include stiffening of the arteries and thickening of heart muscle and organ damage. Recommendations include ways to reduce hypertension and the risk of heart disease.  Diseases of Our Time - Focusing on Diabetes Clinical staff conducted group or individual video education with verbal and written material and guidebook.  Patient learns why the best way to stop diseases of our time is prevention, through food and other lifestyle changes. Medicine (such as prescription pills and surgeries) is often only a Band-Aid on the problem, not a long-term solution. Most common diseases of our time include obesity, type 2 diabetes, hypertension, heart disease, and cancer. The Pritikin Program is recommended and has been  proven to help reduce, reverse, and/or prevent the damaging effects of metabolic syndrome.  Nutrition   Overview of the Pritikin Eating Plan  Clinical staff conducted group or individual video education with verbal and written material and guidebook.  Patient learns about the Pritikin Eating Plan for disease risk reduction. The Pritikin Eating Plan emphasizes a wide variety of unrefined, minimally-processed carbohydrates, like fruits, vegetables, whole grains, and legumes. Go, Caution, and Stop food choices are explained. Plant-based and lean animal proteins are emphasized. Rationale provided for low sodium intake for blood pressure control, low added sugars for blood sugar stabilization, and low added fats and oils for coronary artery disease risk reduction and weight management.  Calorie Density  Clinical staff conducted group or individual video education with verbal and written material and guidebook.  Patient learns about calorie density and how it impacts the Pritikin Eating Plan. Knowing the characteristics of the food you choose will help you decide whether those foods will lead to weight gain or weight loss, and whether you want to consume more or less of them. Weight loss is usually a side effect of the Pritikin Eating Plan because of its focus on low calorie-dense foods.  Label Reading  Clinical staff conducted group or individual video education with verbal  and written material and guidebook.  Patient learns about the Pritikin recommended label reading guidelines and corresponding recommendations regarding calorie density, added sugars, sodium content, and whole grains.  Dining Out - Part 1  Clinical staff conducted group or individual video education with verbal and written material and guidebook.  Patient learns that restaurant meals can be sabotaging because they can be so high in calories, fat, sodium, and/or sugar. Patient learns recommended strategies on how to positively address  this and avoid unhealthy pitfalls.  Facts on Fats  Clinical staff conducted group or individual video education with verbal and written material and guidebook.  Patient learns that lifestyle modifications can be just as effective, if not more so, as many medications for lowering your risk of heart disease. A Pritikin lifestyle can help to reduce your risk of inflammation and atherosclerosis (cholesterol build-up, or plaque, in the artery walls). Lifestyle interventions such as dietary choices and physical activity address the cause of atherosclerosis. A review of the types of fats and their impact on blood cholesterol levels, along with dietary recommendations to reduce fat intake is also included.  Nutrition Action Plan  Clinical staff conducted group or individual video education with verbal and written material and guidebook.  Patient learns how to incorporate Pritikin recommendations into their lifestyle. Recommendations include planning and keeping personal health goals in mind as an important part of their success.  Healthy Mind-Set    Healthy Minds, Bodies, Hearts  Clinical staff conducted group or individual video education with verbal and written material and guidebook.  Patient learns how to identify when they are stressed. Video will discuss the impact of that stress, as well as the many benefits of stress management. Patient will also be introduced to stress management techniques. The way we think, act, and feel has an impact on our hearts.  How Our Thoughts Can Heal Our Hearts  Clinical staff conducted group or individual video education with verbal and written material and guidebook.  Patient learns that negative thoughts can cause depression and anxiety. This can result in negative lifestyle behavior and serious health problems. Cognitive behavioral therapy is an effective method to help control our thoughts in order to change and improve our emotional outlook.  Additional  Videos:  Exercise    Improving Performance  Clinical staff conducted group or individual video education with verbal and written material and guidebook.  Patient learns to use a non-linear approach by alternating intensity levels and lengths of time spent exercising to help burn more calories and lose more body fat. Cardiovascular exercise helps improve heart health, metabolism, hormonal balance, blood sugar control, and recovery from fatigue. Resistance training improves strength, endurance, balance, coordination, reaction time, metabolism, and muscle mass. Flexibility exercise improves circulation, posture, and balance. Seek guidance from your physician and exercise physiologist before implementing an exercise routine and learn your capabilities and proper form for all exercise.  Introduction to Yoga  Clinical staff conducted group or individual video education with verbal and written material and guidebook.  Patient learns about yoga, a discipline of the coming together of mind, breath, and body. The benefits of yoga include improved flexibility, improved range of motion, better posture and core strength, increased lung function, weight loss, and positive self-image. Yoga's heart health benefits include lowered blood pressure, healthier heart rate, decreased cholesterol and triglyceride levels, improved immune function, and reduced stress. Seek guidance from your physician and exercise physiologist before implementing an exercise routine and learn your capabilities and proper form for all exercise.  Medical   Aging: Enhancing Your Quality of Life  Clinical staff conducted group or individual video education with verbal and written material and guidebook.  Patient learns key strategies and recommendations to stay in good physical health and enhance quality of life, such as prevention strategies, having an advocate, securing a Health Care Proxy and Power of Attorney, and keeping a list of medications  and system for tracking them. It also discusses how to avoid risk for bone loss.  Biology of Weight Control  Clinical staff conducted group or individual video education with verbal and written material and guidebook.  Patient learns that weight gain occurs because we consume more calories than we burn (eating more, moving less). Even if your body weight is normal, you may have higher ratios of fat compared to muscle mass. Too much body fat puts you at increased risk for cardiovascular disease, heart attack, stroke, type 2 diabetes, and obesity-related cancers. In addition to exercise, following the Pritikin Eating Plan can help reduce your risk.  Decoding Lab Results  Clinical staff conducted group or individual video education with verbal and written material and guidebook.  Patient learns that lab test reflects one measurement whose values change over time and are influenced by many factors, including medication, stress, sleep, exercise, food, hydration, pre-existing medical conditions, and more. It is recommended to use the knowledge from this video to become more involved with your lab results and evaluate your numbers to speak with your doctor.   Diseases of Our Time - Overview  Clinical staff conducted group or individual video education with verbal and written material and guidebook.  Patient learns that according to the CDC, 50% to 70% of chronic diseases (such as obesity, type 2 diabetes, elevated lipids, hypertension, and heart disease) are avoidable through lifestyle improvements including healthier food choices, listening to satiety cues, and increased physical activity.  Sleep Disorders Clinical staff conducted group or individual video education with verbal and written material and guidebook.  Patient learns how good quality and duration of sleep are important to overall health and well-being. Patient also learns about sleep disorders and how they impact health along with  recommendations to address them, including discussing with a physician.  Nutrition  Dining Out - Part 2 Clinical staff conducted group or individual video education with verbal and written material and guidebook.  Patient learns how to plan ahead and communicate in order to maximize their dining experience in a healthy and nutritious manner. Included are recommended food choices based on the type of restaurant the patient is visiting.   Fueling a Banker conducted group or individual video education with verbal and written material and guidebook.  There is a strong connection between our food choices and our health. Diseases like obesity and type 2 diabetes are very prevalent and are in large-part due to lifestyle choices. The Pritikin Eating Plan provides plenty of food and hunger-curbing satisfaction. It is easy to follow, affordable, and helps reduce health risks.  Menu Workshop  Clinical staff conducted group or individual video education with verbal and written material and guidebook.  Patient learns that restaurant meals can sabotage health goals because they are often packed with calories, fat, sodium, and sugar. Recommendations include strategies to plan ahead and to communicate with the manager, chef, or server to help order a healthier meal.  Planning Your Eating Strategy  Clinical staff conducted group or individual video education with verbal and written material and guidebook.  Patient learns about the  Pritikin Eating Plan and its benefit of reducing the risk of disease. The Pritikin Eating Plan does not focus on calories. Instead, it emphasizes high-quality, nutrient-rich foods. By knowing the characteristics of the foods, we choose, we can determine their calorie density and make informed decisions.  Targeting Your Nutrition Priorities  Clinical staff conducted group or individual video education with verbal and written material and guidebook.  Patient learns  that lifestyle habits have a tremendous impact on disease risk and progression. This video provides eating and physical activity recommendations based on your personal health goals, such as reducing LDL cholesterol, losing weight, preventing or controlling type 2 diabetes, and reducing high blood pressure.  Vitamins and Minerals  Clinical staff conducted group or individual video education with verbal and written material and guidebook.  Patient learns different ways to obtain key vitamins and minerals, including through a recommended healthy diet. It is important to discuss all supplements you take with your doctor.   Healthy Mind-Set    Smoking Cessation  Clinical staff conducted group or individual video education with verbal and written material and guidebook.  Patient learns that cigarette smoking and tobacco addiction pose a serious health risk which affects millions of people. Stopping smoking will significantly reduce the risk of heart disease, lung disease, and many forms of cancer. Recommended strategies for quitting are covered, including working with your doctor to develop a successful plan.  Culinary   Becoming a Set designer conducted group or individual video education with verbal and written material and guidebook.  Patient learns that cooking at home can be healthy, cost-effective, quick, and puts them in control. Keys to cooking healthy recipes will include looking at your recipe, assessing your equipment needs, planning ahead, making it simple, choosing cost-effective seasonal ingredients, and limiting the use of added fats, salts, and sugars.  Cooking - Breakfast and Snacks  Clinical staff conducted group or individual video education with verbal and written material and guidebook.  Patient learns how important breakfast is to satiety and nutrition through the entire day. Recommendations include key foods to eat during breakfast to help stabilize blood sugar  levels and to prevent overeating at meals later in the day. Planning ahead is also a key component.  Cooking - Educational psychologist conducted group or individual video education with verbal and written material and guidebook.  Patient learns eating strategies to improve overall health, including an approach to cook more at home. Recommendations include thinking of animal protein as a side on your plate rather than center stage and focusing instead on lower calorie dense options like vegetables, fruits, whole grains, and plant-based proteins, such as beans. Making sauces in large quantities to freeze for later and leaving the skin on your vegetables are also recommended to maximize your experience.  Cooking - Healthy Salads and Dressing Clinical staff conducted group or individual video education with verbal and written material and guidebook.  Patient learns that vegetables, fruits, whole grains, and legumes are the foundations of the Pritikin Eating Plan. Recommendations include how to incorporate each of these in flavorful and healthy salads, and how to create homemade salad dressings. Proper handling of ingredients is also covered. Cooking - Soups and [employer] - Soups and Desserts Clinical staff conducted group or individual video education with verbal and written material and guidebook.  Patient learns that Pritikin soups and desserts make for easy, nutritious, and delicious snacks and meal components that are low in sodium, fat, sugar,  and calorie density, while high in vitamins, minerals, and filling fiber. Recommendations include simple and healthy ideas for soups and desserts.   Overview     The Pritikin Solution Program Overview Clinical staff conducted group or individual video education with verbal and written material and guidebook.  Patient learns that the results of the Pritikin Program have been documented in more than 100 articles published in peer-reviewed  journals, and the benefits include reducing risk factors for (and, in some cases, even reversing) high cholesterol, high blood pressure, type 2 diabetes, obesity, and more! An overview of the three key pillars of the Pritikin Program will be covered: eating well, doing regular exercise, and having a healthy mind-set.  WORKSHOPS  Exercise: Exercise Basics: Building Your Action Plan Clinical staff led group instruction and group discussion with PowerPoint presentation and patient guidebook. To enhance the learning environment the use of posters, models and videos may be added. At the conclusion of this workshop, patients will comprehend the difference between physical activity and exercise, as well as the benefits of incorporating both, into their routine. Patients will understand the FITT (Frequency, Intensity, Time, and Type) principle and how to use it to build an exercise action plan. In addition, safety concerns and other considerations for exercise and cardiac rehab will be addressed by the presenter. The purpose of this lesson is to promote a comprehensive and effective weekly exercise routine in order to improve patients' overall level of fitness.   Managing Heart Disease: Your Path to a Healthier Heart Clinical staff led group instruction and group discussion with PowerPoint presentation and patient guidebook. To enhance the learning environment the use of posters, models and videos may be added.At the conclusion of this workshop, patients will understand the anatomy and physiology of the heart. Additionally, they will understand how Pritikin's three pillars impact the risk factors, the progression, and the management of heart disease.  The purpose of this lesson is to provide a high-level overview of the heart, heart disease, and how the Pritikin lifestyle positively impacts risk factors.  Exercise Biomechanics Clinical staff led group instruction and group discussion with PowerPoint  presentation and patient guidebook. To enhance the learning environment the use of posters, models and videos may be added. Patients will learn how the structural parts of their bodies function and how these functions impact their daily activities, movement, and exercise. Patients will learn how to promote a neutral spine, learn how to manage pain, and identify ways to improve their physical movement in order to promote healthy living. The purpose of this lesson is to expose patients to common physical limitations that impact physical activity. Participants will learn practical ways to adapt and manage aches and pains, and to minimize their effect on regular exercise. Patients will learn how to maintain good posture while sitting, walking, and lifting.  Balance Training and Fall Prevention  Clinical staff led group instruction and group discussion with PowerPoint presentation and patient guidebook. To enhance the learning environment the use of posters, models and videos may be added. At the conclusion of this workshop, patients will understand the importance of their sensorimotor skills (vision, proprioception, and the vestibular system) in maintaining their ability to balance as they age. Patients will apply a variety of balancing exercises that are appropriate for their current level of function. Patients will understand the common causes for poor balance, possible solutions to these problems, and ways to modify their physical environment in order to minimize their fall risk. The purpose of this lesson  is to teach patients about the importance of maintaining balance as they age and ways to minimize their risk of falling.  WORKSHOPS   Nutrition:  Fueling a Ship broker led group instruction and group discussion with PowerPoint presentation and patient guidebook. To enhance the learning environment the use of posters, models and videos may be added. Patients will review the  foundational principles of the Pritikin Eating Plan and understand what constitutes a serving size in each of the food groups. Patients will also learn Pritikin-friendly foods that are better choices when away from home and review make-ahead meal and snack options. Calorie density will be reviewed and applied to three nutrition priorities: weight maintenance, weight loss, and weight gain. The purpose of this lesson is to reinforce (in a group setting) the key concepts around what patients are recommended to eat and how to apply these guidelines when away from home by planning and selecting Pritikin-friendly options. Patients will understand how calorie density may be adjusted for different weight management goals.  Mindful Eating  Clinical staff led group instruction and group discussion with PowerPoint presentation and patient guidebook. To enhance the learning environment the use of posters, models and videos may be added. Patients will briefly review the concepts of the Pritikin Eating Plan and the importance of low-calorie dense foods. The concept of mindful eating will be introduced as well as the importance of paying attention to internal hunger signals. Triggers for non-hunger eating and techniques for dealing with triggers will be explored. The purpose of this lesson is to provide patients with the opportunity to review the basic principles of the Pritikin Eating Plan, discuss the value of eating mindfully and how to measure internal cues of hunger and fullness using the Hunger Scale. Patients will also discuss reasons for non-hunger eating and learn strategies to use for controlling emotional eating.  Targeting Your Nutrition Priorities Clinical staff led group instruction and group discussion with PowerPoint presentation and patient guidebook. To enhance the learning environment the use of posters, models and videos may be added. Patients will learn how to determine their genetic susceptibility to  disease by reviewing their family history. Patients will gain insight into the importance of diet as part of an overall healthy lifestyle in mitigating the impact of genetics and other environmental insults. The purpose of this lesson is to provide patients with the opportunity to assess their personal nutrition priorities by looking at their family history, their own health history and current risk factors. Patients will also be able to discuss ways of prioritizing and modifying the Pritikin Eating Plan for their highest risk areas  Menu  Clinical staff led group instruction and group discussion with PowerPoint presentation and patient guidebook. To enhance the learning environment the use of posters, models and videos may be added. Using menus brought in from [employer], or printed from [employer], patients will apply the Pritikin dining out guidelines that were presented in the [employer] video. Patients will also be able to practice these guidelines in a variety of provided scenarios. The purpose of this lesson is to provide patients with the opportunity to practice hands-on learning of the Pritikin Dining Out guidelines with actual menus and practice scenarios.  Label Reading Clinical staff led group instruction and group discussion with PowerPoint presentation and patient guidebook. To enhance the learning environment the use of posters, models and videos may be added. Patients will review and discuss the Pritikin label reading guidelines presented in Pritikin's Label  Reading Educational series video. Using fool labels brought in from local grocery stores and markets, patients will apply the label reading guidelines and determine if the packaged food meet the Pritikin guidelines. The purpose of this lesson is to provide patients with the opportunity to review, discuss, and practice hands-on learning of the Pritikin Label Reading guidelines with actual packaged food  labels. Cooking School  Pritikin's [employer] are designed to teach patients ways to prepare quick, simple, and affordable recipes at home. The importance of nutrition's role in chronic disease risk reduction is reflected in its emphasis in the overall Pritikin program. By learning how to prepare essential core Pritikin Eating Plan recipes, patients will increase control over what they eat; be able to customize the flavor of foods without the use of added salt, sugar, or fat; and improve the quality of the food they consume. By learning a set of core recipes which are easily assembled, quickly prepared, and affordable, patients are more likely to prepare more healthy foods at home. These workshops focus on convenient breakfasts, simple entres, side dishes, and desserts which can be prepared with minimal effort and are consistent with nutrition recommendations for cardiovascular risk reduction. Cooking [employer] are taught by a Armed forces logistics/support/administrative officer (RD) who has been trained by the [employer]. The chef or RD has a clear understanding of the importance of minimizing - if not completely eliminating - added fat, sugar, and sodium in recipes. Throughout the series of Cooking School Workshop sessions, patients will learn about healthy ingredients and efficient methods of cooking to build confidence in their capability to prepare    Cooking School weekly topics:  Adding Flavor- Sodium-Free  Fast and Healthy Breakfasts  Powerhouse Plant-Based Proteins  Satisfying Salads and Dressings  Simple Sides and Sauces  International Cuisine-Spotlight on the [employer] Zones  Delicious Desserts  Savory Soups  [employer] - Meals in a Astronomer Appetizers and Snacks  Comforting Weekend Breakfasts  One-Pot Wonders   Fast Evening Meals  Landscape architect Your Pritikin Plate  WORKSHOPS   Healthy Mindset (Psychosocial):  Focused Goals, Sustainable  Changes Clinical staff led group instruction and group discussion with PowerPoint presentation and patient guidebook. To enhance the learning environment the use of posters, models and videos may be added. Patients will be able to apply effective goal setting strategies to establish at least one personal goal, and then take consistent, meaningful action toward that goal. They will learn to identify common barriers to achieving personal goals and develop strategies to overcome them. Patients will also gain an understanding of how our mind-set can impact our ability to achieve goals and the importance of cultivating a positive and growth-oriented mind-set. The purpose of this lesson is to provide patients with a deeper understanding of how to set and achieve personal goals, as well as the tools and strategies needed to overcome common obstacles which may arise along the way.  From Head to Heart: The Power of a Healthy Outlook  Clinical staff led group instruction and group discussion with PowerPoint presentation and patient guidebook. To enhance the learning environment the use of posters, models and videos may be added. Patients will be able to recognize and describe the impact of emotions and mood on physical health. They will discover the importance of self-care and explore self-care practices which may work for them. Patients will also learn how to utilize the 4 C's to cultivate a healthier outlook and better manage stress  and challenges. The purpose of this lesson is to demonstrate to patients how a healthy outlook is an essential part of maintaining good health, especially as they continue their cardiac rehab journey.  Healthy Sleep for a Healthy Heart Clinical staff led group instruction and group discussion with PowerPoint presentation and patient guidebook. To enhance the learning environment the use of posters, models and videos may be added. At the conclusion of this workshop, patients will be able  to demonstrate knowledge of the importance of sleep to overall health, well-being, and quality of life. They will understand the symptoms of, and treatments for, common sleep disorders. Patients will also be able to identify daytime and nighttime behaviors which impact sleep, and they will be able to apply these tools to help manage sleep-related challenges. The purpose of this lesson is to provide patients with a general overview of sleep and outline the importance of quality sleep. Patients will learn about a few of the most common sleep disorders. Patients will also be introduced to the concept of "sleep hygiene," and discover ways to self-manage certain sleeping problems through simple daily behavior changes. Finally, the workshop will motivate patients by clarifying the links between quality sleep and their goals of heart-healthy living.   Recognizing and Reducing Stress Clinical staff led group instruction and group discussion with PowerPoint presentation and patient guidebook. To enhance the learning environment the use of posters, models and videos may be added. At the conclusion of this workshop, patients will be able to understand the types of stress reactions, differentiate between acute and chronic stress, and recognize the impact that chronic stress has on their health. They will also be able to apply different coping mechanisms, such as reframing negative self-talk. Patients will have the opportunity to practice a variety of stress management techniques, such as deep abdominal breathing, progressive muscle relaxation, and/or guided imagery.  The purpose of this lesson is to educate patients on the role of stress in their lives and to provide healthy techniques for coping with it.  Learning Barriers/Preferences:  Learning Barriers/Preferences - 02/01/23 1336       Learning Barriers/Preferences   Learning Barriers Sight    Learning Preferences Computer/Internet;Pictoral;Video              Education Topics:  Knowledge Questionnaire Score:  Knowledge Questionnaire Score - 02/01/23 1335       Knowledge Questionnaire Score   Pre Score 21/24             Core Components/Risk Factors/Patient Goals at Admission:  Personal Goals and Risk Factors at Admission - 02/01/23 1335       Core Components/Risk Factors/Patient Goals on Admission   Hypertension Yes    Intervention Provide education on lifestyle modifcations including regular physical activity/exercise, weight management, moderate sodium restriction and increased consumption of fresh fruit, vegetables, and low fat dairy, alcohol moderation, and smoking cessation.;Monitor prescription use compliance.    Expected Outcomes Short Term: Continued assessment and intervention until BP is < 140/45mm HG in hypertensive participants. < 130/77mm HG in hypertensive participants with diabetes, heart failure or chronic kidney disease.;Long Term: Maintenance of blood pressure at goal levels.    Lipids Yes    Intervention Provide education and support for participant on nutrition & aerobic/resistive exercise along with prescribed medications to achieve LDL 70mg , HDL >40mg .    Expected Outcomes Short Term: Participant states understanding of desired cholesterol values and is compliant with medications prescribed. Participant is following exercise prescription and nutrition guidelines.;Long Term: Cholesterol controlled  with medications as prescribed, with individualized exercise RX and with personalized nutrition plan. Value goals: LDL < 70mg , HDL > 40 mg.             Core Components/Risk Factors/Patient Goals Review:    Core Components/Risk Factors/Patient Goals at Discharge (Final Review):    ITP Comments:  ITP Comments     Row Name 02/01/23 1035           ITP Comments Armanda Magic, MD:  Medical Director.  Introduction to the [facility] / Intensive Cardiac Rehab.  Initial orientation packet reviewed  with the patient.                Comments: Participant attended orientation for the cardiac rehabilitation program on  02/01/2023  to perform initial intake and exercise walk test. Patient introduced to the Pritikin Program education and orientation packet was reviewed. Completed 6-minute walk test, measurements, initial ITP, and exercise prescription. Vital signs stable. Telemetry-normal sinus rhythm, asymptomatic.   Service time was from 10:05 to 11:46.

## 2023-02-01 NOTE — Progress Notes (Signed)
Cardiac Rehab Medication Review   Does the patient  feel that his/her medications are working for him/her?  yes  Has the patient been experiencing any side effects to the medications prescribed?  no  Does the patient measure his/her own blood pressure or blood glucose at home?  yes   Does the patient have any problems obtaining medications due to transportation or finances?   no  Understanding of regimen: excellent Understanding of indications: excellent Potential of compliance: good    Comments: Pt checks her blood pressure several times per day.    Lorin Picket 02/01/2023 1:48 PM

## 2023-02-07 ENCOUNTER — Encounter (HOSPITAL_COMMUNITY)
Admission: RE | Admit: 2023-02-07 | Discharge: 2023-02-07 | Disposition: A | Discharge status: Home / Self Care | Payer: Medicare Other | Source: Ambulatory Visit | Attending: Cardiology | Admitting: Cardiology

## 2023-02-07 DIAGNOSIS — Z955 Presence of coronary angioplasty implant and graft: Secondary | ICD-10-CM | POA: Insufficient documentation

## 2023-02-07 NOTE — Progress Notes (Signed)
Daily Session Note  Patient Details  Name: Amy Kerr MRN: [id number] Date of Birth: December 17, 1950 Referring Provider:   Flowsheet Row INTENSIVE CARDIAC REHAB ORIENT from 02/01/2023 in [facility]  Referring Provider Tessa Lerner, MD       Encounter Date: 02/07/2023  Check In:  Session Check In - 02/07/23 5284       Check-In   Supervising physician immediately available to respond to emergencies [facility] MD immediately available    Physician(s) Carlos Levering, NP    Location [facility]    Staff Present Lorin Picket, MS, ACSM-CEP, CCRP, Exercise Physiologist;Yasmine Kilbourne, RN, Zachery Conch, MS, ACSM-CEP, Exercise Physiologist;Johnny Hale Bogus, MS, Exercise Physiologist    Virtual Visit No    Medication changes reported     No    Fall or balance concerns reported    No    Tobacco Cessation No Change    Warm-up and Cool-down Performed as group-led instruction    Resistance Training Performed Yes    VAD Patient? No    PAD/SET Patient? No      Pain Assessment   Currently in Pain? No/denies    Pain Score 0-No pain    Multiple Pain Sites No             Capillary Blood Glucose: No results found for this or any previous visit (from the past 24 hour(s)).   Exercise Prescription Changes - 02/07/23 1100       Response to Exercise   Blood Pressure (Admit) 118/64    Blood Pressure (Exercise) 150/80    Blood Pressure (Exit) 110/70    Heart Rate (Admit) 77 bpm    Heart Rate (Exercise) 106 bpm    Heart Rate (Exit) 85 bpm    Rating of Perceived Exertion (Exercise) 11    Symptoms None    Comments Pt's first day in the [facility] program    Duration Continue with 30 min of aerobic exercise without signs/symptoms of physical distress.    Intensity THRR unchanged      Progression   Progression Continue to progress workloads to maintain intensity without signs/symptoms of physical distress.    Average  METs 2.57      Resistance Training   Training Prescription Yes    Weight 2 lbs    Reps 10-15    Time 10 Minutes      Interval Training   Interval Training No      NuStep   Level 2    SPM 75    Minutes 15    METs 2.1      Track   Laps 16    Minutes 15    METs 3.04             Social History   Tobacco Use  Smoking Status Never  Smokeless Tobacco Never    Goals Met:  Exercise tolerated well No report of concerns or symptoms today Strength training completed today  Goals Unmet:  Not Applicable  Comments: Pt started cardiac rehab today.  Pt tolerated light exercise without difficulty. VSS, telemetry-Sinus Rhythm, asymptomatic.  Medication list reconciled. Pt denies barriers to medicaiton compliance.  PSYCHOSOCIAL ASSESSMENT:  PHQ-2. Pt exhibits positive coping skills, hopeful outlook with supportive family. No psychosocial needs identified at this time, no psychosocial interventions necessary.    Pt enjoys  antiques,shopping, building bird houses and yard work.   Pt oriented to exercise equipment and routine.  Understanding verbalized. Harrell Gave RN BSN    Dr. Fransico Him is Medical Director for Cardiac Rehab at [facility].

## 2023-02-08 NOTE — Progress Notes (Signed)
Cardiac Individual Treatment Plan  Patient Details  Name: Amy Kerr MRN: [id number] Date of Birth: [birth year] Referring Provider:   Flowsheet Row INTENSIVE CARDIAC REHAB ORIENT from 02/01/2023 in [facility]  Referring Provider Tessa Lerner, MD       Initial Encounter Date:  Flowsheet Row INTENSIVE CARDIAC REHAB ORIENT from 02/01/2023 in [facility]  Date 02/01/23       Visit Diagnosis: 12/21/22 Status post coronary artery stent placement OM1  Patient's Home Medications on Admission:  Current Outpatient Medications:    amLODipine (NORVASC) 10 MG tablet, Take 1 tablet (10 mg total) by mouth every morning. (Patient taking differently: Take 10 mg by mouth daily.), Disp: 90 tablet, Rfl: 0   aspirin EC 81 MG tablet, Take 1 tablet (81 mg total) by mouth daily. Swallow whole., Disp: 90 tablet, Rfl: 3   atorvastatin (LIPITOR) 40 MG tablet, Take 1 tablet (40 mg total) by mouth daily., Disp: 90 tablet, Rfl: 0   Cholecalciferol (VITAMIN D-3) 25 MCG (1000 UT) CAPS, Take 2,000 Units by mouth daily., Disp: , Rfl:    metoprolol succinate (TOPROL XL) 25 MG 24 hr tablet, Take 1 tablet (25 mg total) by mouth every morning. Hold if systolic blood pressure (top number) less than 100 mmHg or pulse less than 60 bpm., Disp: 30 tablet, Rfl: 11   nitroGLYCERIN (NITROSTAT) 0.4 MG SL tablet, Place 1 tablet (0.4 mg total) under the tongue every 5 (five) minutes as needed for chest pain. If you require more than two tablets five minutes apart go to the nearest ER via EMS., Disp: 30 tablet, Rfl: 0   ticagrelor (BRILINTA) 90 MG TABS tablet, Take 1 tablet (90 mg total) by mouth 2 (two) times daily., Disp: 180 tablet, Rfl: 3  Past Medical History: Past Medical History:  Diagnosis Date   Coronary artery disease    Hyperlipidemia    Hypertension     Tobacco Use: Social History   Tobacco Use   Smoking Status Never  Smokeless Tobacco Never    Labs: Review Flowsheet        No data to display          Capillary Blood Glucose: No results found for: "GLUCAP"   Exercise Target Goals: Exercise Program Goal: Individual exercise prescription set using results from initial 6 min walk test and THRR while considering  patient's activity barriers and safety.   Exercise Prescription Goal: Initial exercise prescription builds to 30-45 minutes a day of aerobic activity, 2-3 days per week.  Home exercise guidelines will be given to patient during program as part of exercise prescription that the participant will acknowledge.  Activity Barriers & Risk Stratification:  Activity Barriers & Cardiac Risk Stratification - 02/01/23 1330       Activity Barriers & Cardiac Risk Stratification   Activity Barriers Neck/Spine Problems;Arthritis;Back Problems;Joint Problems;Deconditioning    Cardiac Risk Stratification High             6 Minute Walk:  6 Minute Walk     Row Name 02/01/23 1123         6 Minute Walk   Phase Initial     Distance 1164 feet     Walk Time 6 minutes     # of Rest Breaks 0     MPH 2.2     METS 2.42     RPE 8  Perceived Dyspnea  0     VO2 Peak 8.5     Symptoms No     Resting HR 70 bpm     Resting BP 118/78     Resting Oxygen Saturation  98 %     Exercise Oxygen Saturation  during 6 min walk 98 %     Max Ex. HR 94 bpm     Max Ex. BP 144/76     2 Minute Post BP 124/70              Oxygen Initial Assessment:   Oxygen Re-Evaluation:   Oxygen Discharge (Final Oxygen Re-Evaluation):   Initial Exercise Prescription:  Initial Exercise Prescription - 02/01/23 1300       Date of Initial Exercise RX and Referring Provider   Date 02/01/23    Referring Provider Tessa Lerner, MD    Expected Discharge Date 04/15/23      NuStep   Level 2    SPM 75    Minutes 15    METs 2.4      Track   Laps 8    Minutes 15    METs 2.4       Prescription Details   Frequency (times per week) 3    Duration Progress to 30 minutes of continuous aerobic without signs/symptoms of physical distress      Intensity   THRR 40-80% of Max Heartrate 59-118    Ratings of Perceived Exertion 11-13    Perceived Dyspnea 0-4      Progression   Progression Continue progressive overload as per policy without signs/symptoms or physical distress.      Resistance Training   Training Prescription Yes    Weight 2 lbs    Reps 10-15             Perform Capillary Blood Glucose checks as needed.  Exercise Prescription Changes:   Exercise Prescription Changes     Row Name 02/07/23 1100             Response to Exercise   Blood Pressure (Admit) 118/64       Blood Pressure (Exercise) 150/80       Blood Pressure (Exit) 110/70       Heart Rate (Admit) 77 bpm       Heart Rate (Exercise) 106 bpm       Heart Rate (Exit) 85 bpm       Rating of Perceived Exertion (Exercise) 11       Symptoms None       Comments Pt's first day in the CRP2 program       Duration Continue with 30 min of aerobic exercise without signs/symptoms of physical distress.       Intensity THRR unchanged         Progression   Progression Continue to progress workloads to maintain intensity without signs/symptoms of physical distress.       Average METs 2.57         Resistance Training   Training Prescription Yes       Weight 2 lbs       Reps 10-15       Time 10 Minutes         Interval Training   Interval Training No         NuStep   Level 2       SPM 75       Minutes 15       METs 2.1  Track   Laps 16       Minutes 15       METs 3.04                Exercise Comments:   Exercise Comments     Row Name 02/07/23 1109           Exercise Comments Pt's first day in the CRP2 program. Pt exercised without complaint and is off to a good start.                Exercise Goals and Review:   Exercise Goals     Row Name 02/01/23 1331              Exercise Goals   Increase Physical Activity Yes       Intervention Provide advice, education, support and counseling about physical activity/exercise needs.;Develop an individualized exercise prescription for aerobic and resistive training based on initial evaluation findings, risk stratification, comorbidities and participant's personal goals.       Expected Outcomes Short Term: Attend rehab on a regular basis to increase amount of physical activity.;Long Term: Add in home exercise to make exercise part of routine and to increase amount of physical activity.;Long Term: Exercising regularly at least 3-5 days a week.       Increase Strength and Stamina Yes       Intervention Provide advice, education, support and counseling about physical activity/exercise needs.;Develop an individualized exercise prescription for aerobic and resistive training based on initial evaluation findings, risk stratification, comorbidities and participant's personal goals.       Expected Outcomes Short Term: Increase workloads from initial exercise prescription for resistance, speed, and METs.;Short Term: Perform resistance training exercises routinely during rehab and add in resistance training at home;Long Term: Improve cardiorespiratory fitness, muscular endurance and strength as measured by increased METs and functional capacity ( )       Able to understand and use rate of perceived exertion (RPE) scale Yes       Intervention Provide education and explanation on how to use RPE scale       Expected Outcomes Short Term: Able to use RPE daily in rehab to express subjective intensity level;Long Term:  Able to use RPE to guide intensity level when exercising independently       Knowledge and understanding of Target Heart Rate Range (THRR) Yes       Intervention Provide education and explanation of THRR including how the numbers were predicted and where they are located for reference       Expected Outcomes  Short Term: Able to state/look up THRR;Long Term: Able to use THRR to govern intensity when exercising independently;Short Term: Able to use daily as guideline for intensity in rehab       Understanding of Exercise Prescription Yes       Intervention Provide education, explanation, and written materials on patient's individual exercise prescription       Expected Outcomes Short Term: Able to explain program exercise prescription;Long Term: Able to explain home exercise prescription to exercise independently                Exercise Goals Re-Evaluation :  Exercise Goals Re-Evaluation     Row Name 02/07/23 1107             Exercise Goal Re-Evaluation   Exercise Goals Review Increase Physical Activity;Increase Strength and Stamina;Able to understand and use rate of perceived exertion (RPE) scale;Knowledge and understanding of Target Heart Rate Range (  THRR);Understanding of Exercise Prescription       Comments Pt's first day in the CRP2 program. Pt understands the exercise Rx, RPE scale, and THRR.       Expected Outcomes Will continue to monitor patient and progress exercise workloads as tolerated.                Discharge Exercise Prescription (Final Exercise Prescription Changes):  Exercise Prescription Changes - 02/07/23 1100       Response to Exercise   Blood Pressure (Admit) 118/64    Blood Pressure (Exercise) 150/80    Blood Pressure (Exit) 110/70    Heart Rate (Admit) 77 bpm    Heart Rate (Exercise) 106 bpm    Heart Rate (Exit) 85 bpm    Rating of Perceived Exertion (Exercise) 11    Symptoms None    Comments Pt's first day in the CRP2 program    Duration Continue with 30 min of aerobic exercise without signs/symptoms of physical distress.    Intensity THRR unchanged      Progression   Progression Continue to progress workloads to maintain intensity without signs/symptoms of physical distress.    Average METs 2.57      Resistance Training   Training Prescription  Yes    Weight 2 lbs    Reps 10-15    Time 10 Minutes      Interval Training   Interval Training No      NuStep   Level 2    SPM 75    Minutes 15    METs 2.1      Track   Laps 16    Minutes 15    METs 3.04             Nutrition:  Target Goals: Understanding of nutrition guidelines, daily intake of sodium 1500mg , cholesterol 200mg , calories 30% from fat and 7% or less from saturated fats, daily to have 5 or more servings of fruits and vegetables.  Biometrics:  Pre Biometrics - 02/01/23 1024       Pre Biometrics   Waist Circumference 37.75 inches    Hip Circumference 44 inches    Waist to Hip Ratio 0.86 %    Triceps Skinfold 30 mm    % Body Fat 41.7 %    Grip Strength 33 kg    Flexibility 17.5 in    Single Leg Stand 30 seconds              Nutrition Therapy Plan and Nutrition Goals:  Nutrition Therapy & Goals - 02/07/23 0847       Nutrition Therapy   Diet heart healthy diet    Drug/Food Interactions Statins/Certain Fruits      Personal Nutrition Goals   Nutrition Goal Patient to identify strategies for reducing cardiovascular risk by attending the Pritikin education and nutrition series weekly.    Personal Goal #2 Patient to improve diet quality by using the plate method as a guide for meal planning to include lean protein/plant protein, fruits, vegetables, whole grains, nonfat dairy as part of a well-balanced diet.    Personal Goal #3 Patient to limit sodium intake 1500mg  per day    Comments Amy Kerr reports that she has started making many dietary changes including reduced sodium and reading food labels. Her husband is very supportive as well. Patient will benefit from participation in intensive cardiac rehab for nutrition, exercise, and lifestyle modification.      Intervention Plan   Intervention Prescribe, educate and counsel regarding  individualized specific dietary modifications aiming towards targeted core components such as weight,  hypertension, lipid management, diabetes, heart failure and other comorbidities.;Nutrition handout(s) given to patient.    Expected Outcomes Short Term Goal: Understand basic principles of dietary content, such as calories, fat, sodium, cholesterol and nutrients.;Long Term Goal: Adherence to prescribed nutrition plan.             Nutrition Assessments:  Nutrition Assessments - 02/07/23 0855       Rate Your Plate Scores   Pre Score 76            MEDIFICTS Score Key: ?70 Need to make dietary changes  40-70 Heart Healthy Diet ? 40 Therapeutic Level Cholesterol Diet   Flowsheet Row INTENSIVE CARDIAC REHAB from 02/07/2023 in [facility]  Picture Your Plate Total Score on Admission 76      Picture Your Plate Scores: <16 Unhealthy dietary pattern with much room for improvement. 41-50 Dietary pattern unlikely to meet recommendations for good health and room for improvement. 51-60 More healthful dietary pattern, with some room for improvement.  >60 Healthy dietary pattern, although there may be some specific behaviors that could be improved.    Nutrition Goals Re-Evaluation:  Nutrition Goals Re-Evaluation     Row Name 02/07/23 0847             Goals   Current Weight 176 lb 5.9 oz (80 kg)       Comment unable to view lipid panel from outside provider. Cr. 1.44, GFR 39       Expected Outcome Amy Kerr reports that she has started making many dietary changes including reduced sodium and reading food labels. Her husband is very supportive as well. Patient will benefit from participation in intensive cardiac rehab for nutrition, exercise, and lifestyle modification.                Nutrition Goals Re-Evaluation:  Nutrition Goals Re-Evaluation     Row Name 02/07/23 0847             Goals   Current Weight 176 lb 5.9 oz (80 kg)       Comment unable to view lipid panel from outside provider. Cr. 1.44, GFR 39        Expected Outcome Amy Kerr reports that she has started making many dietary changes including reduced sodium and reading food labels. Her husband is very supportive as well. Patient will benefit from participation in intensive cardiac rehab for nutrition, exercise, and lifestyle modification.                Nutrition Goals Discharge (Final Nutrition Goals Re-Evaluation):  Nutrition Goals Re-Evaluation - 02/07/23 0847       Goals   Current Weight 176 lb 5.9 oz (80 kg)    Comment unable to view lipid panel from outside provider. Cr. 1.44, GFR 39    Expected Outcome Amy Kerr reports that she has started making many dietary changes including reduced sodium and reading food labels. Her husband is very supportive as well. Patient will benefit from participation in intensive cardiac rehab for nutrition, exercise, and lifestyle modification.             Psychosocial: Target Goals: Acknowledge presence or absence of significant depression and/or stress, maximize coping skills, provide positive support system. Participant is able to verbalize types and ability to use techniques and skills needed for reducing stress and depression.  Initial Review & Psychosocial Screening:  Initial  Psych Review & Screening - 02/01/23 1042       Initial Review   Current issues with None Identified      Family Dynamics   Good Support System? Yes    Comments Has spouse, 3 children, and several grandchildren for support      Barriers   Psychosocial barriers to participate in program There are no identifiable barriers or psychosocial needs.      Screening Interventions   Interventions Encouraged to exercise             Quality of Life Scores:  Quality of Life - 02/01/23 1336       Quality of Life   Select Quality of Life      Quality of Life Scores   Health/Function Pre 29.1 %    Socioeconomic Pre 30 %    Psych/Spiritual Pre 30 %    Family Pre 30 %    GLOBAL Pre 29.6 %             Scores of 19 and below usually indicate a poorer quality of life in these areas.  A difference of  2-3 points is a clinically meaningful difference.  A difference of 2-3 points in the total score of the Quality of Life Index has been associated with significant improvement in overall quality of life, self-image, physical symptoms, and general health in studies assessing change in quality of life.  PHQ-9: Review Flowsheet       02/01/2023  Depression screen PHQ 2/9  Decreased Interest 0  Down, Depressed, Hopeless 0  PHQ - 2 Score 0  Altered sleeping 1  Tired, decreased energy 1  Change in appetite 0  Feeling bad or failure about yourself  0  Trouble concentrating 0  Moving slowly or fidgety/restless 0  Suicidal thoughts 0  PHQ-9 Score 2  Difficult doing work/chores Not difficult at all   Interpretation of Total Score  Total Score Depression Severity:  1-4 = Minimal depression, 5-9 = Mild depression, 10-14 = Moderate depression, 15-19 = Moderately severe depression, 20-27 = Severe depression   Psychosocial Evaluation and Intervention:   Psychosocial Re-Evaluation:  Psychosocial Re-Evaluation     Row Name 02/08/23 0910             Psychosocial Re-Evaluation   Current issues with None Identified       Interventions Encouraged to attend Cardiac Rehabilitation for the exercise       Continue Psychosocial Services  No Follow up required                Psychosocial Discharge (Final Psychosocial Re-Evaluation):  Psychosocial Re-Evaluation - 02/08/23 0910       Psychosocial Re-Evaluation   Current issues with None Identified    Interventions Encouraged to attend Cardiac Rehabilitation for the exercise    Continue Psychosocial Services  No Follow up required             Vocational Rehabilitation: Provide vocational rehab assistance to qualifying candidates.   Vocational Rehab Evaluation & Intervention:  Vocational Rehab - 02/01/23 1041       Initial  Vocational Rehab Evaluation & Intervention   Assessment shows need for Vocational Rehabilitation No   Pt is retired            Education: Education Goals: Education classes will be provided on a weekly basis, covering required topics. Participant will state understanding/return demonstration of topics presented.    Education     Row Name 02/07/23 1100  Education   Cardiac Education Topics Pritikin   Geographical information systems officer Psychosocial   Psychosocial Workshop Healthy Sleep for a Healthy Heart   Instruction Review Code 1- Verbalizes Understanding   Class Start Time 0815   Class Stop Time 0856   Class Time Calculation (min) 41 min            Core Videos: Exercise    Move It!  Clinical staff conducted group or individual video education with verbal and written material and guidebook.  Patient learns the recommended Pritikin exercise program. Exercise with the goal of living a long, healthy life. Some of the health benefits of exercise include controlled diabetes, healthier blood pressure levels, improved cholesterol levels, improved heart and lung capacity, improved sleep, and better body composition. Everyone should speak with their doctor before starting or changing an exercise routine.  Biomechanical Limitations Clinical staff conducted group or individual video education with verbal and written material and guidebook.  Patient learns how biomechanical limitations can impact exercise and how we can mitigate and possibly overcome limitations to have an impactful and balanced exercise routine.  Body Composition Clinical staff conducted group or individual video education with verbal and written material and guidebook.  Patient learns that body composition (ratio of muscle mass to fat mass) is a key component to assessing overall fitness, rather than body weight alone. Increased fat mass, especially visceral belly fat,  can put [place] at increased risk for metabolic syndrome, type 2 diabetes, heart disease, and even death. It is recommended to combine diet and exercise (cardiovascular and resistance training) to improve your body composition. Seek guidance from your physician and exercise physiologist before implementing an exercise routine.  Exercise Action Plan Clinical staff conducted group or individual video education with verbal and written material and guidebook.  Patient learns the recommended strategies to achieve and enjoy long-term exercise adherence, including variety, self-motivation, self-efficacy, and positive decision making. Benefits of exercise include fitness, good health, weight management, more energy, better sleep, less stress, and overall well-being.  Medical   Heart Disease Risk Reduction Clinical staff conducted group or individual video education with verbal and written material and guidebook.  Patient learns our heart is our most vital organ as it circulates oxygen, nutrients, white blood cells, and hormones throughout the entire body, and carries waste away. Data supports a plant-based eating plan like the Pritikin Program for its effectiveness in slowing progression of and reversing heart disease. The video provides a number of recommendations to address heart disease.   Metabolic Syndrome and Belly Fat  Clinical staff conducted group or individual video education with verbal and written material and guidebook.  Patient learns what metabolic syndrome is, how it leads to heart disease, and how one can reverse it and keep it from coming back. You have metabolic syndrome if you have 3 of the following 5 criteria: abdominal obesity, high blood pressure, high triglycerides, low HDL cholesterol, and high blood sugar.  Hypertension and Heart Disease Clinical staff conducted group or individual video education with verbal and written material and guidebook.  Patient learns that high blood pressure,  or hypertension, is very common in the [place]. Hypertension is largely due to excessive salt intake, but other important risk factors include being overweight, physical inactivity, drinking too much alcohol, smoking, and not eating enough potassium from fruits and vegetables. High blood pressure is a leading risk factor for heart attack, stroke, congestive heart failure,  dementia, kidney failure, and premature death. Long-term effects of excessive salt intake include stiffening of the arteries and thickening of heart muscle and organ damage. Recommendations include ways to reduce hypertension and the risk of heart disease.  Diseases of Our Time - Focusing on Diabetes Clinical staff conducted group or individual video education with verbal and written material and guidebook.  Patient learns why the best way to stop diseases of our time is prevention, through food and other lifestyle changes. Medicine (such as prescription pills and surgeries) is often only a Band-Aid on the problem, not a long-term solution. Most common diseases of our time include obesity, type 2 diabetes, hypertension, heart disease, and cancer. The Pritikin Program is recommended and has been proven to help reduce, reverse, and/or prevent the damaging effects of metabolic syndrome.  Nutrition   Overview of the Pritikin Eating Plan  Clinical staff conducted group or individual video education with verbal and written material and guidebook.  Patient learns about the Pritikin Eating Plan for disease risk reduction. The Pritikin Eating Plan emphasizes a wide variety of unrefined, minimally-processed carbohydrates, like fruits, vegetables, whole grains, and legumes. Go, Caution, and Stop food choices are explained. Plant-based and lean animal proteins are emphasized. Rationale provided for low sodium intake for blood pressure control, low added sugars for blood sugar stabilization, and low added fats and oils for coronary artery disease  risk reduction and weight management.  Calorie Density  Clinical staff conducted group or individual video education with verbal and written material and guidebook.  Patient learns about calorie density and how it impacts the Pritikin Eating Plan. Knowing the characteristics of the food you choose will help you decide whether those foods will lead to weight gain or weight loss, and whether you want to consume more or less of them. Weight loss is usually a side effect of the Pritikin Eating Plan because of its focus on low calorie-dense foods.  Label Reading  Clinical staff conducted group or individual video education with verbal and written material and guidebook.  Patient learns about the Pritikin recommended label reading guidelines and corresponding recommendations regarding calorie density, added sugars, sodium content, and whole grains.  Dining Out - Part 1  Clinical staff conducted group or individual video education with verbal and written material and guidebook.  Patient learns that restaurant meals can be sabotaging because they can be so high in calories, fat, sodium, and/or sugar. Patient learns recommended strategies on how to positively address this and avoid unhealthy pitfalls.  Facts on Fats  Clinical staff conducted group or individual video education with verbal and written material and guidebook.  Patient learns that lifestyle modifications can be just as effective, if not more so, as many medications for lowering your risk of heart disease. A Pritikin lifestyle can help to reduce your risk of inflammation and atherosclerosis (cholesterol build-up, or plaque, in the artery walls). Lifestyle interventions such as dietary choices and physical activity address the cause of atherosclerosis. A review of the types of fats and their impact on blood cholesterol levels, along with dietary recommendations to reduce fat intake is also included.  Nutrition Action Plan  Clinical staff  conducted group or individual video education with verbal and written material and guidebook.  Patient learns how to incorporate Pritikin recommendations into their lifestyle. Recommendations include planning and keeping personal health goals in mind as an important part of their success.  Healthy Mind-Set    Healthy Minds, Bodies, Hearts  Clinical staff conducted group or individual  video education with verbal and written material and guidebook.  Patient learns how to identify when they are stressed. Video will discuss the impact of that stress, as well as the many benefits of stress management. Patient will also be introduced to stress management techniques. The way we think, act, and feel has an impact on our hearts.  How Our Thoughts Can Heal Our Hearts  Clinical staff conducted group or individual video education with verbal and written material and guidebook.  Patient learns that negative thoughts can cause depression and anxiety. This can result in negative lifestyle behavior and serious health problems. Cognitive behavioral therapy is an effective method to help control our thoughts in order to change and improve our emotional outlook.  Additional Videos:  Exercise    Improving Performance  Clinical staff conducted group or individual video education with verbal and written material and guidebook.  Patient learns to use a non-linear approach by alternating intensity levels and lengths of time spent exercising to help burn more calories and lose more body fat. Cardiovascular exercise helps improve heart health, metabolism, hormonal balance, blood sugar control, and recovery from fatigue. Resistance training improves strength, endurance, balance, coordination, reaction time, metabolism, and muscle mass. Flexibility exercise improves circulation, posture, and balance. Seek guidance from your physician and exercise physiologist before implementing an exercise routine and learn your capabilities  and proper form for all exercise.  Introduction to Yoga  Clinical staff conducted group or individual video education with verbal and written material and guidebook.  Patient learns about yoga, a discipline of the coming together of mind, breath, and body. The benefits of yoga include improved flexibility, improved range of motion, better posture and core strength, increased lung function, weight loss, and positive self-image. Yoga's heart health benefits include lowered blood pressure, healthier heart rate, decreased cholesterol and triglyceride levels, improved immune function, and reduced stress. Seek guidance from your physician and exercise physiologist before implementing an exercise routine and learn your capabilities and proper form for all exercise.  Medical   Aging: Enhancing Your Quality of Life  Clinical staff conducted group or individual video education with verbal and written material and guidebook.  Patient learns key strategies and recommendations to stay in good physical health and enhance quality of life, such as prevention strategies, having an advocate, securing a Health Care Proxy and Power of Attorney, and keeping a list of medications and system for tracking them. It also discusses how to avoid risk for bone loss.  Biology of Weight Control  Clinical staff conducted group or individual video education with verbal and written material and guidebook.  Patient learns that weight gain occurs because we consume more calories than we burn (eating more, moving less). Even if your body weight is normal, you may have higher ratios of fat compared to muscle mass. Too much body fat puts you at increased risk for cardiovascular disease, heart attack, stroke, type 2 diabetes, and obesity-related cancers. In addition to exercise, following the Pritikin Eating Plan can help reduce your risk.  Decoding Lab Results  Clinical staff conducted group or individual video education with verbal and  written material and guidebook.  Patient learns that lab test reflects one measurement whose values change over time and are influenced by many factors, including medication, stress, sleep, exercise, food, hydration, pre-existing medical conditions, and more. It is recommended to use the knowledge from this video to become more involved with your lab results and evaluate your numbers to speak with your doctor.  Diseases of Our Time - Overview  Clinical staff conducted group or individual video education with verbal and written material and guidebook.  Patient learns that according to the CDC, 50% to 70% of chronic diseases (such as obesity, type 2 diabetes, elevated lipids, hypertension, and heart disease) are avoidable through lifestyle improvements including healthier food choices, listening to satiety cues, and increased physical activity.  Sleep Disorders Clinical staff conducted group or individual video education with verbal and written material and guidebook.  Patient learns how good quality and duration of sleep are important to overall health and well-being. Patient also learns about sleep disorders and how they impact health along with recommendations to address them, including discussing with a physician.  Nutrition  Dining Out - Part 2 Clinical staff conducted group or individual video education with verbal and written material and guidebook.  Patient learns how to plan ahead and communicate in order to maximize their dining experience in a healthy and nutritious manner. Included are recommended food choices based on the type of restaurant the patient is visiting.   Fueling a Banker conducted group or individual video education with verbal and written material and guidebook.  There is a strong connection between our food choices and our health. Diseases like obesity and type 2 diabetes are very prevalent and are in large-part due to lifestyle choices. The  Pritikin Eating Plan provides plenty of food and hunger-curbing satisfaction. It is easy to follow, affordable, and helps reduce health risks.  Menu Workshop  Clinical staff conducted group or individual video education with verbal and written material and guidebook.  Patient learns that restaurant meals can sabotage health goals because they are often packed with calories, fat, sodium, and sugar. Recommendations include strategies to plan ahead and to communicate with the manager, chef, or server to help order a healthier meal.  Planning Your Eating Strategy  Clinical staff conducted group or individual video education with verbal and written material and guidebook.  Patient learns about the Pritikin Eating Plan and its benefit of reducing the risk of disease. The Pritikin Eating Plan does not focus on calories. Instead, it emphasizes high-quality, nutrient-rich foods. By knowing the characteristics of the foods, we choose, we can determine their calorie density and make informed decisions.  Targeting Your Nutrition Priorities  Clinical staff conducted group or individual video education with verbal and written material and guidebook.  Patient learns that lifestyle habits have a tremendous impact on disease risk and progression. This video provides eating and physical activity recommendations based on your personal health goals, such as reducing LDL cholesterol, losing weight, preventing or controlling type 2 diabetes, and reducing high blood pressure.  Vitamins and Minerals  Clinical staff conducted group or individual video education with verbal and written material and guidebook.  Patient learns different ways to obtain key vitamins and minerals, including through a recommended healthy diet. It is important to discuss all supplements you take with your doctor.   Healthy Mind-Set    Smoking Cessation  Clinical staff conducted group or individual video education with verbal and written  material and guidebook.  Patient learns that cigarette smoking and tobacco addiction pose a serious health risk which affects millions of people. Stopping smoking will significantly reduce the risk of heart disease, lung disease, and many forms of cancer. Recommended strategies for quitting are covered, including working with your doctor to develop a successful plan.  Culinary   Becoming a Set designer conducted group or  individual video education with verbal and written material and guidebook.  Patient learns that cooking at home can be healthy, cost-effective, quick, and puts them in control. Keys to cooking healthy recipes will include looking at your recipe, assessing your equipment needs, planning ahead, making it simple, choosing cost-effective seasonal ingredients, and limiting the use of added fats, salts, and sugars.  Cooking - Breakfast and Snacks  Clinical staff conducted group or individual video education with verbal and written material and guidebook.  Patient learns how important breakfast is to satiety and nutrition through the entire day. Recommendations include key foods to eat during breakfast to help stabilize blood sugar levels and to prevent overeating at meals later in the day. Planning ahead is also a key component.  Cooking - Educational psychologist conducted group or individual video education with verbal and written material and guidebook.  Patient learns eating strategies to improve overall health, including an approach to cook more at home. Recommendations include thinking of animal protein as a side on your plate rather than center stage and focusing instead on lower calorie dense options like vegetables, fruits, whole grains, and plant-based proteins, such as beans. Making sauces in large quantities to freeze for later and leaving the skin on your vegetables are also recommended to maximize your experience.  Cooking - Healthy Salads and  Dressing Clinical staff conducted group or individual video education with verbal and written material and guidebook.  Patient learns that vegetables, fruits, whole grains, and legumes are the foundations of the Pritikin Eating Plan. Recommendations include how to incorporate each of these in flavorful and healthy salads, and how to create homemade salad dressings. Proper handling of ingredients is also covered. Cooking - Soups and [employer] - Soups and Desserts Clinical staff conducted group or individual video education with verbal and written material and guidebook.  Patient learns that Pritikin soups and desserts make for easy, nutritious, and delicious snacks and meal components that are low in sodium, fat, sugar, and calorie density, while high in vitamins, minerals, and filling fiber. Recommendations include simple and healthy ideas for soups and desserts.   Overview     The Pritikin Solution Program Overview Clinical staff conducted group or individual video education with verbal and written material and guidebook.  Patient learns that the results of the Pritikin Program have been documented in more than 100 articles published in peer-reviewed journals, and the benefits include reducing risk factors for (and, in some cases, even reversing) high cholesterol, high blood pressure, type 2 diabetes, obesity, and more! An overview of the three key pillars of the Pritikin Program will be covered: eating well, doing regular exercise, and having a healthy mind-set.  WORKSHOPS  Exercise: Exercise Basics: Building Your Action Plan Clinical staff led group instruction and group discussion with PowerPoint presentation and patient guidebook. To enhance the learning environment the use of posters, models and videos may be added. At the conclusion of this workshop, patients will comprehend the difference between physical activity and exercise, as well as the benefits of incorporating both, into  their routine. Patients will understand the FITT (Frequency, Intensity, Time, and Type) principle and how to use it to build an exercise action plan. In addition, safety concerns and other considerations for exercise and cardiac rehab will be addressed by the presenter. The purpose of this lesson is to promote a comprehensive and effective weekly exercise routine in order to improve patients' overall level of fitness.   Managing Heart Disease:  Your Path to a Healthier Heart Clinical staff led group instruction and group discussion with PowerPoint presentation and patient guidebook. To enhance the learning environment the use of posters, models and videos may be added.At the conclusion of this workshop, patients will understand the anatomy and physiology of the heart. Additionally, they will understand how Pritikin's three pillars impact the risk factors, the progression, and the management of heart disease.  The purpose of this lesson is to provide a high-level overview of the heart, heart disease, and how the Pritikin lifestyle positively impacts risk factors.  Exercise Biomechanics Clinical staff led group instruction and group discussion with PowerPoint presentation and patient guidebook. To enhance the learning environment the use of posters, models and videos may be added. Patients will learn how the structural parts of their bodies function and how these functions impact their daily activities, movement, and exercise. Patients will learn how to promote a neutral spine, learn how to manage pain, and identify ways to improve their physical movement in order to promote healthy living. The purpose of this lesson is to expose patients to common physical limitations that impact physical activity. Participants will learn practical ways to adapt and manage aches and pains, and to minimize their effect on regular exercise. Patients will learn how to maintain good posture while sitting, walking, and  lifting.  Balance Training and Fall Prevention  Clinical staff led group instruction and group discussion with PowerPoint presentation and patient guidebook. To enhance the learning environment the use of posters, models and videos may be added. At the conclusion of this workshop, patients will understand the importance of their sensorimotor skills (vision, proprioception, and the vestibular system) in maintaining their ability to balance as they age. Patients will apply a variety of balancing exercises that are appropriate for their current level of function. Patients will understand the common causes for poor balance, possible solutions to these problems, and ways to modify their physical environment in order to minimize their fall risk. The purpose of this lesson is to teach patients about the importance of maintaining balance as they age and ways to minimize their risk of falling.  WORKSHOPS   Nutrition:  Fueling a Ship broker led group instruction and group discussion with PowerPoint presentation and patient guidebook. To enhance the learning environment the use of posters, models and videos may be added. Patients will review the foundational principles of the Pritikin Eating Plan and understand what constitutes a serving size in each of the food groups. Patients will also learn Pritikin-friendly foods that are better choices when away from home and review make-ahead meal and snack options. Calorie density will be reviewed and applied to three nutrition priorities: weight maintenance, weight loss, and weight gain. The purpose of this lesson is to reinforce (in a group setting) the key concepts around what patients are recommended to eat and how to apply these guidelines when away from home by planning and selecting Pritikin-friendly options. Patients will understand how calorie density may be adjusted for different weight management goals.  Mindful Eating  Clinical staff led  group instruction and group discussion with PowerPoint presentation and patient guidebook. To enhance the learning environment the use of posters, models and videos may be added. Patients will briefly review the concepts of the Pritikin Eating Plan and the importance of low-calorie dense foods. The concept of mindful eating will be introduced as well as the importance of paying attention to internal hunger signals. Triggers for non-hunger eating and techniques for dealing  with triggers will be explored. The purpose of this lesson is to provide patients with the opportunity to review the basic principles of the Pritikin Eating Plan, discuss the value of eating mindfully and how to measure internal cues of hunger and fullness using the Hunger Scale. Patients will also discuss reasons for non-hunger eating and learn strategies to use for controlling emotional eating.  Targeting Your Nutrition Priorities Clinical staff led group instruction and group discussion with PowerPoint presentation and patient guidebook. To enhance the learning environment the use of posters, models and videos may be added. Patients will learn how to determine their genetic susceptibility to disease by reviewing their family history. Patients will gain insight into the importance of diet as part of an overall healthy lifestyle in mitigating the impact of genetics and other environmental insults. The purpose of this lesson is to provide patients with the opportunity to assess their personal nutrition priorities by looking at their family history, their own health history and current risk factors. Patients will also be able to discuss ways of prioritizing and modifying the Pritikin Eating Plan for their highest risk areas  Menu  Clinical staff led group instruction and group discussion with PowerPoint presentation and patient guidebook. To enhance the learning environment the use of posters, models and videos may be added. Using menus  brought in from [employer], or printed from [employer], patients will apply the Pritikin dining out guidelines that were presented in the [employer] video. Patients will also be able to practice these guidelines in a variety of provided scenarios. The purpose of this lesson is to provide patients with the opportunity to practice hands-on learning of the Pritikin Dining Out guidelines with actual menus and practice scenarios.  Label Reading Clinical staff led group instruction and group discussion with PowerPoint presentation and patient guidebook. To enhance the learning environment the use of posters, models and videos may be added. Patients will review and discuss the Pritikin label reading guidelines presented in Pritikin's Label Reading Educational series video. Using fool labels brought in from local grocery stores and markets, patients will apply the label reading guidelines and determine if the packaged food meet the Pritikin guidelines. The purpose of this lesson is to provide patients with the opportunity to review, discuss, and practice hands-on learning of the Pritikin Label Reading guidelines with actual packaged food labels. Cooking School  Pritikin's [employer] are designed to teach patients ways to prepare quick, simple, and affordable recipes at home. The importance of nutrition's role in chronic disease risk reduction is reflected in its emphasis in the overall Pritikin program. By learning how to prepare essential core Pritikin Eating Plan recipes, patients will increase control over what they eat; be able to customize the flavor of foods without the use of added salt, sugar, or fat; and improve the quality of the food they consume. By learning a set of core recipes which are easily assembled, quickly prepared, and affordable, patients are more likely to prepare more healthy foods at home. These workshops focus on convenient breakfasts, simple  entres, side dishes, and desserts which can be prepared with minimal effort and are consistent with nutrition recommendations for cardiovascular risk reduction. Cooking [employer] are taught by a Armed forces logistics/support/administrative officer (RD) who has been trained by the [employer]. The chef or RD has a clear understanding of the importance of minimizing - if not completely eliminating - added fat, sugar, and sodium in recipes. Throughout the  series of Cooking School Workshop sessions, patients will learn about healthy ingredients and efficient methods of cooking to build confidence in their capability to prepare    Cooking School weekly topics:  Adding Flavor- Sodium-Free  Fast and Healthy Breakfasts  Powerhouse Plant-Based Proteins  Satisfying Salads and Dressings  Simple Sides and Sauces  International Cuisine-Spotlight on the [employer] Zones  Delicious Desserts  Savory Soups  [employer] - Meals in a Astronomer Appetizers and Snacks  Comforting Weekend Breakfasts  One-Pot Wonders   Fast Evening Meals  Landscape architect Your Pritikin Plate  WORKSHOPS   Healthy Mindset (Psychosocial):  Focused Goals, Sustainable Changes Clinical staff led group instruction and group discussion with PowerPoint presentation and patient guidebook. To enhance the learning environment the use of posters, models and videos may be added. Patients will be able to apply effective goal setting strategies to establish at least one personal goal, and then take consistent, meaningful action toward that goal. They will learn to identify common barriers to achieving personal goals and develop strategies to overcome them. Patients will also gain an understanding of how our mind-set can impact our ability to achieve goals and the importance of cultivating a positive and growth-oriented mind-set. The purpose of this lesson is to provide patients with a deeper understanding of how to set and  achieve personal goals, as well as the tools and strategies needed to overcome common obstacles which may arise along the way.  From Head to Heart: The Power of a Healthy Outlook  Clinical staff led group instruction and group discussion with PowerPoint presentation and patient guidebook. To enhance the learning environment the use of posters, models and videos may be added. Patients will be able to recognize and describe the impact of emotions and mood on physical health. They will discover the importance of self-care and explore self-care practices which may work for them. Patients will also learn how to utilize the 4 C's to cultivate a healthier outlook and better manage stress and challenges. The purpose of this lesson is to demonstrate to patients how a healthy outlook is an essential part of maintaining good health, especially as they continue their cardiac rehab journey.  Healthy Sleep for a Healthy Heart Clinical staff led group instruction and group discussion with PowerPoint presentation and patient guidebook. To enhance the learning environment the use of posters, models and videos may be added. At the conclusion of this workshop, patients will be able to demonstrate knowledge of the importance of sleep to overall health, well-being, and quality of life. They will understand the symptoms of, and treatments for, common sleep disorders. Patients will also be able to identify daytime and nighttime behaviors which impact sleep, and they will be able to apply these tools to help manage sleep-related challenges. The purpose of this lesson is to provide patients with a general overview of sleep and outline the importance of quality sleep. Patients will learn about a few of the most common sleep disorders. Patients will also be introduced to the concept of "sleep hygiene," and discover ways to self-manage certain sleeping problems through simple daily behavior changes. Finally, the workshop will motivate  patients by clarifying the links between quality sleep and their goals of heart-healthy living.   Recognizing and Reducing Stress Clinical staff led group instruction and group discussion with PowerPoint presentation and patient guidebook. To enhance the learning environment the use of posters, models and videos may be added. At the conclusion of this workshop, patients will  be able to understand the types of stress reactions, differentiate between acute and chronic stress, and recognize the impact that chronic stress has on their health. They will also be able to apply different coping mechanisms, such as reframing negative self-talk. Patients will have the opportunity to practice a variety of stress management techniques, such as deep abdominal breathing, progressive muscle relaxation, and/or guided imagery.  The purpose of this lesson is to educate patients on the role of stress in their lives and to provide healthy techniques for coping with it.  Learning Barriers/Preferences:  Learning Barriers/Preferences - 02/01/23 1336       Learning Barriers/Preferences   Learning Barriers Sight    Learning Preferences Computer/Internet;Pictoral;Video             Education Topics:  Knowledge Questionnaire Score:  Knowledge Questionnaire Score - 02/01/23 1335       Knowledge Questionnaire Score   Pre Score 21/24             Core Components/Risk Factors/Patient Goals at Admission:  Personal Goals and Risk Factors at Admission - 02/01/23 1335       Core Components/Risk Factors/Patient Goals on Admission   Hypertension Yes    Intervention Provide education on lifestyle modifcations including regular physical activity/exercise, weight management, moderate sodium restriction and increased consumption of fresh fruit, vegetables, and low fat dairy, alcohol moderation, and smoking cessation.;Monitor prescription use compliance.    Expected Outcomes Short Term: Continued assessment and  intervention until BP is < 140/52mm HG in hypertensive participants. < 130/1mm HG in hypertensive participants with diabetes, heart failure or chronic kidney disease.;Long Term: Maintenance of blood pressure at goal levels.    Lipids Yes    Intervention Provide education and support for participant on nutrition & aerobic/resistive exercise along with prescribed medications to achieve LDL 70mg , HDL >40mg .    Expected Outcomes Short Term: Participant states understanding of desired cholesterol values and is compliant with medications prescribed. Participant is following exercise prescription and nutrition guidelines.;Long Term: Cholesterol controlled with medications as prescribed, with individualized exercise RX and with personalized nutrition plan. Value goals: LDL < 70mg , HDL > 40 mg.             Core Components/Risk Factors/Patient Goals Review:   Goals and Risk Factor Review     Row Name 02/08/23 0910             Core Components/Risk Factors/Patient Goals Review   Personal Goals Review Weight Management/Obesity;Hypertension;Lipids       Review Amy Kerr started intensive cardiac rehab on 02/08/23 and did well with exercise. Vital signs were stable       Expected Outcomes Amy Kerr will continue to participate in intensive cardiac rehab for exercise, nutrition and lifestyle modifications                Core Components/Risk Factors/Patient Goals at Discharge (Final Review):   Goals and Risk Factor Review - 02/08/23 0910       Core Components/Risk Factors/Patient Goals Review   Personal Goals Review Weight Management/Obesity;Hypertension;Lipids    Review Amy Kerr started intensive cardiac rehab on 02/08/23 and did well with exercise. Vital signs were stable    Expected Outcomes Amy Kerr will continue to participate in intensive cardiac rehab for exercise, nutrition and lifestyle modifications             ITP Comments:  ITP Comments     Row Name 02/01/23 1035 02/08/23 0909          ITP Comments Armanda Magic,  MD:  Medical Director.  Introduction to the [facility] / Intensive Cardiac Rehab.  Initial orientation packet reviewed with the patient. 30 Day ITP Review. Mitsue started intensive cardiac rehab on 02/08/23 and did well with exercise               Comments: See ITP comments.Amy Headings RN BSN

## 2023-02-09 ENCOUNTER — Encounter (HOSPITAL_COMMUNITY)
Admission: RE | Admit: 2023-02-09 | Discharge: 2023-02-09 | Disposition: A | Discharge status: Home / Self Care | Payer: Medicare Other | Source: Ambulatory Visit | Attending: Cardiology | Admitting: Cardiology

## 2023-02-09 DIAGNOSIS — Z955 Presence of coronary angioplasty implant and graft: Secondary | ICD-10-CM

## 2023-02-10 DIAGNOSIS — M8588 Other specified disorders of bone density and structure, other site: Secondary | ICD-10-CM | POA: Diagnosis not present

## 2023-02-11 ENCOUNTER — Encounter (HOSPITAL_COMMUNITY)
Admission: RE | Admit: 2023-02-11 | Discharge: 2023-02-11 | Disposition: A | Discharge status: Home / Self Care | Payer: Medicare Other | Source: Ambulatory Visit | Attending: Cardiology | Admitting: Cardiology

## 2023-02-11 DIAGNOSIS — Z955 Presence of coronary angioplasty implant and graft: Secondary | ICD-10-CM | POA: Diagnosis not present

## 2023-02-14 ENCOUNTER — Encounter (HOSPITAL_COMMUNITY)
Admission: RE | Admit: 2023-02-14 | Discharge: 2023-02-14 | Disposition: A | Discharge status: Home / Self Care | Payer: Medicare Other | Source: Ambulatory Visit | Attending: Cardiology | Admitting: Cardiology

## 2023-02-14 DIAGNOSIS — Z955 Presence of coronary angioplasty implant and graft: Secondary | ICD-10-CM

## 2023-02-16 ENCOUNTER — Encounter (HOSPITAL_COMMUNITY)
Admission: RE | Admit: 2023-02-16 | Discharge: 2023-02-16 | Disposition: A | Discharge status: Home / Self Care | Payer: Medicare Other | Source: Ambulatory Visit | Attending: Cardiology | Admitting: Cardiology

## 2023-02-16 DIAGNOSIS — Z955 Presence of coronary angioplasty implant and graft: Secondary | ICD-10-CM

## 2023-02-16 IMAGING — US US ABDOMEN LIMITED
1 series · 14 of 25 positions shown · non-contrast
Comparison: 02/04/2021 ultrasound of the abdomen

CLINICAL DATA: Right upper quadrant pain, nausea, diarrhea

EXAM:
ULTRASOUND ABDOMEN LIMITED RIGHT UPPER QUADRANT

[Series 1: us abdomen limited · 0.23mm/px · 14 of 61 slices shown]
[im 1/61]
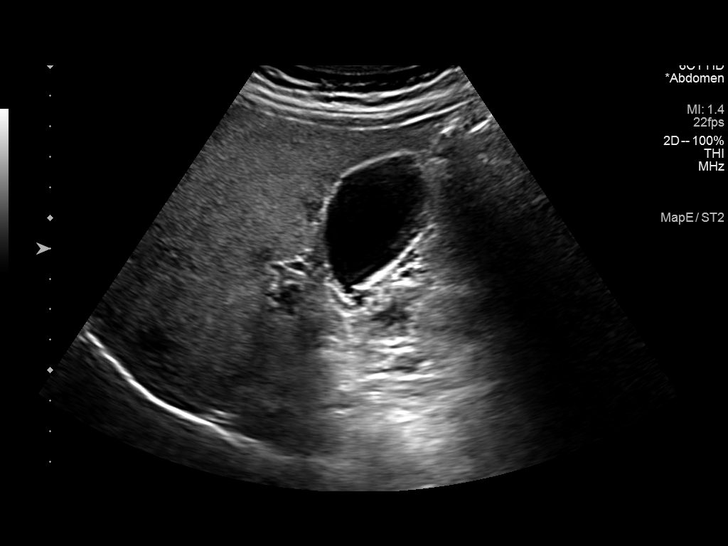
[im 6/61]
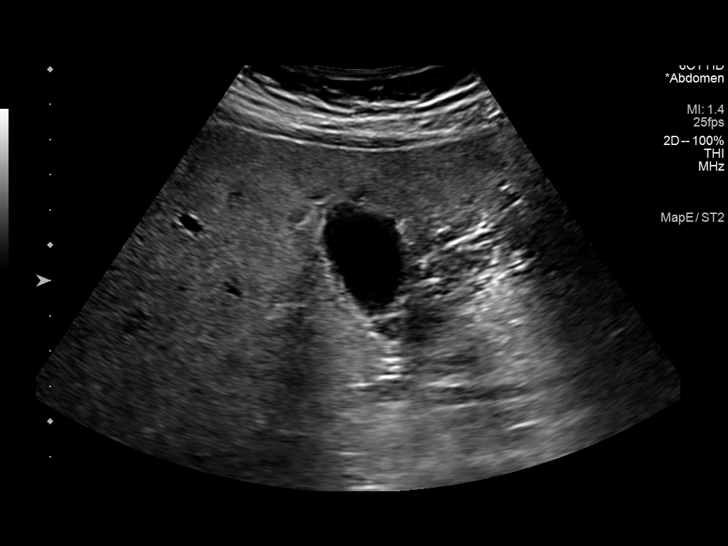
[im 11/61]
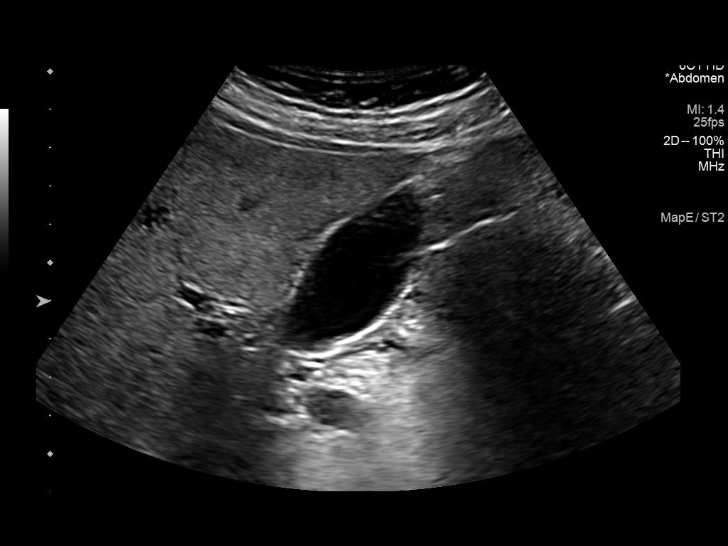
[im 16/61]
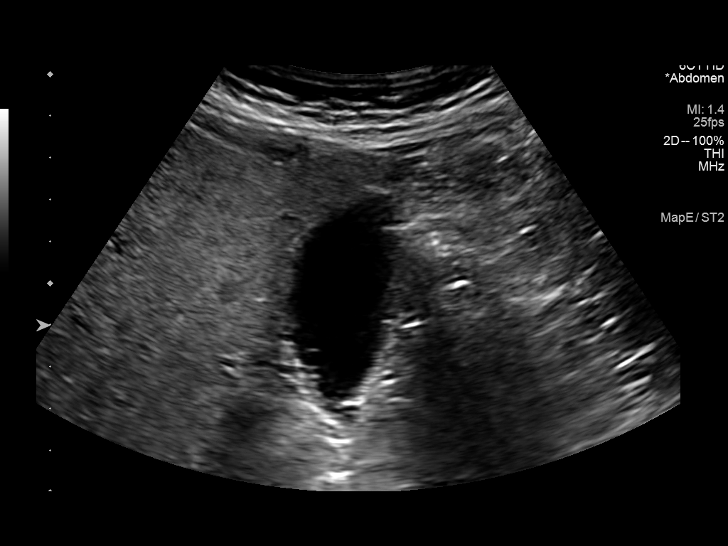
[im 21/61]
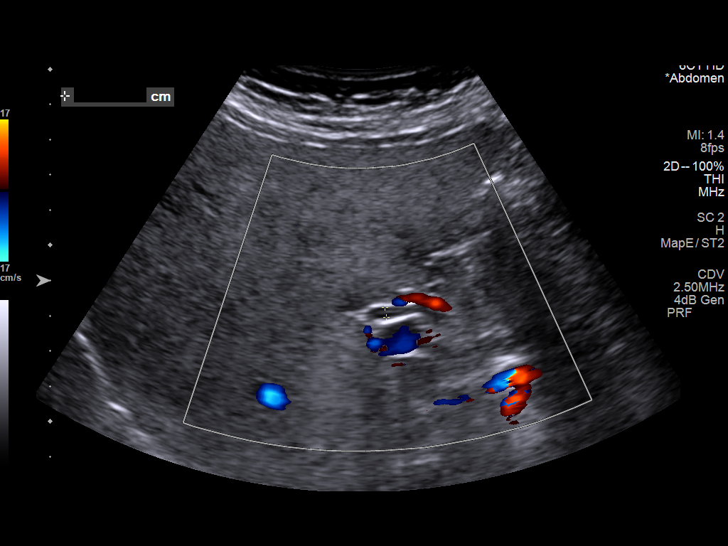
[im 23/61]
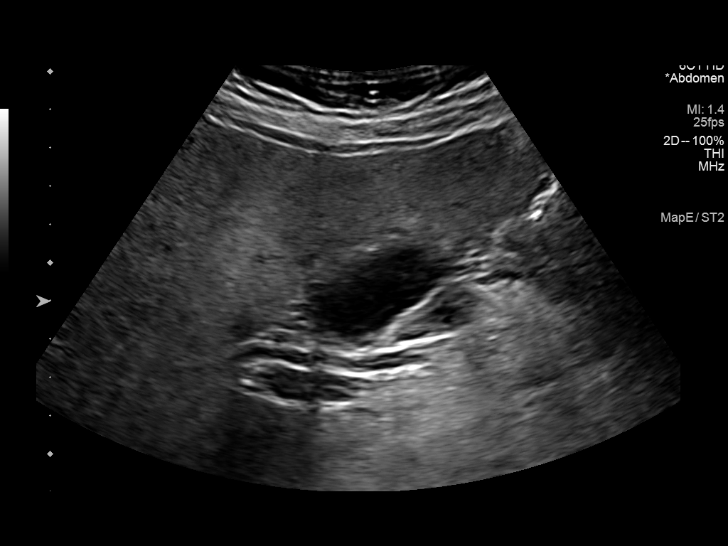
[im 28/61]
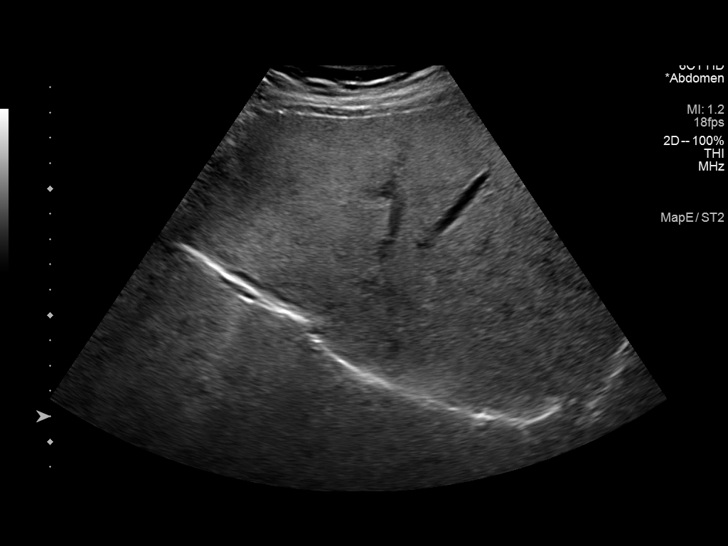
[im 33/61]
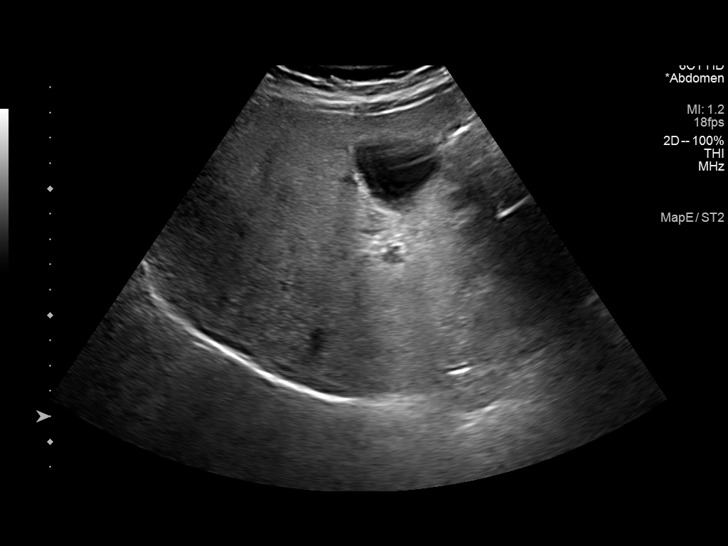
[im 38/61]
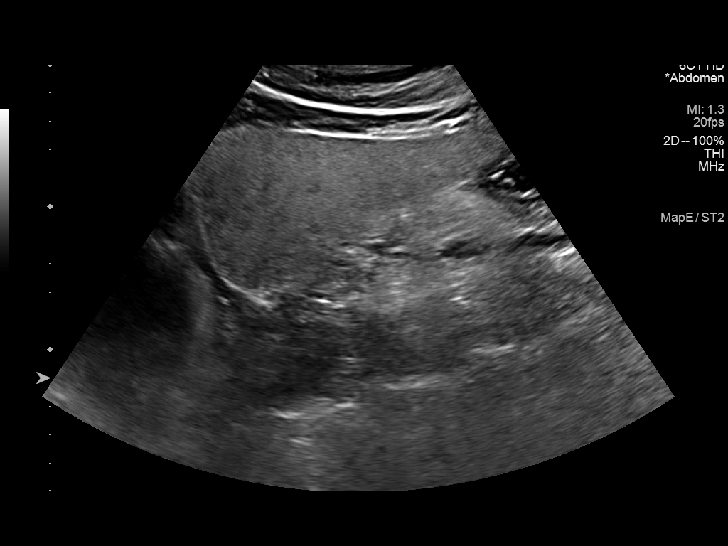
[im 41/61]
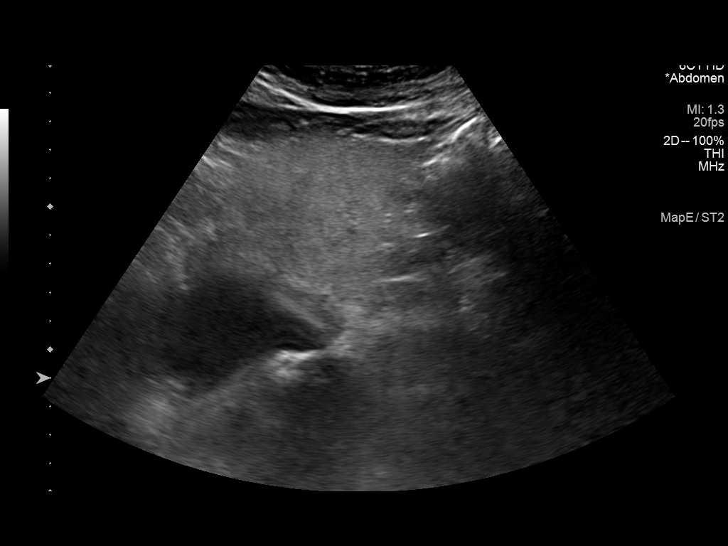
[im 46/61]
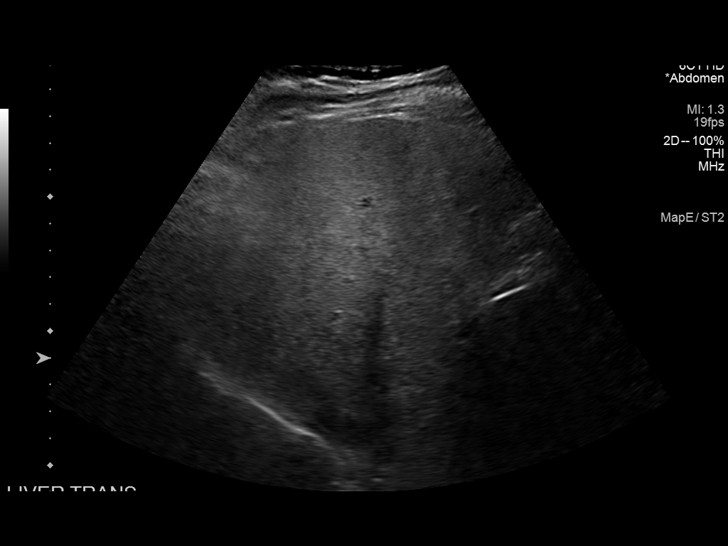
[im 51/61]
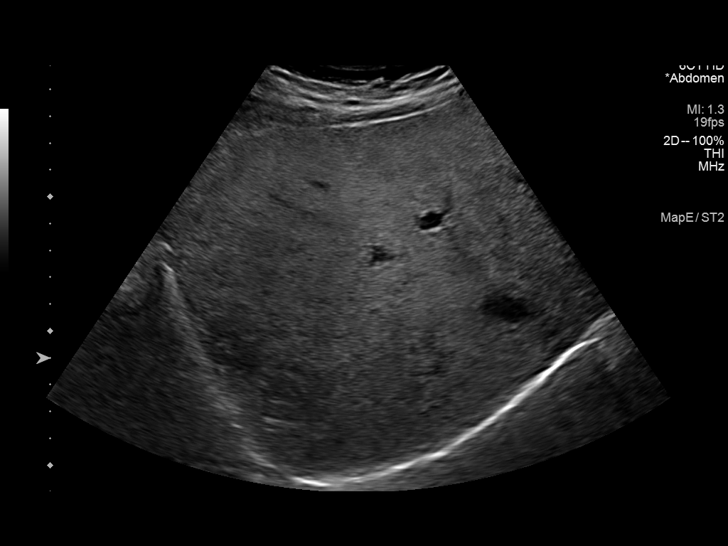
[im 56/61]
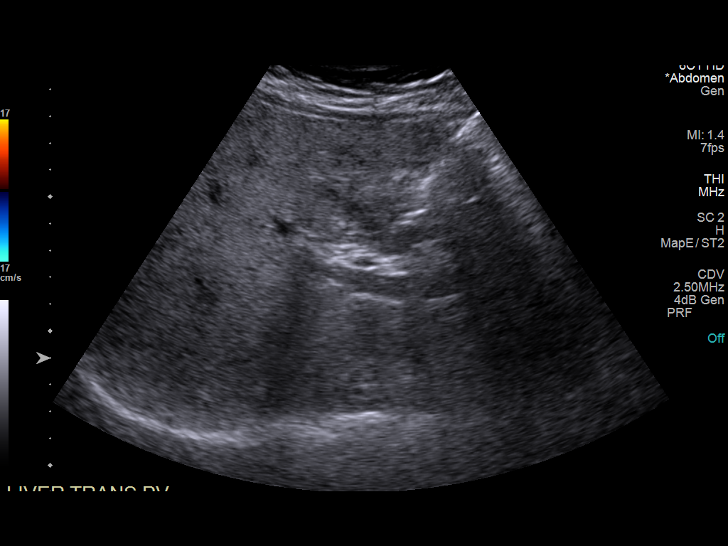
[im 61/61]
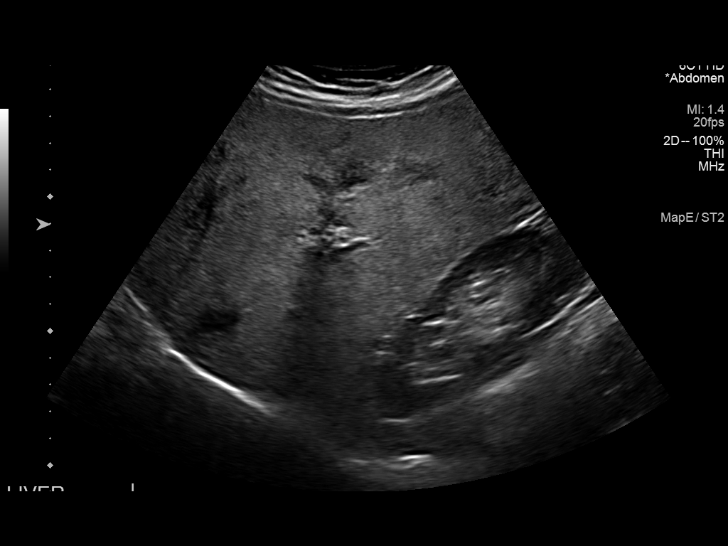

[14 of 25 positions shown; findings below may reference images not displayed]

FINDINGS: Gallbladder:

No gallstones or wall thickening visualized. No sonographic Murphy
sign noted by sonographer, although the patient did report some
tenderness in this area during the exam.

Common bile duct:

Diameter: 3 mm

Liver:

No focal lesion identified. Increased parenchymal echogenicity, with
an area of focal sparing near the gallbladder fossa. Portal vein is
patent on color Doppler imaging with normal direction of blood flow
towards the liver.

Other: None.
IMPRESSION: 1.  No acute process in the right upper quadrant.

2.  Redemonstrated hepatic steatosis.

## 2023-02-17 ENCOUNTER — Telehealth: Payer: Self-pay

## 2023-02-17 NOTE — Telephone Encounter (Signed)
Patient says she is beginning to bruise quite a lot since starting on Brilinta. Cardiac rehab asked her to follow up with you and see if she needed to change medication or come in for a visit.

## 2023-02-18 ENCOUNTER — Encounter (HOSPITAL_COMMUNITY)
Admission: RE | Admit: 2023-02-18 | Discharge: 2023-02-18 | Disposition: A | Discharge status: Home / Self Care | Payer: Medicare Other | Source: Ambulatory Visit | Attending: Cardiology | Admitting: Cardiology

## 2023-02-18 DIAGNOSIS — Z955 Presence of coronary angioplasty implant and graft: Secondary | ICD-10-CM

## 2023-02-18 NOTE — Telephone Encounter (Signed)
Spoke to the patient over the phone.  We talked about transitioning to Plavix; however, it may still cause bruising (but likely of less severity). Patient states that she is doing well and Brilinta without any side effects and would like to continue the same for now.  Tacarra Justo [place], DO, [facility]

## 2023-02-21 ENCOUNTER — Encounter (HOSPITAL_COMMUNITY)
Admission: RE | Admit: 2023-02-21 | Discharge: 2023-02-21 | Disposition: A | Discharge status: Home / Self Care | Payer: Medicare Other | Source: Ambulatory Visit | Attending: Cardiology | Admitting: Cardiology

## 2023-02-21 DIAGNOSIS — Z955 Presence of coronary angioplasty implant and graft: Secondary | ICD-10-CM | POA: Diagnosis not present

## 2023-02-21 NOTE — Progress Notes (Signed)
Daily Session Note  Patient Details  Name: Amy Kerr MRN: [id number] Date of Birth: Jan 23, 1951 Referring Provider:   Flowsheet Row INTENSIVE CARDIAC REHAB ORIENT from 02/01/2023 in [facility]  Referring Provider Tessa Lerner, MD       Encounter Date: 02/21/2023  Check In:  Session Check In - 02/21/23 8295       Check-In   Supervising physician immediately available to respond to emergencies [facility] MD immediately available    Physician(s) Bernadene Person NP    Location [facility]    Staff Present Lorin Picket, MS, ACSM-CEP, CCRP, Exercise Physiologist;Rayshad Riviello Cleophas Dunker, RN, MSN;Mary Gerre Scull, RN, Zachery Conch, MS, ACSM-CEP, Exercise Physiologist;Jetta Walker BS, ACSM-CEP, Exercise Physiologist    Virtual Visit No    Medication changes reported     No    Fall or balance concerns reported    No    Tobacco Cessation No Change    Warm-up and Cool-down Performed as group-led instruction    Resistance Training Performed No    VAD Patient? No    PAD/SET Patient? No      Pain Assessment   Currently in Pain? No/denies    Pain Score 0-No pain    Multiple Pain Sites No             Capillary Blood Glucose: No results found for this or any previous visit (from the past 24 hour(s)).    Social History   Tobacco Use  Smoking Status Never  Smokeless Tobacco Never    Goals Met:  Independence with exercise equipment Exercise tolerated well No report of concerns or symptoms today Strength training completed today  Goals Unmet:  Not Applicable  Comments: No issues.   Dr. Armanda Magic is Medical Director for Cardiac Rehab at [facility].

## 2023-02-23 ENCOUNTER — Encounter (HOSPITAL_COMMUNITY)
Admission: RE | Admit: 2023-02-23 | Discharge: 2023-02-23 | Disposition: A | Discharge status: Home / Self Care | Payer: Medicare Other | Source: Ambulatory Visit | Attending: Cardiology | Admitting: Cardiology

## 2023-02-23 DIAGNOSIS — Z955 Presence of coronary angioplasty implant and graft: Secondary | ICD-10-CM

## 2023-02-25 ENCOUNTER — Encounter (HOSPITAL_COMMUNITY)
Admission: RE | Admit: 2023-02-25 | Discharge: 2023-02-25 | Disposition: A | Discharge status: Home / Self Care | Payer: Medicare Other | Source: Ambulatory Visit | Attending: Cardiology | Admitting: Cardiology

## 2023-02-25 DIAGNOSIS — Z955 Presence of coronary angioplasty implant and graft: Secondary | ICD-10-CM

## 2023-03-02 ENCOUNTER — Telehealth (HOSPITAL_COMMUNITY): Payer: Self-pay | Admitting: *Deleted

## 2023-03-02 ENCOUNTER — Encounter (HOSPITAL_COMMUNITY): Payer: Medicare Other

## 2023-03-02 ENCOUNTER — Telehealth: Payer: Self-pay

## 2023-03-02 DIAGNOSIS — H1131 Conjunctival hemorrhage, right eye: Secondary | ICD-10-CM | POA: Diagnosis not present

## 2023-03-02 NOTE — Telephone Encounter (Signed)
Patient wanted to make you aware that she had a blood vessel burst in her eye. She was seen by a Ophthalmologist and was told it was a very mild and would heal.

## 2023-03-03 ENCOUNTER — Other Ambulatory Visit: Payer: Self-pay | Admitting: Cardiology

## 2023-03-03 DIAGNOSIS — Z95820 Peripheral vascular angioplasty status with implants and grafts: Secondary | ICD-10-CM | POA: Diagnosis not present

## 2023-03-03 DIAGNOSIS — I2510 Atherosclerotic heart disease of native coronary artery without angina pectoris: Secondary | ICD-10-CM | POA: Diagnosis not present

## 2023-03-03 DIAGNOSIS — E782 Mixed hyperlipidemia: Secondary | ICD-10-CM | POA: Diagnosis not present

## 2023-03-04 ENCOUNTER — Encounter (HOSPITAL_COMMUNITY)
Admission: RE | Admit: 2023-03-04 | Discharge: 2023-03-04 | Disposition: A | Discharge status: Home / Self Care | Payer: Medicare Other | Source: Ambulatory Visit | Attending: Cardiology | Admitting: Cardiology

## 2023-03-04 DIAGNOSIS — Z955 Presence of coronary angioplasty implant and graft: Secondary | ICD-10-CM

## 2023-03-04 LAB — CMP14+EGFR
ALT: 27 IU/L (ref 0–32)
AST: 19 IU/L (ref 0–40)
Albumin/Globulin Ratio: 1.5 (ref 1.2–2.2)
Albumin: 3.9 g/dL (ref 3.8–4.8)
Alkaline Phosphatase: 95 IU/L (ref 44–121)
BUN/Creatinine Ratio: 15 (ref 12–28)
BUN: 18 mg/dL (ref 8–27)
Bilirubin Total: 0.7 mg/dL (ref 0.0–1.2)
CO2: 21 mmol/L (ref 20–29)
Calcium: 9 mg/dL (ref 8.7–10.3)
Chloride: 106 mmol/L (ref 96–106)
Creatinine, Ser: 1.17 mg/dL — ABNORMAL HIGH (ref 0.57–1.00)
Globulin, Total: 2.6 g/dL (ref 1.5–4.5)
Glucose: 112 mg/dL — ABNORMAL HIGH (ref 70–99)
Potassium: 4.4 mmol/L (ref 3.5–5.2)
Sodium: 141 mmol/L (ref 134–144)
Total Protein: 6.5 g/dL (ref 6.0–8.5)
eGFR: 50 mL/min/{1.73_m2} — ABNORMAL LOW (ref >59)

## 2023-03-04 LAB — LIPID PANEL WITH LDL/HDL RATIO
Cholesterol, Total: 154 mg/dL (ref 100–199)
HDL: 38 mg/dL — ABNORMAL LOW (ref >39)
LDL Chol Calc (NIH): 80 mg/dL (ref 0–99)
LDL/HDL Ratio: 2.1 ratio (ref 0.0–3.2)
Triglycerides: 216 mg/dL — ABNORMAL HIGH (ref 0–149)
VLDL Cholesterol Cal: 36 mg/dL (ref 5–40)

## 2023-03-04 LAB — LDL CHOLESTEROL, DIRECT: LDL Direct: 72 mg/dL (ref 0–99)

## 2023-03-07 ENCOUNTER — Encounter (HOSPITAL_COMMUNITY)
Admission: RE | Admit: 2023-03-07 | Discharge: 2023-03-07 | Disposition: A | Discharge status: Home / Self Care | Payer: Medicare Other | Source: Ambulatory Visit | Attending: Cardiology | Admitting: Cardiology

## 2023-03-07 DIAGNOSIS — Z955 Presence of coronary angioplasty implant and graft: Secondary | ICD-10-CM | POA: Insufficient documentation

## 2023-03-07 NOTE — Progress Notes (Signed)
Patient called back and transferred the call to Dr, Odis Hollingshead.

## 2023-03-08 NOTE — Progress Notes (Signed)
Cardiac Individual Treatment Plan  Patient Details  Name: Amy Kerr MRN: [id number] Date of Birth: Feb 01, 1951 Referring Provider:   Flowsheet Row INTENSIVE CARDIAC REHAB ORIENT from 02/01/2023 in [facility]  Referring Provider Tessa Lerner, MD       Initial Encounter Date:  Flowsheet Row INTENSIVE CARDIAC REHAB ORIENT from 02/01/2023 in [facility], [facility], [facility] Lung [facility]  Date 02/01/23       Visit Diagnosis: 12/21/22 Status post coronary artery stent placement OM1  Patient's Home Medications on Admission:  Current Outpatient Medications:    amLODipine (NORVASC) 10 MG tablet, Take 1 tablet (10 mg total) by mouth every morning. (Patient taking differently: Take 10 mg by mouth daily.), Disp: 90 tablet, Rfl: 0   aspirin EC 81 MG tablet, Take 1 tablet (81 mg total) by mouth daily. Swallow whole., Disp: 90 tablet, Rfl: 3   atorvastatin (LIPITOR) 40 MG tablet, Take 1 tablet (40 mg total) by mouth daily., Disp: 90 tablet, Rfl: 0   Cholecalciferol (VITAMIN D-3) 25 MCG (1000 UT) CAPS, Take 2,000 Units by mouth daily., Disp: , Rfl:    metoprolol succinate (TOPROL XL) 25 MG 24 hr tablet, Take 1 tablet (25 mg total) by mouth every morning. Hold if systolic blood pressure (top number) less than 100 mmHg or pulse less than 60 bpm., Disp: 30 tablet, Rfl: 11   nitroGLYCERIN (NITROSTAT) 0.4 MG SL tablet, Place 1 tablet (0.4 mg total) under the tongue every 5 (five) minutes as needed for chest pain. If you require more than two tablets five minutes apart go to the nearest ER via EMS., Disp: 30 tablet, Rfl: 0   ticagrelor (BRILINTA) 90 MG TABS tablet, Take 1 tablet (90 mg total) by mouth 2 (two) times daily., Disp: 180 tablet, Rfl: 3  Past Medical History: Past Medical History:  Diagnosis Date   Coronary artery disease    Hyperlipidemia    Hypertension     Tobacco Use: Social History   Tobacco Use   Smoking Status Never  Smokeless Tobacco Never    Labs: Review Flowsheet       Latest Ref Rng & Units 03/03/2023  Labs for ITP Cardiac and Pulmonary Rehab  Cholestrol 100 - 199 mg/dL 629   LDL (calc) 0 - 99 mg/dL 80   Direct LDL 0 - 99 mg/dL 72   HDL-C >52 mg/dL 38   Trlycerides 0 - 841 mg/dL 324     Capillary Blood Glucose: No results found for: "GLUCAP"   Exercise Target Goals: Exercise Program Goal: Individual exercise prescription set using results from initial 6 min walk test and THRR while considering  patient's activity barriers and safety.   Exercise Prescription Goal: Initial exercise prescription builds to 30-45 minutes a day of aerobic activity, 2-3 days per week.  Home exercise guidelines will be given to patient during program as part of exercise prescription that the participant will acknowledge.  Activity Barriers & Risk Stratification:  Activity Barriers & Cardiac Risk Stratification - 02/01/23 1330       Activity Barriers & Cardiac Risk Stratification   Activity Barriers Neck/Spine Problems;Arthritis;Back Problems;Joint Problems;Deconditioning    Cardiac Risk Stratification High             6 Minute Walk:  6 Minute Walk     Row Name 02/01/23 1123         6 Minute Walk   Phase Initial  Distance 1164 feet     Walk Time 6 minutes     # of Rest Breaks 0     MPH 2.2     METS 2.42     RPE 8     Perceived Dyspnea  0     VO2 Peak 8.5     Symptoms No     Resting HR 70 bpm     Resting BP 118/78     Resting Oxygen Saturation  98 %     Exercise Oxygen Saturation  during 6 min walk 98 %     Max Ex. HR 94 bpm     Max Ex. BP 144/76     2 Minute Post BP 124/70              Oxygen Initial Assessment:   Oxygen Re-Evaluation:   Oxygen Discharge (Final Oxygen Re-Evaluation):   Initial Exercise Prescription:  Initial Exercise Prescription - 02/01/23 1300       Date of Initial Exercise RX and Referring Provider   Date 02/01/23     Referring Provider Tessa Lerner, MD    Expected Discharge Date 04/15/23      NuStep   Level 2    SPM 75    Minutes 15    METs 2.4      Track   Laps 8    Minutes 15    METs 2.4      Prescription Details   Frequency (times per week) 3    Duration Progress to 30 minutes of continuous aerobic without signs/symptoms of physical distress      Intensity   THRR 40-80% of Max Heartrate 59-118    Ratings of Perceived Exertion 11-13    Perceived Dyspnea 0-4      Progression   Progression Continue progressive overload as per policy without signs/symptoms or physical distress.      Resistance Training   Training Prescription Yes    Weight 2 lbs    Reps 10-15             Perform Capillary Blood Glucose checks as needed.  Exercise Prescription Changes:   Exercise Prescription Changes     Row Name 02/07/23 1100 02/23/23 1400           Response to Exercise   Blood Pressure (Admit) 118/64 114/72      Blood Pressure (Exercise) 150/80 128/82      Blood Pressure (Exit) 110/70 110/60      Heart Rate (Admit) 77 bpm 67 bpm      Heart Rate (Exercise) 106 bpm 111 bpm      Heart Rate (Exit) 85 bpm 78 bpm      Rating of Perceived Exertion (Exercise) 11 9      Symptoms None None      Comments Pt's first day in the CRP2 program Reviewed METs      Duration Continue with 30 min of aerobic exercise without signs/symptoms of physical distress. Continue with 30 min of aerobic exercise without signs/symptoms of physical distress.      Intensity THRR unchanged THRR unchanged        Progression   Progression Continue to progress workloads to maintain intensity without signs/symptoms of physical distress. Continue to progress workloads to maintain intensity without signs/symptoms of physical distress.      Average METs 2.57 3.13        Resistance Training   Training Prescription Yes No      Weight 2 lbs No  weights on wednesdays      Reps 10-15 10-15      Time 10 Minutes 10 Minutes         Interval Training   Interval Training No No        NuStep   Level 2 2      SPM 75 --      Minutes 15 15      METs 2.1 2.7        Track   Laps 16 20      Minutes 15 15      METs 3.04 3.55               Exercise Comments:   Exercise Comments     Row Name 02/07/23 1109 02/23/23 1403         Exercise Comments Pt's first day in the CRP2 program. Pt exercised without complaint and is off to a good start. Reviewed METs. Pt is making good progress in the program. No complaints.               Exercise Goals and Review:   Exercise Goals     Row Name 02/01/23 1331             Exercise Goals   Increase Physical Activity Yes       Intervention Provide advice, education, support and counseling about physical activity/exercise needs.;Develop an individualized exercise prescription for aerobic and resistive training based on initial evaluation findings, risk stratification, comorbidities and participant's personal goals.       Expected Outcomes Short Term: Attend rehab on a regular basis to increase amount of physical activity.;Long Term: Add in home exercise to make exercise part of routine and to increase amount of physical activity.;Long Term: Exercising regularly at least 3-5 days a week.       Increase Strength and Stamina Yes       Intervention Provide advice, education, support and counseling about physical activity/exercise needs.;Develop an individualized exercise prescription for aerobic and resistive training based on initial evaluation findings, risk stratification, comorbidities and participant's personal goals.       Expected Outcomes Short Term: Increase workloads from initial exercise prescription for resistance, speed, and METs.;Short Term: Perform resistance training exercises routinely during rehab and add in resistance training at home;Long Term: Improve cardiorespiratory fitness, muscular endurance and strength as measured by increased METs and functional  capacity ( )       Able to understand and use rate of perceived exertion (RPE) scale Yes       Intervention Provide education and explanation on how to use RPE scale       Expected Outcomes Short Term: Able to use RPE daily in rehab to express subjective intensity level;Long Term:  Able to use RPE to guide intensity level when exercising independently       Knowledge and understanding of Target Heart Rate Range (THRR) Yes       Intervention Provide education and explanation of THRR including how the numbers were predicted and where they are located for reference       Expected Outcomes Short Term: Able to state/look up THRR;Long Term: Able to use THRR to govern intensity when exercising independently;Short Term: Able to use daily as guideline for intensity in rehab       Understanding of Exercise Prescription Yes       Intervention Provide education, explanation, and written materials on patient's individual exercise prescription       Expected Outcomes Short  Term: Able to explain program exercise prescription;Long Term: Able to explain home exercise prescription to exercise independently                Exercise Goals Re-Evaluation :  Exercise Goals Re-Evaluation     Row Name 02/07/23 1107             Exercise Goal Re-Evaluation   Exercise Goals Review Increase Physical Activity;Increase Strength and Stamina;Able to understand and use rate of perceived exertion (RPE) scale;Knowledge and understanding of Target Heart Rate Range (THRR);Understanding of Exercise Prescription       Comments Pt's first day in the CRP2 program. Pt understands the exercise Rx, RPE scale, and THRR.       Expected Outcomes Will continue to monitor patient and progress exercise workloads as tolerated.                Discharge Exercise Prescription (Final Exercise Prescription Changes):  Exercise Prescription Changes - 02/23/23 1400       Response to Exercise   Blood Pressure (Admit) 114/72     Blood Pressure (Exercise) 128/82    Blood Pressure (Exit) 110/60    Heart Rate (Admit) 67 bpm    Heart Rate (Exercise) 111 bpm    Heart Rate (Exit) 78 bpm    Rating of Perceived Exertion (Exercise) 9    Symptoms None    Comments Reviewed METs    Duration Continue with 30 min of aerobic exercise without signs/symptoms of physical distress.    Intensity THRR unchanged      Progression   Progression Continue to progress workloads to maintain intensity without signs/symptoms of physical distress.    Average METs 3.13      Resistance Training   Training Prescription No    Weight No weights on wednesdays    Reps 10-15    Time 10 Minutes      Interval Training   Interval Training No      NuStep   Level 2    Minutes 15    METs 2.7      Track   Laps 20    Minutes 15    METs 3.55             Nutrition:  Target Goals: Understanding of nutrition guidelines, daily intake of sodium 1500mg , cholesterol 200mg , calories 30% from fat and 7% or less from saturated fats, daily to have 5 or more servings of fruits and vegetables.  Biometrics:  Pre Biometrics - 02/01/23 1024       Pre Biometrics   Waist Circumference 37.75 inches    Hip Circumference 44 inches    Waist to Hip Ratio 0.86 %    Triceps Skinfold 30 mm    % Body Fat 41.7 %    Grip Strength 33 kg    Flexibility 17.5 in    Single Leg Stand 30 seconds              Nutrition Therapy Plan and Nutrition Goals:  Nutrition Therapy & Goals - 03/07/23 0958       Nutrition Therapy   Diet heart healthy diet    Drug/Food Interactions Statins/Certain Fruits      Personal Nutrition Goals   Nutrition Goal Patient to identify strategies for reducing cardiovascular risk by attending the [employer] education and nutrition series weekly.    Personal Goal #2 Patient to improve diet quality by using the plate method as a guide for meal planning to include lean protein/plant protein, fruits,  vegetables, whole grains, nonfat  dairy as part of a well-balanced diet.    Personal Goal #3 Patient to limit sodium intake 1500mg  per day    Comments Goals in action. Breleigh continues to attend the [employer] education and nutrition series regularly. Fiora reports that she has started making many dietary changes including reduced sodium, increased dietary fiber, reduced saturated fat, and reading food labels. Her husband is very supportive as well. She is down 4.4# since starting with our program. Patient will benefit from participation in intensive cardiac rehab for nutrition, exercise, and lifestyle modification.      Intervention Plan   Intervention Prescribe, educate and counsel regarding individualized specific dietary modifications aiming towards targeted core components such as weight, hypertension, lipid management, diabetes, heart failure and other comorbidities.;Nutrition handout(s) given to patient.    Expected Outcomes Short Term Goal: Understand basic principles of dietary content, such as calories, fat, sodium, cholesterol and nutrients.;Long Term Goal: Adherence to prescribed nutrition plan.             Nutrition Assessments:  Nutrition Assessments - 02/07/23 0855       Rate Your Plate Scores   Pre Score 76            MEDIFICTS Score Key: ?70 Need to make dietary changes  40-70 Heart Healthy Diet ? 40 Therapeutic Level Cholesterol Diet   Flowsheet Row INTENSIVE CARDIAC REHAB from 02/07/2023 in [facility] for Heart, Vascular, & Lung Health  Picture Your Plate Total Score on Admission 76      Picture Your Plate Scores: <16 Unhealthy dietary pattern with much room for improvement. 41-50 Dietary pattern unlikely to meet recommendations for good health and room for improvement. 51-60 More healthful dietary pattern, with some room for improvement.  >60 Healthy dietary pattern, although there may be some specific behaviors that could be improved.    Nutrition Goals  Re-Evaluation:  Nutrition Goals Re-Evaluation     Row Name 02/07/23 0847 03/07/23 0958           Goals   Current Weight 176 lb 5.9 oz (80 kg) 171 lb 15.3 oz (78 kg)      Comment triglycerides 219, VitaminD 24.7,  Cr. 1.44, GFR 39 triglycerides 216, HDL 38, GFR 50, Cr 1.17      Expected Outcome Physllis reports that she has started making many dietary changes including reduced sodium and reading food labels. Her husband is very supportive as well. Patient will benefit from participation in intensive cardiac rehab for nutrition, exercise, and lifestyle modification. Goals in action. Burnell continues to attend the [employer] education and nutrition series regularly. Gal reports that she has started making many dietary changes including reduced sodium, increased dietary fiber, reduced saturated fat, and reading food labels. Her husband is very supportive as well. She is down 4.4# since starting with our program. Patient will benefit from participation in intensive cardiac rehab for nutrition, exercise, and lifestyle modification.               Nutrition Goals Re-Evaluation:  Nutrition Goals Re-Evaluation     Row Name 02/07/23 0847 03/07/23 0958           Goals   Current Weight 176 lb 5.9 oz (80 kg) 171 lb 15.3 oz (78 kg)      Comment triglycerides 219, VitaminD 24.7,  Cr. 1.44, GFR 39 triglycerides 216, HDL 38, GFR 50, Cr 1.17      Expected Outcome Physllis reports that she has started  making many dietary changes including reduced sodium and reading food labels. Her husband is very supportive as well. Patient will benefit from participation in intensive cardiac rehab for nutrition, exercise, and lifestyle modification. Goals in action. Adelinn continues to attend the [employer] education and nutrition series regularly. Carson reports that she has started making many dietary changes including reduced sodium, increased dietary fiber, reduced saturated fat, and reading food labels. Her husband  is very supportive as well. She is down 4.4# since starting with our program. Patient will benefit from participation in intensive cardiac rehab for nutrition, exercise, and lifestyle modification.               Nutrition Goals Discharge (Final Nutrition Goals Re-Evaluation):  Nutrition Goals Re-Evaluation - 03/07/23 0958       Goals   Current Weight 171 lb 15.3 oz (78 kg)    Comment triglycerides 216, HDL 38, GFR 50, Cr 1.17    Expected Outcome Goals in action. Latiesha continues to attend the [employer] education and nutrition series regularly. Yocelyne reports that she has started making many dietary changes including reduced sodium, increased dietary fiber, reduced saturated fat, and reading food labels. Her husband is very supportive as well. She is down 4.4# since starting with our program. Patient will benefit from participation in intensive cardiac rehab for nutrition, exercise, and lifestyle modification.             Psychosocial: Target Goals: Acknowledge presence or absence of significant depression and/or stress, maximize coping skills, provide positive support system. Participant is able to verbalize types and ability to use techniques and skills needed for reducing stress and depression.  Initial Review & Psychosocial Screening:  Initial Psych Review & Screening - 02/01/23 1042       Initial Review   Current issues with None Identified      Family Dynamics   Good Support System? Yes    Comments Has spouse, 3 children, and several grandchildren for support      Barriers   Psychosocial barriers to participate in program There are no identifiable barriers or psychosocial needs.      Screening Interventions   Interventions Encouraged to exercise             Quality of Life Scores:  Quality of Life - 02/01/23 1336       Quality of Life   Select Quality of Life      Quality of Life Scores   Health/Function Pre 29.1 %    Socioeconomic Pre 30 %     Psych/Spiritual Pre 30 %    Family Pre 30 %    GLOBAL Pre 29.6 %            Scores of 19 and below usually indicate a poorer quality of life in these areas.  A difference of  2-3 points is a clinically meaningful difference.  A difference of 2-3 points in the total score of the Quality of Life Index has been associated with significant improvement in overall quality of life, self-image, physical symptoms, and general health in studies assessing change in quality of life.  PHQ-9: Review Flowsheet       02/01/2023  Depression screen PHQ 2/9  Decreased Interest 0  Down, Depressed, Hopeless 0  PHQ - 2 Score 0  Altered sleeping 1  Tired, decreased energy 1  Change in appetite 0  Feeling bad or failure about yourself  0  Trouble concentrating 0  Moving slowly or fidgety/restless 0  Suicidal  thoughts 0  PHQ-9 Score 2  Difficult doing work/chores Not difficult at all   Interpretation of Total Score  Total Score Depression Severity:  1-4 = Minimal depression, 5-9 = Mild depression, 10-14 = Moderate depression, 15-19 = Moderately severe depression, 20-27 = Severe depression   Psychosocial Evaluation and Intervention:   Psychosocial Re-Evaluation:  Psychosocial Re-Evaluation     Row Name 02/08/23 0910 03/08/23 1125           Psychosocial Re-Evaluation   Current issues with None Identified None Identified      Interventions Encouraged to attend Cardiac Rehabilitation for the exercise Encouraged to attend Cardiac Rehabilitation for the exercise      Continue Psychosocial Services  No Follow up required No Follow up required               Psychosocial Discharge (Final Psychosocial Re-Evaluation):  Psychosocial Re-Evaluation - 03/08/23 1125       Psychosocial Re-Evaluation   Current issues with None Identified    Interventions Encouraged to attend Cardiac Rehabilitation for the exercise    Continue Psychosocial Services  No Follow up required              Vocational Rehabilitation: Provide vocational rehab assistance to qualifying candidates.   Vocational Rehab Evaluation & Intervention:  Vocational Rehab - 02/01/23 1041       Initial Vocational Rehab Evaluation & Intervention   Assessment shows need for Vocational Rehabilitation No   Pt is retired            Education: Education Goals: Education classes will be provided on a weekly basis, covering required topics. Participant will state understanding/return demonstration of topics presented.    Education     Row Name 02/07/23 1100     Education   Cardiac Education Topics [facility]   Select Workshops     Workshops   Educator Exercise Physiologist   Select Psychosocial   Psychosocial Workshop Healthy Sleep for a Healthy Heart   Instruction Review Code 1- Verbalizes Understanding   Class Start Time 0815   Class Stop Time 0856   Class Time Calculation (min) 41 min    Row Name 02/09/23 1000     Education   Cardiac Education Topics [facility]in   Customer service manager   Weekly Topic International Cuisine- Spotlight on the [facility] Zones   Instruction Review Code 1- Verbalizes Understanding   Class Start Time [phone]   Class Stop Time [phone]   Class Time Calculation (min) 32 min    Row Name 02/11/23 0900     Education   Cardiac Education Topics P[employer]ikin   Select Core Videos     Core Videos   Educator Exercise Physiologist   Select Exercise Education   Exercise Education Improving Performance   Instruction Review Code 1- Verbalizes Understanding   Class Start Time [phone]   Class Stop Time 0855   Class Time Calculation (min) 43 min    Row Name 02/14/23 0900     Education   Cardiac Education Topics [facility]in   Select Workshops     Workshops   Educator Dietitian   Select Nutrition   Nutrition Workshop Fueling a Forensic psychologist   Instruction Review Code 1- Verbalizes Understanding   Class Start Time 0815   Class Stop Time 0858    Class Time Calculation (min) 43 min    Row Name 02/16/23 0900     Education   Cardiac Education  Topics Conservation officer, historic buildings   Weekly Topic Simple Sides and Sauces   Instruction Review Code 1- Verbalizes Understanding   Class Start Time 0815   Class Stop Time 0850   Class Time Calculation (min) 35 min    Row Name 02/18/23 0900     Education   Cardiac Education Topics Pritikin   Select Core Videos     Core Videos   Educator Exercise Physiologist   Select Psychosocial   Psychosocial How Our Thoughts Can Heal Our Hearts   Instruction Review Code 1- Verbalizes Understanding   Class Start Time 0815   Class Stop Time 0855   Class Time Calculation (min) 40 min    Row Name 02/21/23 1000     Education   Cardiac Education Topics [facility]ikin   Geographical information systems officer Psychosocial   Psychosocial Workshop From Head to Heart: The Power of a Healthy Outlook   Instruction Review Code 1- Verbalizes Understanding   Class Start Time 0815   Class Stop Time 0903   Class Time Calculation (min) 48 min    Row Name 02/23/23 0900     Education   Cardiac Education Topics Pritikin   Secondary school teacher School   Educator Dietitian   Weekly Topic Powerhouse Plant-Based Proteins   Instruction Review Code 1- Verbalizes Understanding   Class Start Time [phone]   Class Stop Time 0845   Class Time Calculation (min) 35 min    Row Name 02/25/23 0800     Education   Cardiac Education Topics Pritikin   Select Core Videos     Core Videos   Educator Exercise Physiologist   Select General Education   General Education Hypertension and Heart Disease   Instruction Review Code 1- Verbalizes Understanding   Class Start Time [phone]   Class Stop Time 0843   Class Time Calculation (min) 31 min    Row Name 03/04/23 1400     Education   Cardiac Education Topics Pritikin   Administrator, Civil Service General Education   General Education Heart Disease Risk Reduction   Instruction Review Code 1- Verbalizes Understanding   Class Start Time 0810   Class Stop Time 0849   Class Time Calculation (min) 39 min    Row Name 03/07/23 0900     Education   Cardiac Education Topics Pritikin   Select Workshops     Workshops   Educator Exercise Physiologist   Select Exercise   Exercise Workshop Location manager and Fall Prevention   Instruction Review Code 1- Verbalizes Understanding   Class Start Time [phone]   Class Stop Time 0855   Class Time Calculation (min) 36 min            Core Videos: Exercise    Move It!  Clinical staff conducted group or individual video education with verbal and written material and guidebook.  Patient learns the recommended Pritikin exercise program. Exercise with the goal of living a long, healthy life. Some of the health benefits of exercise include controlled diabetes, healthier blood pressure levels, improved cholesterol levels, improved heart and lung capacity, improved sleep, and better body composition. Everyone should speak with their doctor before starting or changing an exercise routine.  Biomechanical Limitations Clinical staff conducted group  or individual video education with verbal and written material and guidebook.  Patient learns how biomechanical limitations can impact exercise and how we can mitigate and possibly overcome limitations to have an impactful and balanced exercise routine.  Body Composition Clinical staff conducted group or individual video education with verbal and written material and guidebook.  Patient learns that body composition (ratio of muscle mass to fat mass) is a key component to assessing overall fitness, rather than body weight alone. Increased fat mass, especially visceral belly fat, can put [place] at increased risk for metabolic syndrome, type 2  diabetes, heart disease, and even death. It is recommended to combine diet and exercise (cardiovascular and resistance training) to improve your body composition. Seek guidance from your physician and exercise physiologist before implementing an exercise routine.  Exercise Action Plan Clinical staff conducted group or individual video education with verbal and written material and guidebook.  Patient learns the recommended strategies to achieve and enjoy long-term exercise adherence, including variety, self-motivation, self-efficacy, and positive decision making. Benefits of exercise include fitness, good health, weight management, more energy, better sleep, less stress, and overall well-being.  Medical   Heart Disease Risk Reduction Clinical staff conducted group or individual video education with verbal and written material and guidebook.  Patient learns our heart is our most vital organ as it circulates oxygen, nutrients, white blood cells, and hormones throughout the entire body, and carries waste away. Data supports a plant-based eating plan like the Pritikin Program for its effectiveness in slowing progression of and reversing heart disease. The video provides a number of recommendations to address heart disease.   Metabolic Syndrome and Belly Fat  Clinical staff conducted group or individual video education with verbal and written material and guidebook.  Patient learns what metabolic syndrome is, how it leads to heart disease, and how one can reverse it and keep it from coming back. You have metabolic syndrome if you have 3 of the following 5 criteria: abdominal obesity, high blood pressure, high triglycerides, low HDL cholesterol, and high blood sugar.  Hypertension and Heart Disease Clinical staff conducted group or individual video education with verbal and written material and guidebook.  Patient learns that high blood pressure, or hypertension, is very common in the [place].  Hypertension is largely due to excessive salt intake, but other important risk factors include being overweight, physical inactivity, drinking too much alcohol, smoking, and not eating enough potassium from fruits and vegetables. High blood pressure is a leading risk factor for heart attack, stroke, congestive heart failure, dementia, kidney failure, and premature death. Long-term effects of excessive salt intake include stiffening of the arteries and thickening of heart muscle and organ damage. Recommendations include ways to reduce hypertension and the risk of heart disease.  Diseases of Our Time - Focusing on Diabetes Clinical staff conducted group or individual video education with verbal and written material and guidebook.  Patient learns why the best way to stop diseases of our time is prevention, through food and other lifestyle changes. Medicine (such as prescription pills and surgeries) is often only a Band-Aid on the problem, not a long-term solution. Most common diseases of our time include obesity, type 2 diabetes, hypertension, heart disease, and cancer. The Pritikin Program is recommended and has been proven to help reduce, reverse, and/or prevent the damaging effects of metabolic syndrome.  Nutrition   Overview of the Pritikin Eating Plan  Clinical staff conducted group or individual video education with verbal and written material and guidebook.  Patient learns about the Pritikin Eating Plan for disease risk reduction. The Pritikin Eating Plan emphasizes a wide variety of unrefined, minimally-processed carbohydrates, like fruits, vegetables, whole grains, and legumes. Go, Caution, and Stop food choices are explained. Plant-based and lean animal proteins are emphasized. Rationale provided for low sodium intake for blood pressure control, low added sugars for blood sugar stabilization, and low added fats and oils for coronary artery disease risk reduction and weight management.  Calorie  Density  Clinical staff conducted group or individual video education with verbal and written material and guidebook.  Patient learns about calorie density and how it impacts the Pritikin Eating Plan. Knowing the characteristics of the food you choose will help you decide whether those foods will lead to weight gain or weight loss, and whether you want to consume more or less of them. Weight loss is usually a side effect of the Pritikin Eating Plan because of its focus on low calorie-dense foods.  Label Reading  Clinical staff conducted group or individual video education with verbal and written material and guidebook.  Patient learns about the Pritikin recommended label reading guidelines and corresponding recommendations regarding calorie density, added sugars, sodium content, and whole grains.  Dining Out - Part 1  Clinical staff conducted group or individual video education with verbal and written material and guidebook.  Patient learns that restaurant meals can be sabotaging because they can be so high in calories, fat, sodium, and/or sugar. Patient learns recommended strategies on how to positively address this and avoid unhealthy pitfalls.  Facts on Fats  Clinical staff conducted group or individual video education with verbal and written material and guidebook.  Patient learns that lifestyle modifications can be just as effective, if not more so, as many medications for lowering your risk of heart disease. A Pritikin lifestyle can help to reduce your risk of inflammation and atherosclerosis (cholesterol build-up, or plaque, in the artery walls). Lifestyle interventions such as dietary choices and physical activity address the cause of atherosclerosis. A review of the types of fats and their impact on blood cholesterol levels, along with dietary recommendations to reduce fat intake is also included.  Nutrition Action Plan  Clinical staff conducted group or individual video education with  verbal and written material and guidebook.  Patient learns how to incorporate Pritikin recommendations into their lifestyle. Recommendations include planning and keeping personal health goals in mind as an important part of their success.  Healthy Mind-Set    Healthy Minds, Bodies, Hearts  Clinical staff conducted group or individual video education with verbal and written material and guidebook.  Patient learns how to identify when they are stressed. Video will discuss the impact of that stress, as well as the many benefits of stress management. Patient will also be introduced to stress management techniques. The way we think, act, and feel has an impact on our hearts.  How Our Thoughts Can Heal Our Hearts  Clinical staff conducted group or individual video education with verbal and written material and guidebook.  Patient learns that negative thoughts can cause depression and anxiety. This can result in negative lifestyle behavior and serious health problems. Cognitive behavioral therapy is an effective method to help control our thoughts in order to change and improve our emotional outlook.  Additional Videos:  Exercise    Improving Performance  Clinical staff conducted group or individual video education with verbal and written material and guidebook.  Patient learns to use a non-linear approach by alternating intensity levels and lengths  of time spent exercising to help burn more calories and lose more body fat. Cardiovascular exercise helps improve heart health, metabolism, hormonal balance, blood sugar control, and recovery from fatigue. Resistance training improves strength, endurance, balance, coordination, reaction time, metabolism, and muscle mass. Flexibility exercise improves circulation, posture, and balance. Seek guidance from your physician and exercise physiologist before implementing an exercise routine and learn your capabilities and proper form for all exercise.  Introduction  to Yoga  Clinical staff conducted group or individual video education with verbal and written material and guidebook.  Patient learns about yoga, a discipline of the coming together of mind, breath, and body. The benefits of yoga include improved flexibility, improved range of motion, better posture and core strength, increased lung function, weight loss, and positive self-image. Yoga's heart health benefits include lowered blood pressure, healthier heart rate, decreased cholesterol and triglyceride levels, improved immune function, and reduced stress. Seek guidance from your physician and exercise physiologist before implementing an exercise routine and learn your capabilities and proper form for all exercise.  Medical   Aging: Enhancing Your Quality of Life  Clinical staff conducted group or individual video education with verbal and written material and guidebook.  Patient learns key strategies and recommendations to stay in good physical health and enhance quality of life, such as prevention strategies, having an advocate, securing a Health Care Proxy and Power of Attorney, and keeping a list of medications and system for tracking them. It also discusses how to avoid risk for bone loss.  Biology of Weight Control  Clinical staff conducted group or individual video education with verbal and written material and guidebook.  Patient learns that weight gain occurs because we consume more calories than we burn (eating more, moving less). Even if your body weight is normal, you may have higher ratios of fat compared to muscle mass. Too much body fat puts you at increased risk for cardiovascular disease, heart attack, stroke, type 2 diabetes, and obesity-related cancers. In addition to exercise, following the Pritikin Eating Plan can help reduce your risk.  Decoding Lab Results  Clinical staff conducted group or individual video education with verbal and written material and guidebook.  Patient learns  that lab test reflects one measurement whose values change over time and are influenced by many factors, including medication, stress, sleep, exercise, food, hydration, pre-existing medical conditions, and more. It is recommended to use the knowledge from this video to become more involved with your lab results and evaluate your numbers to speak with your doctor.   Diseases of Our Time - Overview  Clinical staff conducted group or individual video education with verbal and written material and guidebook.  Patient learns that according to the CDC, 50% to 70% of chronic diseases (such as obesity, type 2 diabetes, elevated lipids, hypertension, and heart disease) are avoidable through lifestyle improvements including healthier food choices, listening to satiety cues, and increased physical activity.  Sleep Disorders Clinical staff conducted group or individual video education with verbal and written material and guidebook.  Patient learns how good quality and duration of sleep are important to overall health and well-being. Patient also learns about sleep disorders and how they impact health along with recommendations to address them, including discussing with a physician.  Nutrition  Dining Out - Part 2 Clinical staff conducted group or individual video education with verbal and written material and guidebook.  Patient learns how to plan ahead and communicate in order to maximize their dining experience in a healthy and  nutritious manner. Included are recommended food choices based on the type of restaurant the patient is visiting.   Fueling a Banker conducted group or individual video education with verbal and written material and guidebook.  There is a strong connection between our food choices and our health. Diseases like obesity and type 2 diabetes are very prevalent and are in large-part due to lifestyle choices. The Pritikin Eating Plan provides plenty of food and  hunger-curbing satisfaction. It is easy to follow, affordable, and helps reduce health risks.  Menu Workshop  Clinical staff conducted group or individual video education with verbal and written material and guidebook.  Patient learns that restaurant meals can sabotage health goals because they are often packed with calories, fat, sodium, and sugar. Recommendations include strategies to plan ahead and to communicate with the manager, chef, or server to help order a healthier meal.  Planning Your Eating Strategy  Clinical staff conducted group or individual video education with verbal and written material and guidebook.  Patient learns about the Pritikin Eating Plan and its benefit of reducing the risk of disease. The Pritikin Eating Plan does not focus on calories. Instead, it emphasizes high-quality, nutrient-rich foods. By knowing the characteristics of the foods, we choose, we can determine their calorie density and make informed decisions.  Targeting Your Nutrition Priorities  Clinical staff conducted group or individual video education with verbal and written material and guidebook.  Patient learns that lifestyle habits have a tremendous impact on disease risk and progression. This video provides eating and physical activity recommendations based on your personal health goals, such as reducing LDL cholesterol, losing weight, preventing or controlling type 2 diabetes, and reducing high blood pressure.  Vitamins and Minerals  Clinical staff conducted group or individual video education with verbal and written material and guidebook.  Patient learns different ways to obtain key vitamins and minerals, including through a recommended healthy diet. It is important to discuss all supplements you take with your doctor.   Healthy Mind-Set    Smoking Cessation  Clinical staff conducted group or individual video education with verbal and written material and guidebook.  Patient learns that cigarette  smoking and tobacco addiction pose a serious health risk which affects millions of people. Stopping smoking will significantly reduce the risk of heart disease, lung disease, and many forms of cancer. Recommended strategies for quitting are covered, including working with your doctor to develop a successful plan.  Culinary   Becoming a Set designer conducted group or individual video education with verbal and written material and guidebook.  Patient learns that cooking at home can be healthy, cost-effective, quick, and puts them in control. Keys to cooking healthy recipes will include looking at your recipe, assessing your equipment needs, planning ahead, making it simple, choosing cost-effective seasonal ingredients, and limiting the use of added fats, salts, and sugars.  Cooking - Breakfast and Snacks  Clinical staff conducted group or individual video education with verbal and written material and guidebook.  Patient learns how important breakfast is to satiety and nutrition through the entire day. Recommendations include key foods to eat during breakfast to help stabilize blood sugar levels and to prevent overeating at meals later in the day. Planning ahead is also a key component.  Cooking - Educational psychologist conducted group or individual video education with verbal and written material and guidebook.  Patient learns eating strategies to improve overall health, including an approach to cook  more at home. Recommendations include thinking of animal protein as a side on your plate rather than center stage and focusing instead on lower calorie dense options like vegetables, fruits, whole grains, and plant-based proteins, such as beans. Making sauces in large quantities to freeze for later and leaving the skin on your vegetables are also recommended to maximize your experience.  Cooking - Healthy Salads and Dressing Clinical staff conducted group or individual video  education with verbal and written material and guidebook.  Patient learns that vegetables, fruits, whole grains, and legumes are the foundations of the Pritikin Eating Plan. Recommendations include how to incorporate each of these in flavorful and healthy salads, and how to create homemade salad dressings. Proper handling of ingredients is also covered. Cooking - Soups and [employer] - Soups and Desserts Clinical staff conducted group or individual video education with verbal and written material and guidebook.  Patient learns that Pritikin soups and desserts make for easy, nutritious, and delicious snacks and meal components that are low in sodium, fat, sugar, and calorie density, while high in vitamins, minerals, and filling fiber. Recommendations include simple and healthy ideas for soups and desserts.   Overview     The Pritikin Solution Program Overview Clinical staff conducted group or individual video education with verbal and written material and guidebook.  Patient learns that the results of the Pritikin Program have been documented in more than 100 articles published in peer-reviewed journals, and the benefits include reducing risk factors for (and, in some cases, even reversing) high cholesterol, high blood pressure, type 2 diabetes, obesity, and more! An overview of the three key pillars of the Pritikin Program will be covered: eating well, doing regular exercise, and having a healthy mind-set.  WORKSHOPS  Exercise: Exercise Basics: Building Your Action Plan Clinical staff led group instruction and group discussion with PowerPoint presentation and patient guidebook. To enhance the learning environment the use of posters, models and videos may be added. At the conclusion of this workshop, patients will comprehend the difference between physical activity and exercise, as well as the benefits of incorporating both, into their routine. Patients will understand the FITT (Frequency,  Intensity, Time, and Type) principle and how to use it to build an exercise action plan. In addition, safety concerns and other considerations for exercise and cardiac rehab will be addressed by the presenter. The purpose of this lesson is to promote a comprehensive and effective weekly exercise routine in order to improve patients' overall level of fitness.   Managing Heart Disease: Your Path to a Healthier Heart Clinical staff led group instruction and group discussion with PowerPoint presentation and patient guidebook. To enhance the learning environment the use of posters, models and videos may be added.At the conclusion of this workshop, patients will understand the anatomy and physiology of the heart. Additionally, they will understand how Pritikin's three pillars impact the risk factors, the progression, and the management of heart disease.  The purpose of this lesson is to provide a high-level overview of the heart, heart disease, and how the Pritikin lifestyle positively impacts risk factors.  Exercise Biomechanics Clinical staff led group instruction and group discussion with PowerPoint presentation and patient guidebook. To enhance the learning environment the use of posters, models and videos may be added. Patients will learn how the structural parts of their bodies function and how these functions impact their daily activities, movement, and exercise. Patients will learn how to promote a neutral spine, learn how to manage pain, and  identify ways to improve their physical movement in order to promote healthy living. The purpose of this lesson is to expose patients to common physical limitations that impact physical activity. Participants will learn practical ways to adapt and manage aches and pains, and to minimize their effect on regular exercise. Patients will learn how to maintain good posture while sitting, walking, and lifting.  Balance Training and Fall Prevention  Clinical  staff led group instruction and group discussion with PowerPoint presentation and patient guidebook. To enhance the learning environment the use of posters, models and videos may be added. At the conclusion of this workshop, patients will understand the importance of their sensorimotor skills (vision, proprioception, and the vestibular system) in maintaining their ability to balance as they age. Patients will apply a variety of balancing exercises that are appropriate for their current level of function. Patients will understand the common causes for poor balance, possible solutions to these problems, and ways to modify their physical environment in order to minimize their fall risk. The purpose of this lesson is to teach patients about the importance of maintaining balance as they age and ways to minimize their risk of falling.  WORKSHOPS   Nutrition:  Fueling a Ship broker led group instruction and group discussion with PowerPoint presentation and patient guidebook. To enhance the learning environment the use of posters, models and videos may be added. Patients will review the foundational principles of the Pritikin Eating Plan and understand what constitutes a serving size in each of the food groups. Patients will also learn Pritikin-friendly foods that are better choices when away from home and review make-ahead meal and snack options. Calorie density will be reviewed and applied to three nutrition priorities: weight maintenance, weight loss, and weight gain. The purpose of this lesson is to reinforce (in a group setting) the key concepts around what patients are recommended to eat and how to apply these guidelines when away from home by planning and selecting Pritikin-friendly options. Patients will understand how calorie density may be adjusted for different weight management goals.  Mindful Eating  Clinical staff led group instruction and group discussion with PowerPoint  presentation and patient guidebook. To enhance the learning environment the use of posters, models and videos may be added. Patients will briefly review the concepts of the Pritikin Eating Plan and the importance of low-calorie dense foods. The concept of mindful eating will be introduced as well as the importance of paying attention to internal hunger signals. Triggers for non-hunger eating and techniques for dealing with triggers will be explored. The purpose of this lesson is to provide patients with the opportunity to review the basic principles of the Pritikin Eating Plan, discuss the value of eating mindfully and how to measure internal cues of hunger and fullness using the Hunger Scale. Patients will also discuss reasons for non-hunger eating and learn strategies to use for controlling emotional eating.  Targeting Your Nutrition Priorities Clinical staff led group instruction and group discussion with PowerPoint presentation and patient guidebook. To enhance the learning environment the use of posters, models and videos may be added. Patients will learn how to determine their genetic susceptibility to disease by reviewing their family history. Patients will gain insight into the importance of diet as part of an overall healthy lifestyle in mitigating the impact of genetics and other environmental insults. The purpose of this lesson is to provide patients with the opportunity to assess their personal nutrition priorities by looking at their family history, their  own health history and current risk factors. Patients will also be able to discuss ways of prioritizing and modifying the Pritikin Eating Plan for their highest risk areas  Menu  Clinical staff led group instruction and group discussion with PowerPoint presentation and patient guidebook. To enhance the learning environment the use of posters, models and videos may be added. Using menus brought in from [employer], or printed from [employer], patients will apply the Pritikin dining out guidelines that were presented in the [employer] video. Patients will also be able to practice these guidelines in a variety of provided scenarios. The purpose of this lesson is to provide patients with the opportunity to practice hands-on learning of the Pritikin Dining Out guidelines with actual menus and practice scenarios.  Label Reading Clinical staff led group instruction and group discussion with PowerPoint presentation and patient guidebook. To enhance the learning environment the use of posters, models and videos may be added. Patients will review and discuss the Pritikin label reading guidelines presented in [employer]'s Label Reading Educational series video. Using fool labels brought in from local grocery stores and markets, patients will apply the label reading guidelines and determine if the packaged food meet the Pritikin guidelines. The purpose of this lesson is to provide patients with the opportunity to review, discuss, and practice hands-on learning of the Pritikin Label Reading guidelines with actual packaged food labels. Cooking School  Pritik[employer]'s [employer] are designed to teach patients ways to prepare quick, simple, and affordable recipes at home. The importance of nutrition's role in chronic disease risk reduction is reflected in its emphasis in the overall Pritikin program. By learning how to prepare essential core Pritikin Eating Plan recipes, patients will increase control over what they eat; be able to customize the flavor of foods without the use of added salt, sugar, or fat; and improve the quality of the food they consume. By learning a set of core recipes which are easily assembled, quickly prepared, and affordable, patients are more likely to prepare more healthy foods at home. These workshops focus on convenient breakfasts, simple entres, side dishes, and desserts which can be prepared with  minimal effort and are consistent with nutrition recommendations for cardiovascular risk reduction. Cooking [employer] are taught by a Armed forces logistics/support/administrative officer (RD) who has been trained by the [employer]. The chef or RD has a clear understanding of the importance of minimizing - if not completely eliminating - added fat, sugar, and sodium in recipes. Throughout the series of Cooking School Workshop sessions, patients will learn about healthy ingredients and efficient methods of cooking to build confidence in their capability to prepare    Cooking School weekly topics:  Adding Flavor- Sodium-Free  Fast and Healthy Breakfasts  Powerhouse Plant-Based Proteins  Satisfying Salads and Dressings  Simple Sides and Sauces  International Cuisine-Spotlight on the [employer] Zones  Delicious Desserts  Savory Soups  [employer] - Meals in a Astronomer Appetizers and Snacks  Comforting Weekend Breakfasts  One-Pot Wonders   Fast Evening Meals  Landscape architect Your Pritikin Plate  WORKSHOPS   Healthy Mindset (Psychosocial):  Focused Goals, Sustainable Changes Clinical staff led group instruction and group discussion with PowerPoint presentation and patient guidebook. To enhance the learning environment the use of posters, models and videos may be added. Patients will be able to apply effective goal setting strategies to establish at least one personal goal, and then take consistent, meaningful action  toward that goal. They will learn to identify common barriers to achieving personal goals and develop strategies to overcome them. Patients will also gain an understanding of how our mind-set can impact our ability to achieve goals and the importance of cultivating a positive and growth-oriented mind-set. The purpose of this lesson is to provide patients with a deeper understanding of how to set and achieve personal goals, as well as the tools and strategies needed  to overcome common obstacles which may arise along the way.  From Head to Heart: The Power of a Healthy Outlook  Clinical staff led group instruction and group discussion with PowerPoint presentation and patient guidebook. To enhance the learning environment the use of posters, models and videos may be added. Patients will be able to recognize and describe the impact of emotions and mood on physical health. They will discover the importance of self-care and explore self-care practices which may work for them. Patients will also learn how to utilize the 4 C's to cultivate a healthier outlook and better manage stress and challenges. The purpose of this lesson is to demonstrate to patients how a healthy outlook is an essential part of maintaining good health, especially as they continue their cardiac rehab journey.  Healthy Sleep for a Healthy Heart Clinical staff led group instruction and group discussion with PowerPoint presentation and patient guidebook. To enhance the learning environment the use of posters, models and videos may be added. At the conclusion of this workshop, patients will be able to demonstrate knowledge of the importance of sleep to overall health, well-being, and quality of life. They will understand the symptoms of, and treatments for, common sleep disorders. Patients will also be able to identify daytime and nighttime behaviors which impact sleep, and they will be able to apply these tools to help manage sleep-related challenges. The purpose of this lesson is to provide patients with a general overview of sleep and outline the importance of quality sleep. Patients will learn about a few of the most common sleep disorders. Patients will also be introduced to the concept of "sleep hygiene," and discover ways to self-manage certain sleeping problems through simple daily behavior changes. Finally, the workshop will motivate patients by clarifying the links between quality sleep and their  goals of heart-healthy living.   Recognizing and Reducing Stress Clinical staff led group instruction and group discussion with PowerPoint presentation and patient guidebook. To enhance the learning environment the use of posters, models and videos may be added. At the conclusion of this workshop, patients will be able to understand the types of stress reactions, differentiate between acute and chronic stress, and recognize the impact that chronic stress has on their health. They will also be able to apply different coping mechanisms, such as reframing negative self-talk. Patients will have the opportunity to practice a variety of stress management techniques, such as deep abdominal breathing, progressive muscle relaxation, and/or guided imagery.  The purpose of this lesson is to educate patients on the role of stress in their lives and to provide healthy techniques for coping with it.  Learning Barriers/Preferences:  Learning Barriers/Preferences - 02/01/23 1336       Learning Barriers/Preferences   Learning Barriers Sight    Learning Preferences Computer/Internet;Pictoral;Video             Education Topics:  Knowledge Questionnaire Score:  Knowledge Questionnaire Score - 02/01/23 1335       Knowledge Questionnaire Score   Pre Score 21/24  Core Components/Risk Factors/Patient Goals at Admission:  Personal Goals and Risk Factors at Admission - 02/01/23 1335       Core Components/Risk Factors/Patient Goals on Admission   Hypertension Yes    Intervention Provide education on lifestyle modifcations including regular physical activity/exercise, weight management, moderate sodium restriction and increased consumption of fresh fruit, vegetables, and low fat dairy, alcohol moderation, and smoking cessation.;Monitor prescription use compliance.    Expected Outcomes Short Term: Continued assessment and intervention until BP is < 140/20mm HG in hypertensive participants. <  130/23mm HG in hypertensive participants with diabetes, heart failure or chronic kidney disease.;Long Term: Maintenance of blood pressure at goal levels.    Lipids Yes    Intervention Provide education and support for participant on nutrition & aerobic/resistive exercise along with prescribed medications to achieve LDL 70mg , HDL >40mg .    Expected Outcomes Short Term: Participant states understanding of desired cholesterol values and is compliant with medications prescribed. Participant is following exercise prescription and nutrition guidelines.;Long Term: Cholesterol controlled with medications as prescribed, with individualized exercise RX and with personalized nutrition plan. Value goals: LDL < 70mg , HDL > 40 mg.             Core Components/Risk Factors/Patient Goals Review:   Goals and Risk Factor Review     Row Name 02/08/23 0910 03/08/23 1126           Core Components/Risk Factors/Patient Goals Review   Personal Goals Review Weight Management/Obesity;Hypertension;Lipids Weight Management/Obesity;Hypertension;Lipids      Review Arkesha started intensive cardiac rehab on 02/08/23 and did well with exercise. Vital signs were stable Gerelene continues to do well with exercise. She stated she feels she is improving. Vital signs were stable      Expected Outcomes Kristilyn will continue to participate in intensive cardiac rehab for exercise, nutrition and lifestyle modifications Britteny will continue to participate in intensive cardiac rehab for exercise, nutrition and lifestyle modifications               Core Components/Risk Factors/Patient Goals at Discharge (Final Review):   Goals and Risk Factor Review - 03/08/23 1126       Core Components/Risk Factors/Patient Goals Review   Personal Goals Review Weight Management/Obesity;Hypertension;Lipids    Review Tai continues to do well with exercise. She stated she feels she is improving. Vital signs were stable    Expected  Outcomes Otylia will continue to participate in intensive cardiac rehab for exercise, nutrition and lifestyle modifications             ITP Comments:  ITP Comments     Row Name 02/01/23 1035 02/08/23 0909 03/08/23 1124       ITP Comments Armanda Magic, MD:  Medical Director.  Introduction to the [employer] / Intensive Cardiac Rehab.  Initial orientation packet reviewed with the patient. 30 Day ITP Review. Dalany started intensive cardiac rehab on 02/08/23 and did well with exercise 30 Day ITP Review. Allene has good participation and attendance in cardiac rehab.              Comments: See ITP comments.

## 2023-03-09 ENCOUNTER — Encounter (HOSPITAL_COMMUNITY)
Admission: RE | Admit: 2023-03-09 | Discharge: 2023-03-09 | Disposition: A | Discharge status: Home / Self Care | Payer: Medicare Other | Source: Ambulatory Visit | Attending: Cardiology | Admitting: Cardiology

## 2023-03-09 DIAGNOSIS — Z955 Presence of coronary angioplasty implant and graft: Secondary | ICD-10-CM | POA: Diagnosis not present

## 2023-03-11 ENCOUNTER — Encounter (HOSPITAL_COMMUNITY)
Admission: RE | Admit: 2023-03-11 | Discharge: 2023-03-11 | Disposition: A | Discharge status: Home / Self Care | Payer: Medicare Other | Source: Ambulatory Visit | Attending: Cardiology | Admitting: Cardiology

## 2023-03-11 DIAGNOSIS — Z955 Presence of coronary angioplasty implant and graft: Secondary | ICD-10-CM

## 2023-03-11 NOTE — Progress Notes (Signed)
Reviewed home exercise Rx with patient today.  Encouraged warm-up, cool-down, and stretching. Reviewed THRR of 59 - 118 and keeping RPE between 11-13. Encouraged to hydrate with activity.  ?Reviewed weather parameters for temperature and humidity for safe exercise outdoors. Reviewed S/S to terminate exercise and when to call 911 vs MD. Reviewed the use of NTG and pt was encouraged to carry at all times. Pt encouraged to always carry a cell phone for safety when exercising outdoors. Pt verbalized understanding of the home exercise Rx and was provided a copy.  ? ?Amy Berzins MS, ACSM-CEP, CCRP  ?

## 2023-03-13 ENCOUNTER — Other Ambulatory Visit: Payer: Self-pay | Admitting: Cardiology

## 2023-03-13 DIAGNOSIS — I2510 Atherosclerotic heart disease of native coronary artery without angina pectoris: Secondary | ICD-10-CM

## 2023-03-13 DIAGNOSIS — Z95820 Peripheral vascular angioplasty status with implants and grafts: Secondary | ICD-10-CM

## 2023-03-13 DIAGNOSIS — I700 Atherosclerosis of aorta: Secondary | ICD-10-CM

## 2023-03-13 MED ORDER — ATORVASTATIN CALCIUM 80 MG PO TABS
80.0000 mg | ORAL_TABLET | Freq: Every day | ORAL | 3 refills | Status: DC
Start: 2023-03-13 — End: 2023-04-25

## 2023-03-13 MED ORDER — FENOFIBRATE 145 MG PO TABS
145.0000 mg | ORAL_TABLET | Freq: Every day | ORAL | 0 refills | Status: DC
Start: 2023-03-13 — End: 2023-04-25

## 2023-03-14 ENCOUNTER — Encounter (HOSPITAL_COMMUNITY)
Admission: RE | Admit: 2023-03-14 | Discharge: 2023-03-14 | Disposition: A | Discharge status: Home / Self Care | Payer: Medicare Other | Source: Ambulatory Visit | Attending: Cardiology | Admitting: Cardiology

## 2023-03-14 DIAGNOSIS — Z955 Presence of coronary angioplasty implant and graft: Secondary | ICD-10-CM | POA: Diagnosis not present

## 2023-03-16 ENCOUNTER — Encounter (HOSPITAL_COMMUNITY)
Admission: RE | Admit: 2023-03-16 | Discharge: 2023-03-16 | Disposition: A | Discharge status: Home / Self Care | Payer: Medicare Other | Source: Ambulatory Visit | Attending: Cardiology | Admitting: Cardiology

## 2023-03-16 DIAGNOSIS — Z955 Presence of coronary angioplasty implant and graft: Secondary | ICD-10-CM

## 2023-03-18 ENCOUNTER — Encounter (HOSPITAL_COMMUNITY)
Admission: RE | Admit: 2023-03-18 | Discharge: 2023-03-18 | Disposition: A | Discharge status: Home / Self Care | Payer: Medicare Other | Source: Ambulatory Visit | Attending: Cardiology | Admitting: Cardiology

## 2023-03-18 DIAGNOSIS — Z955 Presence of coronary angioplasty implant and graft: Secondary | ICD-10-CM | POA: Diagnosis not present

## 2023-03-21 ENCOUNTER — Telehealth: Payer: Self-pay

## 2023-03-21 ENCOUNTER — Encounter (HOSPITAL_COMMUNITY)
Admission: RE | Admit: 2023-03-21 | Discharge: 2023-03-21 | Disposition: A | Discharge status: Home / Self Care | Payer: Medicare Other | Source: Ambulatory Visit | Attending: Cardiology | Admitting: Cardiology

## 2023-03-21 DIAGNOSIS — Z955 Presence of coronary angioplasty implant and graft: Secondary | ICD-10-CM | POA: Diagnosis not present

## 2023-03-21 NOTE — Telephone Encounter (Signed)
Patient stopped taking fenofibrate and atorvastatin due to chest pain and nausea.She is unsure which medication caused the symptoms. She restarted the atorvastatin at previous dose of  40mg  and is experiencing no problems.

## 2023-03-23 ENCOUNTER — Encounter (HOSPITAL_COMMUNITY)
Admission: RE | Admit: 2023-03-23 | Discharge: 2023-03-23 | Disposition: A | Discharge status: Home / Self Care | Payer: Medicare Other | Source: Ambulatory Visit | Attending: Cardiology | Admitting: Cardiology

## 2023-03-23 DIAGNOSIS — Z955 Presence of coronary angioplasty implant and graft: Secondary | ICD-10-CM

## 2023-03-23 NOTE — Telephone Encounter (Signed)
That is fine. Continue Lipitor 40 mg p.o. nightly. Please have her get fasting lipids which were already ordered prior to the next office visit to reevaluate therapy and plan of care.  Amy Kerr [place], DO, [facility]

## 2023-03-24 NOTE — Telephone Encounter (Signed)
As long as she is doing fine no need to come in sooner. But do make a better effort with regards to lifestyle changes and reducing foods that are high in triglycerides and cholesterol.  She can have the labs done 48 hours prior to the next office visit and we can discuss the results at the upcoming visit.  Nonie Lochner [place], DO, [facility]

## 2023-03-24 NOTE — Telephone Encounter (Signed)
Patient is asking if should she come in sooner that her October appointment? Also wants to know if she should have labs sooner.

## 2023-03-25 ENCOUNTER — Encounter (HOSPITAL_COMMUNITY)
Admission: RE | Admit: 2023-03-25 | Discharge: 2023-03-25 | Disposition: A | Discharge status: Home / Self Care | Payer: Medicare Other | Source: Ambulatory Visit | Attending: Cardiology | Admitting: Cardiology

## 2023-03-25 DIAGNOSIS — Z955 Presence of coronary angioplasty implant and graft: Secondary | ICD-10-CM | POA: Diagnosis not present

## 2023-03-28 ENCOUNTER — Encounter (HOSPITAL_COMMUNITY)
Admission: RE | Admit: 2023-03-28 | Discharge: 2023-03-28 | Disposition: A | Discharge status: Home / Self Care | Payer: Medicare Other | Source: Ambulatory Visit | Attending: Cardiology | Admitting: Cardiology

## 2023-03-28 DIAGNOSIS — Z955 Presence of coronary angioplasty implant and graft: Secondary | ICD-10-CM | POA: Diagnosis not present

## 2023-03-28 DIAGNOSIS — E782 Mixed hyperlipidemia: Secondary | ICD-10-CM | POA: Diagnosis not present

## 2023-03-28 DIAGNOSIS — J309 Allergic rhinitis, unspecified: Secondary | ICD-10-CM | POA: Diagnosis not present

## 2023-03-28 DIAGNOSIS — M25473 Effusion, unspecified ankle: Secondary | ICD-10-CM | POA: Diagnosis not present

## 2023-03-29 NOTE — Telephone Encounter (Signed)
Patient is only taking half of fenofibrate now per PCP recommendation.She agrees to have labs performed 48 hours prior to office visit.

## 2023-03-30 ENCOUNTER — Encounter (HOSPITAL_COMMUNITY)
Admission: RE | Admit: 2023-03-30 | Discharge: 2023-03-30 | Disposition: A | Discharge status: Home / Self Care | Payer: Medicare Other | Source: Ambulatory Visit | Attending: Cardiology | Admitting: Cardiology

## 2023-03-30 DIAGNOSIS — Z955 Presence of coronary angioplasty implant and graft: Secondary | ICD-10-CM | POA: Diagnosis not present

## 2023-04-01 ENCOUNTER — Encounter (HOSPITAL_COMMUNITY)
Admission: RE | Admit: 2023-04-01 | Discharge: 2023-04-01 | Disposition: A | Discharge status: Home / Self Care | Payer: Medicare Other | Source: Ambulatory Visit | Attending: Cardiology | Admitting: Cardiology

## 2023-04-01 DIAGNOSIS — Z955 Presence of coronary angioplasty implant and graft: Secondary | ICD-10-CM | POA: Diagnosis not present

## 2023-04-04 ENCOUNTER — Encounter (HOSPITAL_COMMUNITY)
Admission: RE | Admit: 2023-04-04 | Discharge: 2023-04-04 | Disposition: A | Discharge status: Home / Self Care | Payer: Medicare Other | Source: Ambulatory Visit | Attending: Cardiology | Admitting: Cardiology

## 2023-04-04 DIAGNOSIS — Z955 Presence of coronary angioplasty implant and graft: Secondary | ICD-10-CM | POA: Diagnosis not present

## 2023-04-05 NOTE — Progress Notes (Signed)
Cardiac Individual Treatment Plan  Patient Details  Name: Amy Kerr MRN: [id number] Date of Birth: January 27, 1951 Referring Provider:   Flowsheet Row INTENSIVE CARDIAC REHAB ORIENT from 02/01/2023 in [facility]  Referring Provider Tessa Lerner, MD       Initial Encounter Date:  Flowsheet Row INTENSIVE CARDIAC REHAB ORIENT from 02/01/2023 in [facility]  Date 02/01/23       Visit Diagnosis: 12/21/22 Status post coronary artery stent placement OM1  Patient's Home Medications on Admission:  Current Outpatient Medications:    amLODipine (NORVASC) 10 MG tablet, Take 1 tablet (10 mg total) by mouth every morning. (Patient taking differently: Take 10 mg by mouth daily.), Disp: 90 tablet, Rfl: 0   aspirin EC 81 MG tablet, Take 1 tablet (81 mg total) by mouth daily. Swallow whole., Disp: 90 tablet, Rfl: 3   atorvastatin (LIPITOR) 80 MG tablet, Take 1 tablet (80 mg total) by mouth daily., Disp: 90 tablet, Rfl: 3   Cholecalciferol (VITAMIN D-3) 25 MCG (1000 UT) CAPS, Take 2,000 Units by mouth daily., Disp: , Rfl:    fenofibrate (TRICOR) 145 MG tablet, Take 1 tablet (145 mg total) by mouth daily., Disp: 90 tablet, Rfl: 0   metoprolol succinate (TOPROL XL) 25 MG 24 hr tablet, Take 1 tablet (25 mg total) by mouth every morning. Hold if systolic blood pressure (top number) less than 100 mmHg or pulse less than 60 bpm., Disp: 30 tablet, Rfl: 11   nitroGLYCERIN (NITROSTAT) 0.4 MG SL tablet, Place 1 tablet (0.4 mg total) under the tongue every 5 (five) minutes as needed for chest pain. If you require more than two tablets five minutes apart go to the nearest ER via EMS., Disp: 30 tablet, Rfl: 0   ticagrelor (BRILINTA) 90 MG TABS tablet, Take 1 tablet (90 mg total) by mouth 2 (two) times daily., Disp: 180 tablet, Rfl: 3  Past Medical History: Past Medical History:  Diagnosis Date    Coronary artery disease    Hyperlipidemia    Hypertension     Tobacco Use: Social History   Tobacco Use  Smoking Status Never  Smokeless Tobacco Never    Labs: Review Flowsheet       Latest Ref Rng & Units 03/03/2023  Labs for ITP Cardiac and Pulmonary Rehab  Cholestrol 100 - 199 mg/dL 409   LDL (calc) 0 - 99 mg/dL 80   Direct LDL 0 - 99 mg/dL 72   HDL-C >81 mg/dL 38   Trlycerides 0 - 191 mg/dL 478     Capillary Blood Glucose: No results found for: "GLUCAP"   Exercise Target Goals: Exercise Program Goal: Individual exercise prescription set using results from initial 6 min walk test and THRR while considering  patient's activity barriers and safety.   Exercise Prescription Goal: Initial exercise prescription builds to 30-45 minutes a day of aerobic activity, 2-3 days per week.  Home exercise guidelines will be given to patient during program as part of exercise prescription that the participant will acknowledge.  Activity Barriers & Risk Stratification:  Activity Barriers & Cardiac Risk Stratification - 02/01/23 1330       Activity Barriers & Cardiac Risk Stratification   Activity Barriers Neck/Spine Problems;Arthritis;Back Problems;Joint Problems;Deconditioning    Cardiac Risk Stratification High             6 Minute Walk:  6 Minute Walk     Row  Name 02/01/23 1123         6 Minute Walk   Phase Initial     Distance 1164 feet     Walk Time 6 minutes     # of Rest Breaks 0     MPH 2.2     METS 2.42     RPE 8     Perceived Dyspnea  0     VO2 Peak 8.5     Symptoms No     Resting HR 70 bpm     Resting BP 118/78     Resting Oxygen Saturation  98 %     Exercise Oxygen Saturation  during 6 min walk 98 %     Max Ex. HR 94 bpm     Max Ex. BP 144/76     2 Minute Post BP 124/70              Oxygen Initial Assessment:   Oxygen Re-Evaluation:   Oxygen Discharge (Final Oxygen Re-Evaluation):   Initial Exercise Prescription:  Initial  Exercise Prescription - 02/01/23 1300       Date of Initial Exercise RX and Referring Provider   Date 02/01/23    Referring Provider Tessa Lerner, MD    Expected Discharge Date 04/15/23      NuStep   Level 2    SPM 75    Minutes 15    METs 2.4      Track   Laps 8    Minutes 15    METs 2.4      Prescription Details   Frequency (times per week) 3    Duration Progress to 30 minutes of continuous aerobic without signs/symptoms of physical distress      Intensity   THRR 40-80% of Max Heartrate 59-118    Ratings of Perceived Exertion 11-13    Perceived Dyspnea 0-4      Progression   Progression Continue progressive overload as per policy without signs/symptoms or physical distress.      Resistance Training   Training Prescription Yes    Weight 2 lbs    Reps 10-15             Perform Capillary Blood Glucose checks as needed.  Exercise Prescription Changes:   Exercise Prescription Changes     Row Name 02/07/23 1100 02/23/23 1400 03/09/23 1532 03/11/23 1000 03/30/23 1400     Response to Exercise   Blood Pressure (Admit) 118/64 114/72 120/70 124/78 118/68   Blood Pressure (Exercise) 150/80 128/82 130/66 128/62 148/60   Blood Pressure (Exit) 110/70 110/60 106/64 110/62 98/56   Heart Rate (Admit) 77 bpm 67 bpm 78 bpm 69 bpm 73 bpm   Heart Rate (Exercise) 106 bpm 111 bpm 107 bpm 112 bpm 113 bpm   Heart Rate (Exit) 85 bpm 78 bpm 83 bpm 79 bpm 82 bpm   Rating of Perceived Exertion (Exercise) 11 9 9 11  --   Symptoms None None None None None   Comments Pt's first day in the CRP2 program Reviewed METs Reviewed METs and goals Reviewed home exercise Rx Reviewed METs and goals   Duration Continue with 30 min of aerobic exercise without signs/symptoms of physical distress. Continue with 30 min of aerobic exercise without signs/symptoms of physical distress. Continue with 30 min of aerobic exercise without signs/symptoms of physical distress. Continue with 30 min of aerobic  exercise without signs/symptoms of physical distress. Continue with 30 min of aerobic exercise without signs/symptoms of physical distress.  Intensity THRR unchanged THRR unchanged THRR unchanged THRR unchanged THRR unchanged     Progression   Progression Continue to progress workloads to maintain intensity without signs/symptoms of physical distress. Continue to progress workloads to maintain intensity without signs/symptoms of physical distress. Continue to progress workloads to maintain intensity without signs/symptoms of physical distress. Continue to progress workloads to maintain intensity without signs/symptoms of physical distress. Continue to progress workloads to maintain intensity without signs/symptoms of physical distress.   Average METs 2.57 3.13 3.2 3.2 3.33     Resistance Training   Training Prescription Yes No No Yes No   Weight 2 lbs No weights on wednesdays No weights on wednesdays 2 lb wts No weights on Wednesday   Reps 10-15 10-15 -- -- --   Time 10 Minutes 10 Minutes -- -- --     Interval Training   Interval Training No No No No No     NuStep   Level 2 2 2 2 4    SPM 75 -- 91 90 91   Minutes 15 15 15 15 15    METs 2.1 2.7 2.9 2.8 3.1     Track   Laps 16 20 20 20 20    Minutes 15 15 15 15 15    METs 3.04 3.55 3.55 3.55 3.55     Home Exercise Plan   Plans to continue exercise at -- -- -- Home (comment) Home (comment)   Frequency -- -- -- Add 4 additional days to program exercise sessions. Add 4 additional days to program exercise sessions.   Initial Home Exercises Provided -- -- -- 03/11/23 03/11/23            Exercise Comments:   Exercise Comments     Row Name 02/07/23 1109 02/23/23 1403 03/09/23 1536 03/11/23 1046 03/30/23 1430   Exercise Comments Pt's first day in the CRP2 program. Pt exercised without complaint and is off to a good start. Reviewed METs. Pt is making good progress in the program. No complaints. Reviewed METs and goals. Pt continues to  progess in the cRP2 program. will consider increase on nsutep next session. Reviewed home exercise Rx with patient today. Pt voices walking daily at home for 15-20 minutes with her spouse. Encouraged patient to continue walking but increase to 30 minutes at least 2x/week to achieve 150 minutes of exercise per week. Pt verablaized understanding of the home exercise Rx and was provided a copy. Reviewed METs and goals. Pt continues to progress in the CRP2 program.            Exercise Goals and Review:   Exercise Goals     Row Name 02/01/23 1331             Exercise Goals   Increase Physical Activity Yes       Intervention Provide advice, education, support and counseling about physical activity/exercise needs.;Develop an individualized exercise prescription for aerobic and resistive training based on initial evaluation findings, risk stratification, comorbidities and participant's personal goals.       Expected Outcomes Short Term: Attend rehab on a regular basis to increase amount of physical activity.;Long Term: Add in home exercise to make exercise part of routine and to increase amount of physical activity.;Long Term: Exercising regularly at least 3-5 days a week.       Increase Strength and Stamina Yes       Intervention Provide advice, education, support and counseling about physical activity/exercise needs.;Develop an individualized exercise prescription for aerobic and resistive training based  on initial evaluation findings, risk stratification, comorbidities and participant's personal goals.       Expected Outcomes Short Term: Increase workloads from initial exercise prescription for resistance, speed, and METs.;Short Term: Perform resistance training exercises routinely during rehab and add in resistance training at home;Long Term: Improve cardiorespiratory fitness, muscular endurance and strength as measured by increased METs and functional capacity ( )       Able to understand  and use rate of perceived exertion (RPE) scale Yes       Intervention Provide education and explanation on how to use RPE scale       Expected Outcomes Short Term: Able to use RPE daily in rehab to express subjective intensity level;Long Term:  Able to use RPE to guide intensity level when exercising independently       Knowledge and understanding of Target Heart Rate Range (THRR) Yes       Intervention Provide education and explanation of THRR including how the numbers were predicted and where they are located for reference       Expected Outcomes Short Term: Able to state/look up THRR;Long Term: Able to use THRR to govern intensity when exercising independently;Short Term: Able to use daily as guideline for intensity in rehab       Understanding of Exercise Prescription Yes       Intervention Provide education, explanation, and written materials on patient's individual exercise prescription       Expected Outcomes Short Term: Able to explain program exercise prescription;Long Term: Able to explain home exercise prescription to exercise independently                Exercise Goals Re-Evaluation :  Exercise Goals Re-Evaluation     Row Name 02/07/23 1107 03/09/23 1534 03/30/23 1429         Exercise Goal Re-Evaluation   Exercise Goals Review Increase Physical Activity;Increase Strength and Stamina;Able to understand and use rate of perceived exertion (RPE) scale;Knowledge and understanding of Target Heart Rate Range (THRR);Understanding of Exercise Prescription Increase Physical Activity;Increase Strength and Stamina;Able to understand and use rate of perceived exertion (RPE) scale;Knowledge and understanding of Target Heart Rate Range (THRR);Understanding of Exercise Prescription Increase Physical Activity;Increase Strength and Stamina;Able to understand and use rate of perceived exertion (RPE) scale;Knowledge and understanding of Target Heart Rate Range (THRR);Understanding of Exercise  Prescription     Comments Pt's first day in the CRP2 program. Pt understands the exercise Rx, RPE scale, and THRR. Reviewed METs and goals. Pt voices she is making progress on her goals, in fact pt voices she is exceeding her goals of improved diet and imrpoved strnegth and stamina. Peak METs are 3.93. Reviewed METs and goals. Pt continues to voice she is making progress on her goals of improved strengh and stamina and improved nutrition. Peak METs are 3.93.     Expected Outcomes Will continue to monitor patient and progress exercise workloads as tolerated. Will continue to monitor patient and progress exercise workloads as tolerated. Will continue to monitor patient and progress exercise workloads as tolerated.              Discharge Exercise Prescription (Final Exercise Prescription Changes):  Exercise Prescription Changes - 03/30/23 1400       Response to Exercise   Blood Pressure (Admit) 118/68    Blood Pressure (Exercise) 148/60    Blood Pressure (Exit) 98/56    Heart Rate (Admit) 73 bpm    Heart Rate (Exercise) 113 bpm    Heart Rate (  Exit) 82 bpm    Symptoms None    Comments Reviewed METs and goals    Duration Continue with 30 min of aerobic exercise without signs/symptoms of physical distress.    Intensity THRR unchanged      Progression   Progression Continue to progress workloads to maintain intensity without signs/symptoms of physical distress.    Average METs 3.33      Resistance Training   Training Prescription No    Weight No weights on Wednesday      Interval Training   Interval Training No      NuStep   Level 4    SPM 91    Minutes 15    METs 3.1      Track   Laps 20    Minutes 15    METs 3.55      Home Exercise Plan   Plans to continue exercise at Home (comment)    Frequency Add 4 additional days to program exercise sessions.    Initial Home Exercises Provided 03/11/23             Nutrition:  Target Goals: Understanding of nutrition  guidelines, daily intake of sodium 1500mg , cholesterol 200mg , calories 30% from fat and 7% or less from saturated fats, daily to have 5 or more servings of fruits and vegetables.  Biometrics:  Pre Biometrics - 02/01/23 1024       Pre Biometrics   Waist Circumference 37.75 inches    Hip Circumference 44 inches    Waist to Hip Ratio 0.86 %    Triceps Skinfold 30 mm    % Body Fat 41.7 %    Grip Strength 33 kg    Flexibility 17.5 in    Single Leg Stand 30 seconds              Nutrition Therapy Plan and Nutrition Goals:  Nutrition Therapy & Goals - 04/04/23 0928       Nutrition Therapy   Diet heart healthy diet    Drug/Food Interactions Statins/Certain Fruits      Personal Nutrition Goals   Nutrition Goal Patient to identify strategies for reducing cardiovascular risk by attending the [employer] education and nutrition series weekly.    Personal Goal #2 Patient to improve diet quality by using the plate method as a guide for meal planning to include lean protein/plant protein, fruits, vegetables, whole grains, nonfat dairy as part of a well-balanced diet.    Personal Goal #3 Patient to limit sodium intake 1500mg  per day    Comments Goals in action. Jahna continues to attend the [employer] education and nutrition series regularly. Niemah reports that she has started making many dietary changes including reduced sodium, increased dietary fiber, reduced saturated fat, and reading food labels. Her husband is very supportive as well. She is down 4.4# since starting with our program. Triglycerides remain elevated; she denies alcohol intake, denies refined carbohydrates/simple sugar,etc; lipids managed by PCP.  Patient will benefit from participation in intensive cardiac rehab for nutrition, exercise, and lifestyle modification.      Intervention Plan   Intervention Prescribe, educate and counsel regarding individualized specific dietary modifications aiming towards targeted core  components such as weight, hypertension, lipid management, diabetes, heart failure and other comorbidities.;Nutrition handout(s) given to patient.    Expected Outcomes Short Term Goal: Understand basic principles of dietary content, such as calories, fat, sodium, cholesterol and nutrients.;Long Term Goal: Adherence to prescribed nutrition plan.  Nutrition Assessments:  Nutrition Assessments - 02/07/23 0855       Rate Your Plate Scores   Pre Score 76            MEDIFICTS Score Key: ?70 Need to make dietary changes  40-70 Heart Healthy Diet ? 40 Therapeutic Level Cholesterol Diet   Flowsheet Row INTENSIVE CARDIAC REHAB from 02/07/2023 in [facility]  Picture Your Plate Total Score on Admission 76      Picture Your Plate Scores: <40 Unhealthy dietary pattern with much room for improvement. 41-50 Dietary pattern unlikely to meet recommendations for good health and room for improvement. 51-60 More healthful dietary pattern, with some room for improvement.  >60 Healthy dietary pattern, although there may be some specific behaviors that could be improved.    Nutrition Goals Re-Evaluation:  Nutrition Goals Re-Evaluation     Row Name 02/07/23 0847 03/07/23 0958 04/04/23 0928         Goals   Current Weight 176 lb 5.9 oz (80 kg) 171 lb 15.3 oz (78 kg) 171 lb 15.3 oz (78 kg)     Comment triglycerides 219, VitaminD 24.7,  Cr. 1.44, GFR 39 triglycerides 216, HDL 38, GFR 50, Cr 1.17 no new labs; most recent labs triglycerides 216, HDL 38, GFR 50, Cr 1.17     Expected Outcome Physllis reports that she has started making many dietary changes including reduced sodium and reading food labels. Her husband is very supportive as well. Patient will benefit from participation in intensive cardiac rehab for nutrition, exercise, and lifestyle modification. Goals in action. Constance continues to attend the [employer] education and  nutrition series regularly. Evett reports that she has started making many dietary changes including reduced sodium, increased dietary fiber, reduced saturated fat, and reading food labels. Her husband is very supportive as well. She is down 4.4# since starting with our program. Patient will benefit from participation in intensive cardiac rehab for nutrition, exercise, and lifestyle modification. Goals in action. Rhodie continues to attend the [employer] education and nutrition series regularly. Arieal reports that she has started making many dietary changes including reduced sodium, increased dietary fiber, reduced saturated fat, and reading food labels. Her husband is very supportive as well. She is down 4.4# since starting with our program. Triglycerides remain elevated; she denies alcohol intake, denies refined carbohydrates/simple sugar,etc; lipids managed by PCP. Patient will benefit from participation in intensive cardiac rehab for nutrition, exercise, and lifestyle modification.              Nutrition Goals Re-Evaluation:  Nutrition Goals Re-Evaluation     Row Name 02/07/23 0847 03/07/23 0958 04/04/23 0928         Goals   Current Weight 176 lb 5.9 oz (80 kg) 171 lb 15.3 oz (78 kg) 171 lb 15.3 oz (78 kg)     Comment triglycerides 219, VitaminD 24.7,  Cr. 1.44, GFR 39 triglycerides 216, HDL 38, GFR 50, Cr 1.17 no new labs; most recent labs triglycerides 216, HDL 38, GFR 50, Cr 1.17     Expected Outcome Physllis reports that she has started making many dietary changes including reduced sodium and reading food labels. Her husband is very supportive as well. Patient will benefit from participation in intensive cardiac rehab for nutrition, exercise, and lifestyle modification. Goals in action. Kenyatte continues to attend the [employer] education and nutrition series regularly. Mirabel reports that she has started making many dietary changes including reduced sodium, increased  dietary fiber, reduced  saturated fat, and reading food labels. Her husband is very supportive as well. She is down 4.4# since starting with our program. Patient will benefit from participation in intensive cardiac rehab for nutrition, exercise, and lifestyle modification. Goals in action. Harjit continues to attend the [employer] education and nutrition series regularly. Mechell reports that she has started making many dietary changes including reduced sodium, increased dietary fiber, reduced saturated fat, and reading food labels. Her husband is very supportive as well. She is down 4.4# since starting with our program. Triglycerides remain elevated; she denies alcohol intake, denies refined carbohydrates/simple sugar,etc; lipids managed by PCP. Patient will benefit from participation in intensive cardiac rehab for nutrition, exercise, and lifestyle modification.              Nutrition Goals Discharge (Final Nutrition Goals Re-Evaluation):  Nutrition Goals Re-Evaluation - 04/04/23 4098       Goals   Current Weight 171 lb 15.3 oz (78 kg)    Comment no new labs; most recent labs triglycerides 216, HDL 38, GFR 50, Cr 1.17    Expected Outcome Goals in action. Evonne continues to attend the [employer] education and nutrition series regularly. Roniyah reports that she has started making many dietary changes including reduced sodium, increased dietary fiber, reduced saturated fat, and reading food labels. Her husband is very supportive as well. She is down 4.4# since starting with our program. Triglycerides remain elevated; she denies alcohol intake, denies refined carbohydrates/simple sugar,etc; lipids managed by PCP. Patient will benefit from participation in intensive cardiac rehab for nutrition, exercise, and lifestyle modification.             Psychosocial: Target Goals: Acknowledge presence or absence of significant depression and/or stress, maximize coping skills, provide positive support system. Participant is able to  verbalize types and ability to use techniques and skills needed for reducing stress and depression.  Initial Review & Psychosocial Screening:  Initial Psych Review & Screening - 02/01/23 1042       Initial Review   Current issues with None Identified      Family Dynamics   Good Support System? Yes    Comments Has spouse, 3 children, and several grandchildren for support      Barriers   Psychosocial barriers to participate in program There are no identifiable barriers or psychosocial needs.      Screening Interventions   Interventions Encouraged to exercise             Quality of Life Scores:  Quality of Life - 02/01/23 1336       Quality of Life   Select Quality of Life      Quality of Life Scores   Health/Function Pre 29.1 %    Socioeconomic Pre 30 %    Psych/Spiritual Pre 30 %    Family Pre 30 %    GLOBAL Pre 29.6 %            Scores of 19 and below usually indicate a poorer quality of life in these areas.  A difference of  2-3 points is a clinically meaningful difference.  A difference of 2-3 points in the total score of the Quality of Life Index has been associated with significant improvement in overall quality of life, self-image, physical symptoms, and general health in studies assessing change in quality of life.  PHQ-9: Review Flowsheet       02/01/2023  Depression screen PHQ 2/9  Decreased Interest 0  Down, Depressed, Hopeless  0  PHQ - 2 Score 0  Altered sleeping 1  Tired, decreased energy 1  Change in appetite 0  Feeling bad or failure about yourself  0  Trouble concentrating 0  Moving slowly or fidgety/restless 0  Suicidal thoughts 0  PHQ-9 Score 2  Difficult doing work/chores Not difficult at all   Interpretation of Total Score  Total Score Depression Severity:  1-4 = Minimal depression, 5-9 = Mild depression, 10-14 = Moderate depression, 15-19 = Moderately severe depression, 20-27 = Severe depression   Psychosocial Evaluation and  Intervention:   Psychosocial Re-Evaluation:  Psychosocial Re-Evaluation     Row Name 02/08/23 0910 03/08/23 1125 04/05/23 1720         Psychosocial Re-Evaluation   Current issues with None Identified None Identified None Identified     Interventions Encouraged to attend Cardiac Rehabilitation for the exercise Encouraged to attend Cardiac Rehabilitation for the exercise Encouraged to attend Cardiac Rehabilitation for the exercise     Continue Psychosocial Services  No Follow up required No Follow up required No Follow up required              Psychosocial Discharge (Final Psychosocial Re-Evaluation):  Psychosocial Re-Evaluation - 04/05/23 1720       Psychosocial Re-Evaluation   Current issues with None Identified    Interventions Encouraged to attend Cardiac Rehabilitation for the exercise    Continue Psychosocial Services  No Follow up required             Vocational Rehabilitation: Provide vocational rehab assistance to qualifying candidates.   Vocational Rehab Evaluation & Intervention:  Vocational Rehab - 02/01/23 1041       Initial Vocational Rehab Evaluation & Intervention   Assessment shows need for [facility]   Pt is retired            Education: Education Goals: Education classes will be provided on a weekly basis, covering required topics. Participant will state understanding/return demonstration of topics presented.    Education     Row Name 02/07/23 1100     Education   Cardiac Education Topics Pritikin   Select Workshops     Workshops   Educator Exercise Physiologist   Select Psychosocial   Psychosocial Workshop Healthy Sleep for a Healthy Heart   Instruction Review Code 1- Verbalizes Understanding   Class Start Time 0815   Class Stop Time 0856   Class Time Calculation (min) 41 min    Row Name 02/09/23 1000     Education   Cardiac Education Topics Pritikin   Optometrist   Weekly Topic [employer]- Spotlight on the [facility] Zones   Instruction Review Code 1- Verbalizes Understanding   Class Start Time [phone]   Class Stop Time [phone]   Class Time Calculation (min) 32 min    Row Name 02/11/23 0900     Education   Cardiac Education Topics Pritikin   Select Core Videos     Core Videos   Educator Exercise Physiologist   Select Exercise Education   Exercise Education Improving Performance   Instruction Review Code 1- Verbalizes Understanding   Class Start Time [phone]   Class Stop Time 0855   Class Time Calculation (min) 43 min    Row Name 02/14/23 0900     Education   Cardiac Education Topics Pritikin   Glass blower/designer  Nutrition   Nutrition Workshop Fueling a Forensic psychologist   Instruction Review Code 1- Verbalizes Understanding   Class Start Time 0815   Class Stop Time 0858   Class Time Calculation (min) 43 min    Row Name 02/16/23 0900     Education   Cardiac Education Topics Pritikin   Secondary school teacher School   Educator Dietitian   Weekly Topic Simple Sides and Sauces   Instruction Review Code 1- Verbalizes Understanding   Class Start Time 0815   Class Stop Time 0850   Class Time Calculation (min) 35 min    Row Name 02/18/23 0900     Education   Cardiac Education Topics Pritikin   Select Core Videos     Core Videos   Educator Exercise Physiologist   Select Psychosocial   Psychosocial How Our Thoughts Can Heal Our Hearts   Instruction Review Code 1- Verbalizes Understanding   Class Start Time 0815   Class Stop Time 0855   Class Time Calculation (min) 40 min    Row Name 02/21/23 1000     Education   Cardiac Education Topics Pritikin   Geographical information systems officer Psychosocial   Psychosocial Workshop From Head to Heart: The Power of a Healthy Outlook   Instruction Review Code 1- Verbalizes Understanding    Class Start Time 0815   Class Stop Time 0903   Class Time Calculation (min) 48 min    Row Name 02/23/23 0900     Education   Cardiac Education Topics Pritikin   Secondary school teacher School   Educator Dietitian   Weekly Topic Powerhouse Plant-Based Proteins   Instruction Review Code 1- Verbalizes Understanding   Class Start Time [phone]   Class Stop Time 0845   Class Time Calculation (min) 35 min    Row Name 02/25/23 0800     Education   Cardiac Education Topics Pritikin   Select Core Videos     Core Videos   Educator Exercise Physiologist   Select General Education   General Education Hypertension and Heart Disease   Instruction Review Code 1- Verbalizes Understanding   Class Start Time [phone]   Class Stop Time 0843   Class Time Calculation (min) 31 min    Row Name 03/04/23 1400     Education   Cardiac Education Topics Pritikin   Psychologist, forensic General Education   General Education Heart Disease Risk Reduction   Instruction Review Code 1- Verbalizes Understanding   Class Start Time 0810   Class Stop Time 0849   Class Time Calculation (min) 39 min    Row Name 03/07/23 0900     Education   Cardiac Education Topics Pritikin   Select Workshops     Workshops   Educator Exercise Physiologist   Select Exercise   Exercise Workshop Location manager and Fall Prevention   Instruction Review Code 1- Verbalizes Understanding   Class Start Time [phone]   Class Stop Time 0855   Class Time Calculation (min) 36 min    Row Name 03/09/23 0900     Cooking School   Educator Dietitian   Weekly Topic Fast and Healthy Breakfasts   Instruction Review Code 1- Verbalizes Understanding   Class Start Time 0815   Class Stop Time 0848   Class  Time Calculation (min) 33 min    Row Name 03/11/23 0900     Education   Cardiac Education Topics Pritikin   Select Core Videos     Core Videos   Educator  Dietitian   Select Nutrition   Nutrition Overview of the Pritikin Eating Plan   Instruction Review Code 1- Verbalizes Understanding   Class Start Time 0813   Class Stop Time 0852   Class Time Calculation (min) 39 min    Row Name 03/14/23 1100     Education   Cardiac Education Topics Pritikin   Select Core Videos     Workshops   Educator Exercise Physiologist   Select Psychosocial   Psychosocial Workshop Recognizing and Reducing Stress   Instruction Review Code 1- Verbalizes Understanding   Class Start Time [phone]   Class Stop Time 0847   Class Time Calculation (min) 35 min    Row Name 03/16/23 1000     Education   Cardiac Education Topics Pritikin   Secondary school teacher School   Educator Dietitian   Weekly Topic Personalizing Your Pritikin Plate   Instruction Review Code 1- Verbalizes Understanding   Class Start Time 0815   Class Stop Time [phone]   Class Time Calculation (min) 37 min    Row Name 03/18/23 0900     Education   Cardiac Education Topics Pritikin   Select Core Videos     Core Videos   Educator Exercise Physiologist   Select Psychosocial   Psychosocial Healthy Minds, Bodies, Hearts   Instruction Review Code 1- Verbalizes Understanding   Class Start Time 0815   Class Stop Time 0850   Class Time Calculation (min) 35 min    Row Name 03/21/23 0900     Education   Cardiac Education Topics Pritikin   Select Workshops     Workshops   Educator Dietitian   Nutrition Workshop Label Reading   Instruction Review Code 1- Verbalizes Understanding   Class Start Time 0815   Class Stop Time 0858   Class Time Calculation (min) 43 min    Row Name 03/23/23 1400     Education   Cardiac Education Topics Pritikin   Secondary school teacher School   Educator Dietitian   Weekly Topic [employer] Desserts   Instruction Review Code 1- Verbalizes Understanding   Class Start Time [phone]   Class Stop Time 0845   Class Time Calculation (min) 35  min    Row Name 03/25/23 0900     Education   Cardiac Education Topics Pritikin   Select Core Videos     Core Videos   Educator Dietitian   Select Nutrition   Nutrition Other  label reading   Instruction Review Code 1- Verbalizes Understanding   Class Start Time 0810   Class Stop Time 0852   Class Time Calculation (min) 42 min    Row Name 03/28/23 0900     Education   Cardiac Education Topics Pritikin   Select Workshops     Workshops   Educator Exercise Physiologist   Select Exercise   Exercise Workshop Exercise Basics: Building Your Action Plan   Instruction Review Code 1- Verbalizes Understanding   Class Start Time 0808   Class Stop Time 0854   Class Time Calculation (min) 46 min    Row Name 03/30/23 0900     Education   Cardiac Education Topics Pritikin   [employer]  Stage manager and Snacks   Instruction Review Code 1- Verbalizes Understanding   Class Start Time [phone]   Class Stop Time 0845   Class Time Calculation (min) 31 min    Row Name 04/01/23 0800     Education   Cardiac Education Topics Pritikin   Select Core Videos     Core Videos   Educator Exercise Physiologist   Select Nutrition   Nutrition Nutrition Action Plan   Instruction Review Code 1- Verbalizes Understanding   Class Start Time 0813   Class Stop Time 0843   Class Time Calculation (min) 30 min    Row Name 04/04/23 0900     Education   Cardiac Education Topics Pritikin   Select Core Videos     Core Videos   Educator Dietitian   Select Nutrition   Nutrition Calorie Density   Instruction Review Code 1- Verbalizes Understanding   Class Start Time 0815   Class Stop Time 0857   Class Time Calculation (min) 42 min            Core Videos: Exercise    Move It!  Clinical staff conducted group or individual video education with verbal and written material and guidebook.  Patient learns the recommended  Pritikin exercise program. Exercise with the goal of living a long, healthy life. Some of the health benefits of exercise include controlled diabetes, healthier blood pressure levels, improved cholesterol levels, improved heart and lung capacity, improved sleep, and better body composition. Everyone should speak with their doctor before starting or changing an exercise routine.  Biomechanical Limitations Clinical staff conducted group or individual video education with verbal and written material and guidebook.  Patient learns how biomechanical limitations can impact exercise and how we can mitigate and possibly overcome limitations to have an impactful and balanced exercise routine.  Body Composition Clinical staff conducted group or individual video education with verbal and written material and guidebook.  Patient learns that body composition (ratio of muscle mass to fat mass) is a key component to assessing overall fitness, rather than body weight alone. Increased fat mass, especially visceral belly fat, can put [place] at increased risk for metabolic syndrome, type 2 diabetes, heart disease, and even death. It is recommended to combine diet and exercise (cardiovascular and resistance training) to improve your body composition. Seek guidance from your physician and exercise physiologist before implementing an exercise routine.  Exercise Action Plan Clinical staff conducted group or individual video education with verbal and written material and guidebook.  Patient learns the recommended strategies to achieve and enjoy long-term exercise adherence, including variety, self-motivation, self-efficacy, and positive decision making. Benefits of exercise include fitness, good health, weight management, more energy, better sleep, less stress, and overall well-being.  Medical   Heart Disease Risk Reduction Clinical staff conducted group or individual video education with verbal and written material and  guidebook.  Patient learns our heart is our most vital organ as it circulates oxygen, nutrients, white blood cells, and hormones throughout the entire body, and carries waste away. Data supports a plant-based eating plan like the Pritikin Program for its effectiveness in slowing progression of and reversing heart disease. The video provides a number of recommendations to address heart disease.   Metabolic Syndrome and Belly Fat  Clinical staff conducted group or individual video education with verbal and written material and guidebook.  Patient learns what metabolic syndrome is, how it leads to heart disease, and  how one can reverse it and keep it from coming back. You have metabolic syndrome if you have 3 of the following 5 criteria: abdominal obesity, high blood pressure, high triglycerides, low HDL cholesterol, and high blood sugar.  Hypertension and Heart Disease Clinical staff conducted group or individual video education with verbal and written material and guidebook.  Patient learns that high blood pressure, or hypertension, is very common in the [place]. Hypertension is largely due to excessive salt intake, but other important risk factors include being overweight, physical inactivity, drinking too much alcohol, smoking, and not eating enough potassium from fruits and vegetables. High blood pressure is a leading risk factor for heart attack, stroke, congestive heart failure, dementia, kidney failure, and premature death. Long-term effects of excessive salt intake include stiffening of the arteries and thickening of heart muscle and organ damage. Recommendations include ways to reduce hypertension and the risk of heart disease.  Diseases of Our Time - Focusing on Diabetes Clinical staff conducted group or individual video education with verbal and written material and guidebook.  Patient learns why the best way to stop diseases of our time is prevention, through food and other lifestyle  changes. Medicine (such as prescription pills and surgeries) is often only a Band-Aid on the problem, not a long-term solution. Most common diseases of our time include obesity, type 2 diabetes, hypertension, heart disease, and cancer. The Pritikin Program is recommended and has been proven to help reduce, reverse, and/or prevent the damaging effects of metabolic syndrome.  Nutrition   Overview of the Pritikin Eating Plan  Clinical staff conducted group or individual video education with verbal and written material and guidebook.  Patient learns about the Pritikin Eating Plan for disease risk reduction. The Pritikin Eating Plan emphasizes a wide variety of unrefined, minimally-processed carbohydrates, like fruits, vegetables, whole grains, and legumes. Go, Caution, and Stop food choices are explained. Plant-based and lean animal proteins are emphasized. Rationale provided for low sodium intake for blood pressure control, low added sugars for blood sugar stabilization, and low added fats and oils for coronary artery disease risk reduction and weight management.  Calorie Density  Clinical staff conducted group or individual video education with verbal and written material and guidebook.  Patient learns about calorie density and how it impacts the Pritikin Eating Plan. Knowing the characteristics of the food you choose will help you decide whether those foods will lead to weight gain or weight loss, and whether you want to consume more or less of them. Weight loss is usually a side effect of the Pritikin Eating Plan because of its focus on low calorie-dense foods.  Label Reading  Clinical staff conducted group or individual video education with verbal and written material and guidebook.  Patient learns about the Pritikin recommended label reading guidelines and corresponding recommendations regarding calorie density, added sugars, sodium content, and whole grains.  Dining Out - Part 1  Clinical staff  conducted group or individual video education with verbal and written material and guidebook.  Patient learns that restaurant meals can be sabotaging because they can be so high in calories, fat, sodium, and/or sugar. Patient learns recommended strategies on how to positively address this and avoid unhealthy pitfalls.  Facts on Fats  Clinical staff conducted group or individual video education with verbal and written material and guidebook.  Patient learns that lifestyle modifications can be just as effective, if not more so, as many medications for lowering your risk of heart disease. A Pritikin lifestyle can  help to reduce your risk of inflammation and atherosclerosis (cholesterol build-up, or plaque, in the artery walls). Lifestyle interventions such as dietary choices and physical activity address the cause of atherosclerosis. A review of the types of fats and their impact on blood cholesterol levels, along with dietary recommendations to reduce fat intake is also included.  Nutrition Action Plan  Clinical staff conducted group or individual video education with verbal and written material and guidebook.  Patient learns how to incorporate Pritikin recommendations into their lifestyle. Recommendations include planning and keeping personal health goals in mind as an important part of their success.  Healthy Mind-Set    Healthy Minds, Bodies, Hearts  Clinical staff conducted group or individual video education with verbal and written material and guidebook.  Patient learns how to identify when they are stressed. Video will discuss the impact of that stress, as well as the many benefits of stress management. Patient will also be introduced to stress management techniques. The way we think, act, and feel has an impact on our hearts.  How Our Thoughts Can Heal Our Hearts  Clinical staff conducted group or individual video education with verbal and written material and guidebook.  Patient learns that  negative thoughts can cause depression and anxiety. This can result in negative lifestyle behavior and serious health problems. Cognitive behavioral therapy is an effective method to help control our thoughts in order to change and improve our emotional outlook.  Additional Videos:  Exercise    Improving Performance  Clinical staff conducted group or individual video education with verbal and written material and guidebook.  Patient learns to use a non-linear approach by alternating intensity levels and lengths of time spent exercising to help burn more calories and lose more body fat. Cardiovascular exercise helps improve heart health, metabolism, hormonal balance, blood sugar control, and recovery from fatigue. Resistance training improves strength, endurance, balance, coordination, reaction time, metabolism, and muscle mass. Flexibility exercise improves circulation, posture, and balance. Seek guidance from your physician and exercise physiologist before implementing an exercise routine and learn your capabilities and proper form for all exercise.  Introduction to Yoga  Clinical staff conducted group or individual video education with verbal and written material and guidebook.  Patient learns about yoga, a discipline of the coming together of mind, breath, and body. The benefits of yoga include improved flexibility, improved range of motion, better posture and core strength, increased lung function, weight loss, and positive self-image. Yoga's heart health benefits include lowered blood pressure, healthier heart rate, decreased cholesterol and triglyceride levels, improved immune function, and reduced stress. Seek guidance from your physician and exercise physiologist before implementing an exercise routine and learn your capabilities and proper form for all exercise.  Medical   Aging: Enhancing Your Quality of Life  Clinical staff conducted group or individual video education with verbal and  written material and guidebook.  Patient learns key strategies and recommendations to stay in good physical health and enhance quality of life, such as prevention strategies, having an advocate, securing a Health Care Proxy and Power of Attorney, and keeping a list of medications and system for tracking them. It also discusses how to avoid risk for bone loss.  Biology of Weight Control  Clinical staff conducted group or individual video education with verbal and written material and guidebook.  Patient learns that weight gain occurs because we consume more calories than we burn (eating more, moving less). Even if your body weight is normal, you may have higher ratios of  fat compared to muscle mass. Too much body fat puts you at increased risk for cardiovascular disease, heart attack, stroke, type 2 diabetes, and obesity-related cancers. In addition to exercise, following the Pritikin Eating Plan can help reduce your risk.  Decoding Lab Results  Clinical staff conducted group or individual video education with verbal and written material and guidebook.  Patient learns that lab test reflects one measurement whose values change over time and are influenced by many factors, including medication, stress, sleep, exercise, food, hydration, pre-existing medical conditions, and more. It is recommended to use the knowledge from this video to become more involved with your lab results and evaluate your numbers to speak with your doctor.   Diseases of Our Time - Overview  Clinical staff conducted group or individual video education with verbal and written material and guidebook.  Patient learns that according to the CDC, 50% to 70% of chronic diseases (such as obesity, type 2 diabetes, elevated lipids, hypertension, and heart disease) are avoidable through lifestyle improvements including healthier food choices, listening to satiety cues, and increased physical activity.  Sleep Disorders Clinical staff  conducted group or individual video education with verbal and written material and guidebook.  Patient learns how good quality and duration of sleep are important to overall health and well-being. Patient also learns about sleep disorders and how they impact health along with recommendations to address them, including discussing with a physician.  Nutrition  Dining Out - Part 2 Clinical staff conducted group or individual video education with verbal and written material and guidebook.  Patient learns how to plan ahead and communicate in order to maximize their dining experience in a healthy and nutritious manner. Included are recommended food choices based on the type of restaurant the patient is visiting.   Fueling a Banker conducted group or individual video education with verbal and written material and guidebook.  There is a strong connection between our food choices and our health. Diseases like obesity and type 2 diabetes are very prevalent and are in large-part due to lifestyle choices. The Pritikin Eating Plan provides plenty of food and hunger-curbing satisfaction. It is easy to follow, affordable, and helps reduce health risks.  Menu Workshop  Clinical staff conducted group or individual video education with verbal and written material and guidebook.  Patient learns that restaurant meals can sabotage health goals because they are often packed with calories, fat, sodium, and sugar. Recommendations include strategies to plan ahead and to communicate with the manager, chef, or server to help order a healthier meal.  Planning Your Eating Strategy  Clinical staff conducted group or individual video education with verbal and written material and guidebook.  Patient learns about the Pritikin Eating Plan and its benefit of reducing the risk of disease. The Pritikin Eating Plan does not focus on calories. Instead, it emphasizes high-quality, nutrient-rich foods. By knowing  the characteristics of the foods, we choose, we can determine their calorie density and make informed decisions.  Targeting Your Nutrition Priorities  Clinical staff conducted group or individual video education with verbal and written material and guidebook.  Patient learns that lifestyle habits have a tremendous impact on disease risk and progression. This video provides eating and physical activity recommendations based on your personal health goals, such as reducing LDL cholesterol, losing weight, preventing or controlling type 2 diabetes, and reducing high blood pressure.  Vitamins and Minerals  Clinical staff conducted group or individual video education with verbal and written material and  guidebook.  Patient learns different ways to obtain key vitamins and minerals, including through a recommended healthy diet. It is important to discuss all supplements you take with your doctor.   Healthy Mind-Set    Smoking Cessation  Clinical staff conducted group or individual video education with verbal and written material and guidebook.  Patient learns that cigarette smoking and tobacco addiction pose a serious health risk which affects millions of people. Stopping smoking will significantly reduce the risk of heart disease, lung disease, and many forms of cancer. Recommended strategies for quitting are covered, including working with your doctor to develop a successful plan.  Culinary   Becoming a Set designer conducted group or individual video education with verbal and written material and guidebook.  Patient learns that cooking at home can be healthy, cost-effective, quick, and puts them in control. Keys to cooking healthy recipes will include looking at your recipe, assessing your equipment needs, planning ahead, making it simple, choosing cost-effective seasonal ingredients, and limiting the use of added fats, salts, and sugars.  Cooking - Breakfast and Snacks  Clinical  staff conducted group or individual video education with verbal and written material and guidebook.  Patient learns how important breakfast is to satiety and nutrition through the entire day. Recommendations include key foods to eat during breakfast to help stabilize blood sugar levels and to prevent overeating at meals later in the day. Planning ahead is also a key component.  Cooking - Educational psychologist conducted group or individual video education with verbal and written material and guidebook.  Patient learns eating strategies to improve overall health, including an approach to cook more at home. Recommendations include thinking of animal protein as a side on your plate rather than center stage and focusing instead on lower calorie dense options like vegetables, fruits, whole grains, and plant-based proteins, such as beans. Making sauces in large quantities to freeze for later and leaving the skin on your vegetables are also recommended to maximize your experience.  Cooking - Healthy Salads and Dressing Clinical staff conducted group or individual video education with verbal and written material and guidebook.  Patient learns that vegetables, fruits, whole grains, and legumes are the foundations of the Pritikin Eating Plan. Recommendations include how to incorporate each of these in flavorful and healthy salads, and how to create homemade salad dressings. Proper handling of ingredients is also covered. Cooking - Soups and [employer] - Soups and Desserts Clinical staff conducted group or individual video education with verbal and written material and guidebook.  Patient learns that Pritikin soups and desserts make for easy, nutritious, and delicious snacks and meal components that are low in sodium, fat, sugar, and calorie density, while high in vitamins, minerals, and filling fiber. Recommendations include simple and healthy ideas for soups and desserts.   Overview     The  Pritikin Solution Program Overview Clinical staff conducted group or individual video education with verbal and written material and guidebook.  Patient learns that the results of the Pritikin Program have been documented in more than 100 articles published in peer-reviewed journals, and the benefits include reducing risk factors for (and, in some cases, even reversing) high cholesterol, high blood pressure, type 2 diabetes, obesity, and more! An overview of the three key pillars of the Pritikin Program will be covered: eating well, doing regular exercise, and having a healthy mind-set.  WORKSHOPS  Exercise: Exercise Basics: Building Your Action Plan Clinical staff led group  instruction and group discussion with PowerPoint presentation and patient guidebook. To enhance the learning environment the use of posters, models and videos may be added. At the conclusion of this workshop, patients will comprehend the difference between physical activity and exercise, as well as the benefits of incorporating both, into their routine. Patients will understand the FITT (Frequency, Intensity, Time, and Type) principle and how to use it to build an exercise action plan. In addition, safety concerns and other considerations for exercise and cardiac rehab will be addressed by the presenter. The purpose of this lesson is to promote a comprehensive and effective weekly exercise routine in order to improve patients' overall level of fitness.   Managing Heart Disease: Your Path to a Healthier Heart Clinical staff led group instruction and group discussion with PowerPoint presentation and patient guidebook. To enhance the learning environment the use of posters, models and videos may be added.At the conclusion of this workshop, patients will understand the anatomy and physiology of the heart. Additionally, they will understand how Pritikin's three pillars impact the risk factors, the progression, and the management  of heart disease.  The purpose of this lesson is to provide a high-level overview of the heart, heart disease, and how the Pritikin lifestyle positively impacts risk factors.  Exercise Biomechanics Clinical staff led group instruction and group discussion with PowerPoint presentation and patient guidebook. To enhance the learning environment the use of posters, models and videos may be added. Patients will learn how the structural parts of their bodies function and how these functions impact their daily activities, movement, and exercise. Patients will learn how to promote a neutral spine, learn how to manage pain, and identify ways to improve their physical movement in order to promote healthy living. The purpose of this lesson is to expose patients to common physical limitations that impact physical activity. Participants will learn practical ways to adapt and manage aches and pains, and to minimize their effect on regular exercise. Patients will learn how to maintain good posture while sitting, walking, and lifting.  Balance Training and Fall Prevention  Clinical staff led group instruction and group discussion with PowerPoint presentation and patient guidebook. To enhance the learning environment the use of posters, models and videos may be added. At the conclusion of this workshop, patients will understand the importance of their sensorimotor skills (vision, proprioception, and the vestibular system) in maintaining their ability to balance as they age. Patients will apply a variety of balancing exercises that are appropriate for their current level of function. Patients will understand the common causes for poor balance, possible solutions to these problems, and ways to modify their physical environment in order to minimize their fall risk. The purpose of this lesson is to teach patients about the importance of maintaining balance as they age and ways to minimize their risk of  falling.  WORKSHOPS   Nutrition:  Fueling a Ship broker led group instruction and group discussion with PowerPoint presentation and patient guidebook. To enhance the learning environment the use of posters, models and videos may be added. Patients will review the foundational principles of the Pritikin Eating Plan and understand what constitutes a serving size in each of the food groups. Patients will also learn Pritikin-friendly foods that are better choices when away from home and review make-ahead meal and snack options. Calorie density will be reviewed and applied to three nutrition priorities: weight maintenance, weight loss, and weight gain. The purpose of this lesson is to reinforce (in a  group setting) the key concepts around what patients are recommended to eat and how to apply these guidelines when away from home by planning and selecting Pritikin-friendly options. Patients will understand how calorie density may be adjusted for different weight management goals.  Mindful Eating  Clinical staff led group instruction and group discussion with PowerPoint presentation and patient guidebook. To enhance the learning environment the use of posters, models and videos may be added. Patients will briefly review the concepts of the Pritikin Eating Plan and the importance of low-calorie dense foods. The concept of mindful eating will be introduced as well as the importance of paying attention to internal hunger signals. Triggers for non-hunger eating and techniques for dealing with triggers will be explored. The purpose of this lesson is to provide patients with the opportunity to review the basic principles of the Pritikin Eating Plan, discuss the value of eating mindfully and how to measure internal cues of hunger and fullness using the Hunger Scale. Patients will also discuss reasons for non-hunger eating and learn strategies to use for controlling emotional eating.  Targeting Your  Nutrition Priorities Clinical staff led group instruction and group discussion with PowerPoint presentation and patient guidebook. To enhance the learning environment the use of posters, models and videos may be added. Patients will learn how to determine their genetic susceptibility to disease by reviewing their family history. Patients will gain insight into the importance of diet as part of an overall healthy lifestyle in mitigating the impact of genetics and other environmental insults. The purpose of this lesson is to provide patients with the opportunity to assess their personal nutrition priorities by looking at their family history, their own health history and current risk factors. Patients will also be able to discuss ways of prioritizing and modifying the Pritikin Eating Plan for their highest risk areas  Menu  Clinical staff led group instruction and group discussion with PowerPoint presentation and patient guidebook. To enhance the learning environment the use of posters, models and videos may be added. Using menus brought in from [employer], or printed from [employer], patients will apply the Pritikin dining out guidelines that were presented in the [employer] video. Patients will also be able to practice these guidelines in a variety of provided scenarios. The purpose of this lesson is to provide patients with the opportunity to practice hands-on learning of the Pritikin Dining Out guidelines with actual menus and practice scenarios.  Label Reading Clinical staff led group instruction and group discussion with PowerPoint presentation and patient guidebook. To enhance the learning environment the use of posters, models and videos may be added. Patients will review and discuss the Pritikin label reading guidelines presented in Pritikin's Label Reading Educational series video. Using fool labels brought in from local grocery stores and markets, patients will apply the  label reading guidelines and determine if the packaged food meet the Pritikin guidelines. The purpose of this lesson is to provide patients with the opportunity to review, discuss, and practice hands-on learning of the Pritikin Label Reading guidelines with actual packaged food labels. Cooking School  Pritikin's [employer] are designed to teach patients ways to prepare quick, simple, and affordable recipes at home. The importance of nutrition's role in chronic disease risk reduction is reflected in its emphasis in the overall Pritikin program. By learning how to prepare essential core Pritikin Eating Plan recipes, patients will increase control over what they eat; be able to customize the flavor of foods without the  use of added salt, sugar, or fat; and improve the quality of the food they consume. By learning a set of core recipes which are easily assembled, quickly prepared, and affordable, patients are more likely to prepare more healthy foods at home. These workshops focus on convenient breakfasts, simple entres, side dishes, and desserts which can be prepared with minimal effort and are consistent with nutrition recommendations for cardiovascular risk reduction. Cooking [employer] are taught by a Armed forces logistics/support/administrative officer (RD) who has been trained by the [employer]. The chef or RD has a clear understanding of the importance of minimizing - if not completely eliminating - added fat, sugar, and sodium in recipes. Throughout the series of Cooking School Workshop sessions, patients will learn about healthy ingredients and efficient methods of cooking to build confidence in their capability to prepare    Cooking School weekly topics:  Adding Flavor- Sodium-Free  Fast and Healthy Breakfasts  Powerhouse Plant-Based Proteins  Satisfying Salads and Dressings  Simple Sides and Sauces  International Cuisine-Spotlight on the [employer] Zones  Delicious Desserts  Savory  Soups  [employer] - Meals in a Astronomer Appetizers and Snacks  Comforting Weekend Breakfasts  One-Pot Wonders   Fast Evening Meals  Landscape architect Your Pritikin Plate  WORKSHOPS   Healthy Mindset (Psychosocial):  Focused Goals, Sustainable Changes Clinical staff led group instruction and group discussion with PowerPoint presentation and patient guidebook. To enhance the learning environment the use of posters, models and videos may be added. Patients will be able to apply effective goal setting strategies to establish at least one personal goal, and then take consistent, meaningful action toward that goal. They will learn to identify common barriers to achieving personal goals and develop strategies to overcome them. Patients will also gain an understanding of how our mind-set can impact our ability to achieve goals and the importance of cultivating a positive and growth-oriented mind-set. The purpose of this lesson is to provide patients with a deeper understanding of how to set and achieve personal goals, as well as the tools and strategies needed to overcome common obstacles which may arise along the way.  From Head to Heart: The Power of a Healthy Outlook  Clinical staff led group instruction and group discussion with PowerPoint presentation and patient guidebook. To enhance the learning environment the use of posters, models and videos may be added. Patients will be able to recognize and describe the impact of emotions and mood on physical health. They will discover the importance of self-care and explore self-care practices which may work for them. Patients will also learn how to utilize the 4 C's to cultivate a healthier outlook and better manage stress and challenges. The purpose of this lesson is to demonstrate to patients how a healthy outlook is an essential part of maintaining good health, especially as they continue their cardiac rehab journey.  Healthy  Sleep for a Healthy Heart Clinical staff led group instruction and group discussion with PowerPoint presentation and patient guidebook. To enhance the learning environment the use of posters, models and videos may be added. At the conclusion of this workshop, patients will be able to demonstrate knowledge of the importance of sleep to overall health, well-being, and quality of life. They will understand the symptoms of, and treatments for, common sleep disorders. Patients will also be able to identify daytime and nighttime behaviors which impact sleep, and they will be able to apply these tools to help manage sleep-related  challenges. The purpose of this lesson is to provide patients with a general overview of sleep and outline the importance of quality sleep. Patients will learn about a few of the most common sleep disorders. Patients will also be introduced to the concept of "sleep hygiene," and discover ways to self-manage certain sleeping problems through simple daily behavior changes. Finally, the workshop will motivate patients by clarifying the links between quality sleep and their goals of heart-healthy living.   Recognizing and Reducing Stress Clinical staff led group instruction and group discussion with PowerPoint presentation and patient guidebook. To enhance the learning environment the use of posters, models and videos may be added. At the conclusion of this workshop, patients will be able to understand the types of stress reactions, differentiate between acute and chronic stress, and recognize the impact that chronic stress has on their health. They will also be able to apply different coping mechanisms, such as reframing negative self-talk. Patients will have the opportunity to practice a variety of stress management techniques, such as deep abdominal breathing, progressive muscle relaxation, and/or guided imagery.  The purpose of this lesson is to educate patients on the role of stress in their  lives and to provide healthy techniques for coping with it.  Learning Barriers/Preferences:  Learning Barriers/Preferences - 02/01/23 1336       Learning Barriers/Preferences   Learning Barriers Sight    Learning Preferences Computer/Internet;Pictoral;Video             Education Topics:  Knowledge Questionnaire Score:  Knowledge Questionnaire Score - 02/01/23 1335       Knowledge Questionnaire Score   Pre Score 21/24             Core Components/Risk Factors/Patient Goals at Admission:  Personal Goals and Risk Factors at Admission - 02/01/23 1335       Core Components/Risk Factors/Patient Goals on Admission   Hypertension Yes    Intervention Provide education on lifestyle modifcations including regular physical activity/exercise, weight management, moderate sodium restriction and increased consumption of fresh fruit, vegetables, and low fat dairy, alcohol moderation, and smoking cessation.;Monitor prescription use compliance.    Expected Outcomes Short Term: Continued assessment and intervention until BP is < 140/36mm HG in hypertensive participants. < 130/25mm HG in hypertensive participants with diabetes, heart failure or chronic kidney disease.;Long Term: Maintenance of blood pressure at goal levels.    Lipids Yes    Intervention Provide education and support for participant on nutrition & aerobic/resistive exercise along with prescribed medications to achieve LDL 70mg , HDL >40mg .    Expected Outcomes Short Term: Participant states understanding of desired cholesterol values and is compliant with medications prescribed. Participant is following exercise prescription and nutrition guidelines.;Long Term: Cholesterol controlled with medications as prescribed, with individualized exercise RX and with personalized nutrition plan. Value goals: LDL < 70mg , HDL > 40 mg.             Core Components/Risk Factors/Patient Goals Review:   Goals and Risk Factor Review     Row  Name 02/08/23 0910 03/08/23 1126 04/05/23 1720         Core Components/Risk Factors/Patient Goals Review   Personal Goals Review Weight Management/Obesity;Hypertension;Lipids Weight Management/Obesity;Hypertension;Lipids Weight Management/Obesity;Hypertension;Lipids     Review Kezaria started intensive cardiac rehab on 02/08/23 and did well with exercise. Vital signs were stable Bellarae continues to do well with exercise. She stated she feels she is improving. Vital signs were stable Dashonda continues to do well with exercise. She stated she feels she  is improving. Vital signs remain stable. Liza has lost 2.0 kg since starting the program     Expected Outcomes Novena will continue to participate in intensive cardiac rehab for exercise, nutrition and lifestyle modifications Alandra will continue to participate in intensive cardiac rehab for exercise, nutrition and lifestyle modifications Jaley will continue to participate in intensive cardiac rehab for exercise, nutrition and lifestyle modifications              Core Components/Risk Factors/Patient Goals at Discharge (Final Review):   Goals and Risk Factor Review - 04/05/23 1720       Core Components/Risk Factors/Patient Goals Review   Personal Goals Review Weight Management/Obesity;Hypertension;Lipids    Review Bertice continues to do well with exercise. She stated she feels she is improving. Vital signs remain stable. Yailine has lost 2.0 kg since starting the program    Expected Outcomes Jailia will continue to participate in intensive cardiac rehab for exercise, nutrition and lifestyle modifications             ITP Comments:  ITP Comments     Row Name 02/01/23 1035 02/08/23 0909 03/08/23 1124 04/05/23 1719     ITP Comments Armanda Magic, MD:  Medical Director.  Introduction to the [employer] / Intensive Cardiac Rehab.  Initial orientation packet reviewed with the patient. 30 Day ITP Review. Atenas started  intensive cardiac rehab on 02/08/23 and did well with exercise 30 Day ITP Review. Carla has good participation and attendance in cardiac rehab. 30 Day ITP Review. Ailed continues to have  good participation and attendance in cardiac rehab. Razan will complete intensive cardiac rehab on 04/15/23             Comments: See ITP comments:

## 2023-04-06 ENCOUNTER — Encounter (HOSPITAL_COMMUNITY)
Admission: RE | Admit: 2023-04-06 | Discharge: 2023-04-06 | Disposition: A | Discharge status: Home / Self Care | Payer: Medicare Other | Source: Ambulatory Visit | Attending: Cardiology | Admitting: Cardiology

## 2023-04-06 DIAGNOSIS — Z955 Presence of coronary angioplasty implant and graft: Secondary | ICD-10-CM

## 2023-04-08 ENCOUNTER — Encounter (HOSPITAL_COMMUNITY)
Admission: RE | Admit: 2023-04-08 | Discharge: 2023-04-08 | Disposition: A | Discharge status: Home / Self Care | Payer: Medicare Other | Source: Ambulatory Visit | Attending: Cardiology | Admitting: Cardiology

## 2023-04-08 DIAGNOSIS — Z955 Presence of coronary angioplasty implant and graft: Secondary | ICD-10-CM | POA: Diagnosis not present

## 2023-04-11 ENCOUNTER — Encounter (HOSPITAL_COMMUNITY)
Admission: RE | Admit: 2023-04-11 | Discharge: 2023-04-11 | Disposition: A | Discharge status: Home / Self Care | Payer: Medicare Other | Source: Ambulatory Visit | Attending: Cardiology | Admitting: Cardiology

## 2023-04-11 DIAGNOSIS — Z955 Presence of coronary angioplasty implant and graft: Secondary | ICD-10-CM | POA: Diagnosis not present

## 2023-04-13 ENCOUNTER — Encounter (HOSPITAL_COMMUNITY)
Admission: RE | Admit: 2023-04-13 | Discharge: 2023-04-13 | Disposition: A | Discharge status: Home / Self Care | Payer: Medicare Other | Source: Ambulatory Visit | Attending: Cardiology | Admitting: Cardiology

## 2023-04-13 DIAGNOSIS — Z955 Presence of coronary angioplasty implant and graft: Secondary | ICD-10-CM | POA: Diagnosis not present

## 2023-04-15 ENCOUNTER — Encounter (HOSPITAL_COMMUNITY)
Admission: RE | Admit: 2023-04-15 | Discharge: 2023-04-15 | Disposition: A | Discharge status: Home / Self Care | Payer: Medicare Other | Source: Ambulatory Visit | Attending: Cardiology | Admitting: Cardiology

## 2023-04-15 VITALS — Ht 64.5 in | Wt 169.5 lb

## 2023-04-15 DIAGNOSIS — Z955 Presence of coronary angioplasty implant and graft: Secondary | ICD-10-CM | POA: Diagnosis not present

## 2023-04-18 ENCOUNTER — Encounter (HOSPITAL_COMMUNITY)
Admission: RE | Admit: 2023-04-18 | Discharge: 2023-04-18 | Disposition: A | Discharge status: Home / Self Care | Payer: Medicare Other | Source: Ambulatory Visit | Attending: Cardiology | Admitting: Cardiology

## 2023-04-18 DIAGNOSIS — Z955 Presence of coronary angioplasty implant and graft: Secondary | ICD-10-CM

## 2023-04-18 NOTE — Progress Notes (Signed)
Discharge Progress Report  Patient Details  Name: Amy Kerr MRN: [id number] Date of Birth: 12-18-1950 Referring Provider:   Flowsheet Row INTENSIVE CARDIAC REHAB ORIENT from 02/01/2023 in [facility]  Referring Provider Tessa Lerner, MD        Number of Visits: 20  Reason for Discharge:  Patient reached a stable level of exercise. Patient independent in their exercise. Patient has met program and personal goals.  Smoking History:  Social History   Tobacco Use  Smoking Status Never  Smokeless Tobacco Never    Diagnosis:  12/21/22 Status post coronary artery stent placement OM1  ADL UCSD:   Initial Exercise Prescription:  Initial Exercise Prescription - 02/01/23 1300       Date of Initial Exercise RX and Referring Provider   Date 02/01/23    Referring Provider Tessa Lerner, MD    Expected Discharge Date 04/15/23      NuStep   Level 2    SPM 75    Minutes 15    METs 2.4      Track   Laps 8    Minutes 15    METs 2.4      Prescription Details   Frequency (times per week) 3    Duration Progress to 30 minutes of continuous aerobic without signs/symptoms of physical distress      Intensity   THRR 40-80% of Max Heartrate 59-118    Ratings of Perceived Exertion 11-13    Perceived Dyspnea 0-4      Progression   Progression Continue progressive overload as per policy without signs/symptoms or physical distress.      Resistance Training   Training Prescription Yes    Weight 2 lbs    Reps 10-15             Discharge Exercise Prescription (Final Exercise Prescription Changes):  Exercise Prescription Changes - 04/18/23 1030       Response to Exercise   Blood Pressure (Admit) 132/62    Blood Pressure (Exercise) 158/66    Blood Pressure (Exit) 122/72    Heart Rate (Admit) 63 bpm    Heart Rate (Exercise) 102 bpm    Heart Rate (Exit) 72 bpm    Rating of Perceived Exertion (Exercise) 9     Symptoms None    Comments Pt graduated from the CRP2 program today    Duration Continue with 30 min of aerobic exercise without signs/symptoms of physical distress.    Intensity THRR unchanged      Progression   Progression Continue to progress workloads to maintain intensity without signs/symptoms of physical distress.    Average METs 3.5      Resistance Training   Training Prescription Yes    Weight 3 lbs    Reps 10-15    Time 10 Minutes      Interval Training   Interval Training No      NuStep   Level 5    SPM 80    Minutes 15    METs 3.2      Track   Laps 21    Minutes 15    METs 3.68      Home Exercise Plan   Plans to continue exercise at Home (comment)    Frequency Add 4 additional days to program exercise sessions.    Initial Home Exercises Provided 03/11/23             Functional Capacity:  6  Minute Walk     Row Name 02/01/23 1123 04/13/23 0904       6 Minute Walk   Phase Initial Discharge    Distance 1164 feet 1720 feet    Distance % Change -- 47.77 %    Distance Feet Change -- 556 ft    Walk Time 6 minutes 6 minutes    # of Rest Breaks 0 0    MPH 2.2 3.3    METS 2.42 3.3    RPE 8 9    Perceived Dyspnea  0 0    VO2 Peak 8.5 11.43    Symptoms No No    Resting HR 70 bpm 66 bpm    Resting BP 118/78 114/66    Resting Oxygen Saturation  98 % --    Exercise Oxygen Saturation  during 6 min walk 98 % --    Max Ex. HR 94 bpm 70 bpm    Max Ex. BP 144/76 160/80    2 Minute Post BP 124/70 --             6 Minute Walk     Row Name 02/01/23 1123 04/13/23 0904       6 Minute Walk   Phase Initial Discharge    Distance 1164 feet 1720 feet    Distance % Change -- 47.77 %    Distance Feet Change -- 556 ft    Walk Time 6 minutes 6 minutes    # of Rest Breaks 0 0    MPH 2.2 3.3    METS 2.42 3.3    RPE 8 9    Perceived Dyspnea  0 0    VO2 Peak 8.5 11.43    Symptoms No No    Resting HR 70 bpm 66 bpm    Resting BP 118/78 114/66    Resting  Oxygen Saturation  98 % --    Exercise Oxygen Saturation  during 6 min walk 98 % --    Max Ex. HR 94 bpm 70 bpm    Max Ex. BP 144/76 160/80    2 Minute Post BP 124/70 --             Psychological, QOL, Others - Outcomes: PHQ 2/9:    04/18/2023   10:16 AM 02/01/2023    1:34 PM  Depression screen PHQ 2/9  Decreased Interest 0 0  Down, Depressed, Hopeless 0 0  PHQ - 2 Score 0 0  Altered sleeping 1 1  Tired, decreased energy 0 1  Change in appetite 0 0  Feeling bad or failure about yourself  0 0  Trouble concentrating 0 0  Moving slowly or fidgety/restless 0 0  Suicidal thoughts 0 0  PHQ-9 Score 1 2  Difficult doing work/chores Not difficult at all Not difficult at all    Quality of Life:  Quality of Life - 04/15/23 0841       Quality of Life Scores   Health/Function Pre 29.1 %    Health/Function Post 29.2 %    Health/Function % Change 0.34 %    Socioeconomic Pre 30 %    Socioeconomic Post 30 %    Socioeconomic % Change  0 %    Psych/Spiritual Pre 30 %    Psych/Spiritual Post 30 %    Psych/Spiritual % Change 0 %    Family Pre 30 %    Family Post 28.8 %    Family % Change -4 %    GLOBAL Pre 29.6 %  GLOBAL Post 29.47 %    GLOBAL % Change -0.44 %             Personal Goals: Goals established at orientation with interventions provided to work toward goal.  Personal Goals and Risk Factors at Admission - 02/01/23 1335       Core Components/Risk Factors/Patient Goals on Admission   Hypertension Yes    Intervention Provide education on lifestyle modifcations including regular physical activity/exercise, weight management, moderate sodium restriction and increased consumption of fresh fruit, vegetables, and low fat dairy, alcohol moderation, and smoking cessation.;Monitor prescription use compliance.    Expected Outcomes Short Term: Continued assessment and intervention until BP is < 140/67mm HG in hypertensive participants. < 130/63mm HG in hypertensive  participants with diabetes, heart failure or chronic kidney disease.;Long Term: Maintenance of blood pressure at goal levels.    Lipids Yes    Intervention Provide education and support for participant on nutrition & aerobic/resistive exercise along with prescribed medications to achieve LDL 70mg , HDL >40mg .    Expected Outcomes Short Term: Participant states understanding of desired cholesterol values and is compliant with medications prescribed. Participant is following exercise prescription and nutrition guidelines.;Long Term: Cholesterol controlled with medications as prescribed, with individualized exercise RX and with personalized nutrition plan. Value goals: LDL < 70mg , HDL > 40 mg.              Personal Goals Discharge:  Goals and Risk Factor Review     Row Name 02/08/23 0910 03/08/23 1126 04/05/23 1720         Core Components/Risk Factors/Patient Goals Review   Personal Goals Review Weight Management/Obesity;Hypertension;Lipids Weight Management/Obesity;Hypertension;Lipids Weight Management/Obesity;Hypertension;Lipids     Review Famie started intensive cardiac rehab on 02/08/23 and did well with exercise. Vital signs were stable Yohana continues to do well with exercise. She stated she feels she is improving. Vital signs were stable Jaszmine continues to do well with exercise. She stated she feels she is improving. Vital signs remain stable. Narcedalia has lost 2.0 kg since starting the program     Expected Outcomes Asjha will continue to participate in intensive cardiac rehab for exercise, nutrition and lifestyle modifications Serenia will continue to participate in intensive cardiac rehab for exercise, nutrition and lifestyle modifications Latazia will continue to participate in intensive cardiac rehab for exercise, nutrition and lifestyle modifications              Goals and Risk Factor Review     Row Name 02/08/23 0910 03/08/23 1126 04/05/23 1720         Core  Components/Risk Factors/Patient Goals Review   Personal Goals Review Weight Management/Obesity;Hypertension;Lipids Weight Management/Obesity;Hypertension;Lipids Weight Management/Obesity;Hypertension;Lipids     Review Kortlynn started intensive cardiac rehab on 02/08/23 and did well with exercise. Vital signs were stable Liset continues to do well with exercise. She stated she feels she is improving. Vital signs were stable Zarionna continues to do well with exercise. She stated she feels she is improving. Vital signs remain stable. Brea has lost 2.0 kg since starting the program     Expected Outcomes Velora will continue to participate in intensive cardiac rehab for exercise, nutrition and lifestyle modifications Vauna will continue to participate in intensive cardiac rehab for exercise, nutrition and lifestyle modifications Jhoselin will continue to participate in intensive cardiac rehab for exercise, nutrition and lifestyle modifications              Exercise Goals and Review:  Exercise Goals     Row Name  02/01/23 1331             Exercise Goals   Increase Physical Activity Yes       Intervention Provide advice, education, support and counseling about physical activity/exercise needs.;Develop an individualized exercise prescription for aerobic and resistive training based on initial evaluation findings, risk stratification, comorbidities and participant's personal goals.       Expected Outcomes Short Term: Attend rehab on a regular basis to increase amount of physical activity.;Long Term: Add in home exercise to make exercise part of routine and to increase amount of physical activity.;Long Term: Exercising regularly at least 3-5 days a week.       Increase Strength and Stamina Yes       Intervention Provide advice, education, support and counseling about physical activity/exercise needs.;Develop an individualized exercise prescription for aerobic and resistive training based on  initial evaluation findings, risk stratification, comorbidities and participant's personal goals.       Expected Outcomes Short Term: Increase workloads from initial exercise prescription for resistance, speed, and METs.;Short Term: Perform resistance training exercises routinely during rehab and add in resistance training at home;Long Term: Improve cardiorespiratory fitness, muscular endurance and strength as measured by increased METs and functional capacity ( )       Able to understand and use rate of perceived exertion (RPE) scale Yes       Intervention Provide education and explanation on how to use RPE scale       Expected Outcomes Short Term: Able to use RPE daily in rehab to express subjective intensity level;Long Term:  Able to use RPE to guide intensity level when exercising independently       Knowledge and understanding of Target Heart Rate Range (THRR) Yes       Intervention Provide education and explanation of THRR including how the numbers were predicted and where they are located for reference       Expected Outcomes Short Term: Able to state/look up THRR;Long Term: Able to use THRR to govern intensity when exercising independently;Short Term: Able to use daily as guideline for intensity in rehab       Understanding of Exercise Prescription Yes       Intervention Provide education, explanation, and written materials on patient's individual exercise prescription       Expected Outcomes Short Term: Able to explain program exercise prescription;Long Term: Able to explain home exercise prescription to exercise independently                Exercise Goals     Row Name 02/01/23 1331             Exercise Goals   Increase Physical Activity Yes       Intervention Provide advice, education, support and counseling about physical activity/exercise needs.;Develop an individualized exercise prescription for aerobic and resistive training based on initial evaluation findings, risk  stratification, comorbidities and participant's personal goals.       Expected Outcomes Short Term: Attend rehab on a regular basis to increase amount of physical activity.;Long Term: Add in home exercise to make exercise part of routine and to increase amount of physical activity.;Long Term: Exercising regularly at least 3-5 days a week.       Increase Strength and Stamina Yes       Intervention Provide advice, education, support and counseling about physical activity/exercise needs.;Develop an individualized exercise prescription for aerobic and resistive training based on initial evaluation findings, risk stratification, comorbidities and participant's personal goals.  Expected Outcomes Short Term: Increase workloads from initial exercise prescription for resistance, speed, and METs.;Short Term: Perform resistance training exercises routinely during rehab and add in resistance training at home;Long Term: Improve cardiorespiratory fitness, muscular endurance and strength as measured by increased METs and functional capacity ( )       Able to understand and use rate of perceived exertion (RPE) scale Yes       Intervention Provide education and explanation on how to use RPE scale       Expected Outcomes Short Term: Able to use RPE daily in rehab to express subjective intensity level;Long Term:  Able to use RPE to guide intensity level when exercising independently       Knowledge and understanding of Target Heart Rate Range (THRR) Yes       Intervention Provide education and explanation of THRR including how the numbers were predicted and where they are located for reference       Expected Outcomes Short Term: Able to state/look up THRR;Long Term: Able to use THRR to govern intensity when exercising independently;Short Term: Able to use daily as guideline for intensity in rehab       Understanding of Exercise Prescription Yes       Intervention Provide education, explanation, and written  materials on patient's individual exercise prescription       Expected Outcomes Short Term: Able to explain program exercise prescription;Long Term: Able to explain home exercise prescription to exercise independently                Exercise Goals Re-Evaluation:  Exercise Goals Re-Evaluation     Row Name 02/07/23 1107 03/09/23 1534 03/30/23 1429 04/18/23 1030       Exercise Goal Re-Evaluation   Exercise Goals Review Increase Physical Activity;Increase Strength and Stamina;Able to understand and use rate of perceived exertion (RPE) scale;Knowledge and understanding of Target Heart Rate Range (THRR);Understanding of Exercise Prescription Increase Physical Activity;Increase Strength and Stamina;Able to understand and use rate of perceived exertion (RPE) scale;Knowledge and understanding of Target Heart Rate Range (THRR);Understanding of Exercise Prescription Increase Physical Activity;Increase Strength and Stamina;Able to understand and use rate of perceived exertion (RPE) scale;Knowledge and understanding of Target Heart Rate Range (THRR);Understanding of Exercise Prescription Increase Physical Activity;Increase Strength and Stamina;Able to understand and use rate of perceived exertion (RPE) scale;Knowledge and understanding of Target Heart Rate Range (THRR);Understanding of Exercise Prescription    Comments Pt's first day in the CRP2 program. Pt understands the exercise Rx, RPE scale, and THRR. Reviewed METs and goals. Pt voices she is making progress on her goals, in fact pt voices she is exceeding her goals of improved diet and imrpoved strnegth and stamina. Peak METs are 3.93. Reviewed METs and goals. Pt continues to voice she is making progress on her goals of improved strengh and stamina and improved nutrition. Peak METs are 3.93. Pt graduated from the CRP2 program today. Pt had peak METs of 3.9.  Pt made good progress on her goals of: improved strength and stamina, learn more about diet and  heart health. Pt plans to continue walking at home and uing her free weights. Pt encouraged to achieve 150 minutes per week of exercise.    Expected Outcomes Will continue to monitor patient and progress exercise workloads as tolerated. Will continue to monitor patient and progress exercise workloads as tolerated. Will continue to monitor patient and progress exercise workloads as tolerated. Pt will continue to exercise at home.  Exercise Goals Re-Evaluation     Row Name 02/07/23 1107 03/09/23 1534 03/30/23 1429 04/18/23 1030       Exercise Goal Re-Evaluation   Exercise Goals Review Increase Physical Activity;Increase Strength and Stamina;Able to understand and use rate of perceived exertion (RPE) scale;Knowledge and understanding of Target Heart Rate Range (THRR);Understanding of Exercise Prescription Increase Physical Activity;Increase Strength and Stamina;Able to understand and use rate of perceived exertion (RPE) scale;Knowledge and understanding of Target Heart Rate Range (THRR);Understanding of Exercise Prescription Increase Physical Activity;Increase Strength and Stamina;Able to understand and use rate of perceived exertion (RPE) scale;Knowledge and understanding of Target Heart Rate Range (THRR);Understanding of Exercise Prescription Increase Physical Activity;Increase Strength and Stamina;Able to understand and use rate of perceived exertion (RPE) scale;Knowledge and understanding of Target Heart Rate Range (THRR);Understanding of Exercise Prescription    Comments Pt's first day in the CRP2 program. Pt understands the exercise Rx, RPE scale, and THRR. Reviewed METs and goals. Pt voices she is making progress on her goals, in fact pt voices she is exceeding her goals of improved diet and imrpoved strnegth and stamina. Peak METs are 3.93. Reviewed METs and goals. Pt continues to voice she is making progress on her goals of improved strengh and stamina and improved nutrition. Peak  METs are 3.93. Pt graduated from the CRP2 program today. Pt had peak METs of 3.9.  Pt made good progress on her goals of: improved strength and stamina, learn more about diet and heart health. Pt plans to continue walking at home and uing her free weights. Pt encouraged to achieve 150 minutes per week of exercise.    Expected Outcomes Will continue to monitor patient and progress exercise workloads as tolerated. Will continue to monitor patient and progress exercise workloads as tolerated. Will continue to monitor patient and progress exercise workloads as tolerated. Pt will continue to exercise at home.             Nutrition & Weight - Outcomes:  Pre Biometrics - 02/01/23 1024       Pre Biometrics   Waist Circumference 37.75 inches    Hip Circumference 44 inches    Waist to Hip Ratio 0.86 %    Triceps Skinfold 30 mm    % Body Fat 41.7 %    Grip Strength 33 kg    Flexibility 17.5 in    Single Leg Stand 30 seconds             Post Biometrics - 04/15/23 0842        Post  Biometrics   Height 5' 4.5" (1.638 m)    Weight 76.9 kg    Waist Circumference 37.5 inches    Hip Circumference 42.5 inches    Waist to Hip Ratio 0.88 %    BMI (Calculated) 28.66    Triceps Skinfold 27 mm    % Body Fat 40.4 %    Grip Strength 33 kg    Flexibility 18.75 in    Single Leg Stand 30 seconds             Nutrition:  Nutrition Therapy & Goals - 04/04/23 0928       Nutrition Therapy   Diet heart healthy diet    Drug/Food Interactions Statins/Certain Fruits      Personal Nutrition Goals   Nutrition Goal Patient to identify strategies for reducing cardiovascular risk by attending the [facility] education and nutrition series weekly.   goal met.   Personal Goal #2 Patient to improve diet quality  by using the plate method as a guide for meal planning to include lean protein/plant protein, fruits, vegetables, whole grains, nonfat dairy as part of a well-balanced diet.   goal in action.    Personal Goal #3 Patient to limit sodium intake 1500mg  per day   goal in action.   Comments Goals in action. [place] continues to attend the [facility] education and nutrition series regularly. Cerra reports that she has started making many dietary changes including reduced sodium, increased dietary fiber, reduced saturated fat, and reading food labels. Her husband is very supportive as well. She is down 4.4# since starting with our program. Triglycerides remain elevated; she denies alcohol intake, denies refined carbohydrates/simple sugar,etc; lipids managed by PCP.  Patient will benefit from participation in intensive cardiac rehab for nutrition, exercise, and lifestyle modification.      Intervention Plan   Intervention Prescribe, educate and counsel regarding individualized specific dietary modifications aiming towards targeted core components such as weight, hypertension, lipid management, diabetes, heart failure and other comorbidities.;Nutrition handout(s) given to patient.    Expected Outcomes Short Term Goal: Understand basic principles of dietary content, such as calories, fat, sodium, cholesterol and nutrients.;Long Term Goal: Adherence to prescribed nutrition plan.             Nutrition Discharge:  Nutrition Assessments - 04/18/23 1445       Rate Your Plate Scores   Pre Score 76    Post Score 80             Education Questionnaire Score:  Knowledge Questionnaire Score - 04/15/23 1610       Knowledge Questionnaire Score   Post Score 23/24             Goals reviewed with patient; copy given to patient.Pt graduates from  Intensive/Traditional cardiac rehab program on 04/18/23  with completion of  30  exercise and  30 education sessions. Pt maintained good attendance and progressed nicely during their participation in rehab as evidenced by increased MET level. Oveda increased her distance on her post exercise walk test by 556 feet and lost 2.8 kg!   Medication list  reconciled. Repeat  PHQ score- 1 .  Pt has made significant lifestyle changes and should be commended for her success.Cymantha  achieved their goals during cardiac rehab.   Pt plans to continue exercise at walking, doing weights. We are proud of Katherline progress!Thayer Headings RN BSN

## 2023-04-25 ENCOUNTER — Ambulatory Visit: Payer: Medicare Other | Admitting: Cardiology

## 2023-04-25 ENCOUNTER — Encounter: Payer: Self-pay | Admitting: Cardiology

## 2023-04-25 VITALS — BP 112/74 | HR 81 | Resp 16 | Ht 64.0 in | Wt 163.4 lb

## 2023-04-25 DIAGNOSIS — E782 Mixed hyperlipidemia: Secondary | ICD-10-CM

## 2023-04-25 DIAGNOSIS — I10 Essential (primary) hypertension: Secondary | ICD-10-CM | POA: Diagnosis not present

## 2023-04-25 DIAGNOSIS — I2510 Atherosclerotic heart disease of native coronary artery without angina pectoris: Secondary | ICD-10-CM | POA: Diagnosis not present

## 2023-04-25 DIAGNOSIS — Z95820 Peripheral vascular angioplasty status with implants and grafts: Secondary | ICD-10-CM

## 2023-04-25 DIAGNOSIS — I700 Atherosclerosis of aorta: Secondary | ICD-10-CM

## 2023-04-25 MED ORDER — OLMESARTAN MEDOXOMIL 20 MG PO TABS
10.0000 mg | ORAL_TABLET | Freq: Every day | ORAL | 0 refills | Status: AC
Start: 2023-04-25 — End: 2024-05-03

## 2023-04-25 MED ORDER — ATORVASTATIN CALCIUM 40 MG PO TABS
40.0000 mg | ORAL_TABLET | Freq: Every day | ORAL | Status: DC
Start: 2023-04-25 — End: 2023-08-12

## 2023-04-25 NOTE — Progress Notes (Signed)
ID:  DWANNA Kerr, DOB 1951-09-02, MRN [id number]  PCP:  Georgann Housekeeper, MD  Cardiologist:  Tessa Lerner, DO, [facility] (established care 12/21/2022)  Date: 04/25/23 Last Office Visit: 01/24/2023  Chief Complaint  Patient presents with   Coronary artery disease involving native coronary artery of   Follow-up    HPI  Amy Kerr is a 72 y.o. Caucasian female whose past medical history and cardiovascular risk factors include: History of unstable angina 12/2022 status post PCI to OM1, hypertension, hyperlipidemia, hypertriglyceridemia, GERD, vitamin D deficiency, atherosclerosis of the aorta.  In March 2024 she presented with symptoms of unstable angina underwent heart catheterization and had intervention to her OM1.   Since the last office visit no chest pain or heart failure symptoms. Continues to be in cardiac rehab.  With lifestyle changes patient has lost approximately 13 pounds she is congratulated on her efforts through her weight loss journey.  Patient did not tolerate a higher dose of Lipitor at 80 mg p.o. nightly and is currently on 40 mg p.o. nightly.  Given her hypertriglyceridemia she was placed on fenofibrate and was not able to tolerate that medication as well due to GI upset.  She has some nonspecific bruising on her arms likely secondary to antiplatelets.  FUNCTIONAL STATUS: No structured exercise program or daily routine.   ALLERGIES: Allergies  Allergen Reactions   Codeine Nausea Only   Jardiance [Empagliflozin] Nausea Only and Other (See Comments)   [name] [Morphine] Nausea Only   Zinc Nausea Only    MEDICATION LIST PRIOR TO VISIT: Current Meds  Medication Sig   aspirin EC 81 MG tablet Take 1 tablet (81 mg total) by mouth daily. Swallow whole.   metoprolol succinate (TOPROL XL) 25 MG 24 hr tablet Take 1 tablet (25 mg total) by mouth every morning. Hold if systolic blood pressure (top number) less than 100 mmHg or pulse less than 60 bpm.   nitroGLYCERIN  (NITROSTAT) 0.4 MG SL tablet Place 1 tablet (0.4 mg total) under the tongue every 5 (five) minutes as needed for chest pain. If you require more than two tablets five minutes apart go to the nearest ER via EMS.   olmesartan (BENICAR) 20 MG tablet Take 0.5 tablets (10 mg total) by mouth daily at 10 pm.   ticagrelor (BRILINTA) 90 MG TABS tablet Take 1 tablet (90 mg total) by mouth 2 (two) times daily.   [DISCONTINUED] amLODipine (NORVASC) 10 MG tablet Take 1 tablet (10 mg total) by mouth every morning. (Patient taking differently: Take 10 mg by mouth daily.)   [DISCONTINUED] atorvastatin (LIPITOR) 80 MG tablet Take 1 tablet (80 mg total) by mouth daily.     PAST MEDICAL HISTORY: Past Medical History:  Diagnosis Date   Coronary artery disease    Hyperlipidemia    Hypertension     PAST SURGICAL HISTORY: Past Surgical History:  Procedure Laterality Date   COLONOSCOPY N/A 10/23/2013   Procedure: COLONOSCOPY;  Surgeon: Charolett Bumpers, MD;  Location: [facility];  Service: Endoscopy;  Laterality: N/A;   CORONARY STENT INTERVENTION N/A 12/21/2022   Procedure: CORONARY STENT INTERVENTION;  Surgeon: Elder Negus, MD;  Location: [facility] INVASIVE CV LAB;  Service: Cardiovascular;  Laterality: N/A;   GALLBLADDER SURGERY     LAPAROSCOPIC APPENDECTOMY N/A 03/22/2013   Procedure: APPENDECTOMY LAPAROSCOPIC;  Surgeon: Currie Paris, MD;  Location: [facility] OR;  Service: General;  Laterality: N/A;   LEFT HEART CATH AND CORONARY ANGIOGRAPHY N/A 12/21/2022   Procedure: LEFT  HEART CATH AND CORONARY ANGIOGRAPHY;  Surgeon: Elder Negus, MD;  Location: [facility] INVASIVE CV LAB;  Service: Cardiovascular;  Laterality: N/A;    FAMILY HISTORY: The patient family history includes Cancer - Cervical in her mother; Esophageal cancer in her sister; Heart disease in her brother and father; Stomach cancer in her brother.  SOCIAL HISTORY:  The patient  reports that she has never smoked. She has never used smokeless  tobacco. She reports that she does not drink alcohol and does not use drugs.  REVIEW OF SYSTEMS: Review of Systems  Cardiovascular:  Negative for chest pain, claudication, dyspnea on exertion, irregular heartbeat, leg swelling, near-syncope, orthopnea, palpitations, paroxysmal nocturnal dyspnea and syncope.  Respiratory:  Negative for shortness of breath.   Hematologic/Lymphatic: Negative for bleeding problem.  Musculoskeletal:  Negative for muscle cramps and myalgias.  Neurological:  Negative for dizziness and light-headedness.    PHYSICAL EXAM:    04/25/2023   10:32 AM 04/15/2023    8:42 AM 02/01/2023   10:24 AM  Vitals with BMI  Height 5\' 4"  5' 4.5" 5' 4.5"  Weight 163 lbs 6 oz 169 lbs 9 oz 176 lbs 6 oz  BMI 28.03 28.66 29.82  Systolic 112  118  Diastolic 74  78  Pulse 81  70    Physical Exam  Constitutional: No distress.  Age appropriate, hemodynamically stable.   Neck: No JVD present.  Cardiovascular: Normal rate, regular rhythm, S1 normal, S2 normal, intact distal pulses and normal pulses. Exam reveals no gallop, no S3 and no S4.  No murmur heard. Pulmonary/Chest: Effort normal and breath sounds normal. No stridor. She has no wheezes. She has no rales.  Abdominal: Soft. Bowel sounds are normal. She exhibits no distension. There is no abdominal tenderness.  Musculoskeletal:        General: No edema.     Cervical back: Neck supple.  Neurological: She is alert and oriented to person, place, and time. She has intact cranial nerves (2-12).  Skin: Skin is warm and moist.   CARDIAC DATABASE: EKG: January 24, 2023: Sinus rhythm, 69 bpm, TWI in the lateral leads (improving compared to last ECG tracing), without underlying injury pattern.  Echocardiogram: 12/23/2022:  Normal LV systolic function with visual EF 55-60%. Left ventricle cavity  is normal in size. Normal left ventricular wall thickness. Normal global  wall motion. Indeterminate diastolic filling pattern. Calculated  EF 60%.  Mild (Grade I) mitral regurgitation. Mild calcification of the mitral  valve annulus. Mild mitral valve leaflet thickening.  Structurally normal tricuspid valve with no regurgitation. No evidence of  pulmonary hypertension.  No prior available for comparison.   Stress Testing: No results found for this or any previous visit from the past 1095 days.  Heart Catheterization: 12/21/2022 LM: Normal LAD: Mid 30% disease         High diag 1 with mild luminal irregularities Lcx: Prox OM1 with focal 95% stenosis RCA: Mid tandem 30% and 40% stenoses   LVEDP normal LVEF normal   Presentation with unstable angina Successful percutaneous coronary intervention prox OM1        PTCA and stent placement 3.0 X 16 mm Synergy drug-eluting stent        Post dilatation with 3.0X12 mm [place] balloon up to 20 atm Medical management for rest of the nonobstructive disease      LABORATORY DATA: External Labs: Collected: 12/06/2022 provided by referring physician. A1c 5.7 Hemoglobin 14.2, hematocrit 42.8% BUN 14, creatinine 1.02 Sodium 141, potassium 4.5,  chloride 107, bicarb 28. AST 24, ALT 30, alkaline phosphatase 66  TSH 2.44 Total cholesterol 167, triglycerides 219, HDL 46, LDL calculated 85, non-HDL 122 High sensitive troponin 13  Lab Results  Component Value Date   CHOL 154 03/03/2023   HDL 38 (L) 03/03/2023   LDLCALC 80 03/03/2023   LDLDIRECT 72 03/03/2023   TRIG 216 (H) 03/03/2023      IMPRESSION:    ICD-10-CM   1. Coronary artery disease involving native coronary artery of native heart without angina pectoris  I25.10 CMP14+EGFR    LDL cholesterol, direct    Lipid Panel With LDL/HDL Ratio    atorvastatin (LIPITOR) 40 MG tablet    olmesartan (BENICAR) 20 MG tablet    2. S/P angioplasty with stent  Z95.820 CMP14+EGFR    LDL cholesterol, direct    Lipid Panel With LDL/HDL Ratio    atorvastatin (LIPITOR) 40 MG tablet    3. Atherosclerosis of aorta (HCC)  I70.0 CMP14+EGFR     LDL cholesterol, direct    Lipid Panel With LDL/HDL Ratio    atorvastatin (LIPITOR) 40 MG tablet    4. Benign hypertension  I10 olmesartan (BENICAR) 20 MG tablet    5. Mixed hyperlipidemia  E78.2         RECOMMENDATIONS: FRANCESA EUGENIO is a 72 y.o. Caucasian female whose past medical history and cardiac risk factors include: Hypertension, hyperlipidemia, GERD, vitamin D deficiency, atherosclerosis of the aorta.  Coronary artery disease involving native coronary artery of native heart without angina pectoris S/P angioplasty with stent Presented with unstable angina in March 2024. Underwent angiography urgently and was noted to have obstructive disease in the OM distribution status post coronary intervention. Dual antiplatelet therapy for 12 months. Continues to be on Lipitor 40 mg p.o. nightly. Was unable to tolerate higher dose of Lipitor and unable to tolerate fenofibrate in the past due to hypertriglyceridemia. No use of sublingual nitroglycerin tablets since the last office visit. In the past patient was on Jardiance and developed acute kidney injury and at that time olmesartan was discontinued.  However, her renal function as of May 2024 is relatively at baseline.  Would like to get her back on ARB's given the cardio/nephro protective properties.  Will discontinue amlodipine 10 mg p.o. daily.  And restart olmesartan at 10 mg p.o. daily with labs in 1 week to evaluate kidney function.  Atherosclerosis of aorta (HCC) Continue antiplatelet therapy and statin therapy  Benign hypertension Home and office blood pressures are well-controlled. Discontinue amlodipine and restarting ARB as discussed above   Mixed hyperlipidemia Will transition her from Crestor to Lipitor as per her wishes.   Did not tolerate higher dose of Lipitor. Did not tolerate fenofibrate. Since she is lost 13 pounds at last office visit would like to reevaluate her lipids prior to making additional  recommendations. Recommended goal LDL <55 mg/dL and TG less than 409 mg/dL  FINAL MEDICATION LIST END OF ENCOUNTER: Meds ordered this encounter  Medications   atorvastatin (LIPITOR) 40 MG tablet    Sig: Take 1 tablet (40 mg total) by mouth daily.   olmesartan (BENICAR) 20 MG tablet    Sig: Take 0.5 tablets (10 mg total) by mouth daily at 10 pm.    Dispense:  45 tablet    Refill:  0    Medications Discontinued During This Encounter  Medication Reason   fenofibrate (TRICOR) 145 MG tablet Patient Preference   atorvastatin (LIPITOR) 80 MG tablet    amLODipine (NORVASC) 10  MG tablet Change in therapy      Current Outpatient Medications:    aspirin EC 81 MG tablet, Take 1 tablet (81 mg total) by mouth daily. Swallow whole., Disp: 90 tablet, Rfl: 3   metoprolol succinate (TOPROL XL) 25 MG 24 hr tablet, Take 1 tablet (25 mg total) by mouth every morning. Hold if systolic blood pressure (top number) less than 100 mmHg or pulse less than 60 bpm., Disp: 30 tablet, Rfl: 11   nitroGLYCERIN (NITROSTAT) 0.4 MG SL tablet, Place 1 tablet (0.4 mg total) under the tongue every 5 (five) minutes as needed for chest pain. If you require more than two tablets five minutes apart go to the nearest ER via EMS., Disp: 30 tablet, Rfl: 0   olmesartan (BENICAR) 20 MG tablet, Take 0.5 tablets (10 mg total) by mouth daily at 10 pm., Disp: 45 tablet, Rfl: 0   ticagrelor (BRILINTA) 90 MG TABS tablet, Take 1 tablet (90 mg total) by mouth 2 (two) times daily., Disp: 180 tablet, Rfl: 3   atorvastatin (LIPITOR) 40 MG tablet, Take 1 tablet (40 mg total) by mouth daily., Disp: , Rfl:    Cholecalciferol (VITAMIN D-3) 25 MCG (1000 UT) CAPS, Take 2,000 Units by mouth daily. (Patient not taking: Reported on 04/18/2023), Disp: , Rfl:   No orders of the defined types were placed in this encounter.   There are no Patient Instructions on file for this visit.   --Continue cardiac medications as reconciled in final medication  list. --Return in about 3 months (around 07/26/2023) for Follow up, CAD. or sooner if needed. --Continue follow-up with your primary care physician regarding the management of your other chronic comorbid conditions.  Patient's questions and concerns were addressed to her satisfaction. She voices understanding of the instructions provided during this encounter.   This note was created using a voice recognition software as a result there may be grammatical errors inadvertently enclosed that do not reflect the nature of this encounter. Every attempt is made to correct such errors.  Tessa Lerner, [place], [facility]  Pager:  [phone] Office: [phone]

## 2023-05-04 DIAGNOSIS — I700 Atherosclerosis of aorta: Secondary | ICD-10-CM | POA: Diagnosis not present

## 2023-05-04 DIAGNOSIS — Z95820 Peripheral vascular angioplasty status with implants and grafts: Secondary | ICD-10-CM | POA: Diagnosis not present

## 2023-05-04 DIAGNOSIS — I2510 Atherosclerotic heart disease of native coronary artery without angina pectoris: Secondary | ICD-10-CM | POA: Diagnosis not present

## 2023-05-09 NOTE — Progress Notes (Signed)
Patient states she is willing to try Vascepa but a low dose.

## 2023-05-15 ENCOUNTER — Other Ambulatory Visit: Payer: Self-pay | Admitting: Cardiology

## 2023-05-15 DIAGNOSIS — I2510 Atherosclerotic heart disease of native coronary artery without angina pectoris: Secondary | ICD-10-CM

## 2023-05-15 DIAGNOSIS — E781 Pure hyperglyceridemia: Secondary | ICD-10-CM

## 2023-05-15 DIAGNOSIS — I700 Atherosclerosis of aorta: Secondary | ICD-10-CM

## 2023-05-15 DIAGNOSIS — Z95820 Peripheral vascular angioplasty status with implants and grafts: Secondary | ICD-10-CM

## 2023-05-15 MED ORDER — VASCEPA 1 G PO CAPS
1.0000 g | ORAL_CAPSULE | Freq: Two times a day (BID) | ORAL | 0 refills | Status: DC
Start: 2023-05-15 — End: 2023-08-10

## 2023-05-15 NOTE — Progress Notes (Signed)
Labs 05/04/2023  Renal function remained stable after transitioning from amlodipine to olmesartan. LDL is well-controlled. Triglycerides are still not at goal.   Did not tolerate fenofibrate in the past.   Recommend continued lifestyle modification unless she is willing to try Vascepa to help reduce triglyceride levels.  Patient stated to staff that she will willing to try Vascepa at low dose.   Nonna Renninger [place], DO, [facility]

## 2023-05-17 DIAGNOSIS — N816 Rectocele: Secondary | ICD-10-CM | POA: Diagnosis not present

## 2023-05-17 DIAGNOSIS — Z1231 Encounter for screening mammogram for malignant neoplasm of breast: Secondary | ICD-10-CM | POA: Diagnosis not present

## 2023-06-08 DIAGNOSIS — Z955 Presence of coronary angioplasty implant and graft: Secondary | ICD-10-CM | POA: Diagnosis not present

## 2023-06-08 DIAGNOSIS — E782 Mixed hyperlipidemia: Secondary | ICD-10-CM | POA: Diagnosis not present

## 2023-06-08 DIAGNOSIS — N1831 Chronic kidney disease, stage 3a: Secondary | ICD-10-CM | POA: Diagnosis not present

## 2023-06-08 DIAGNOSIS — I2510 Atherosclerotic heart disease of native coronary artery without angina pectoris: Secondary | ICD-10-CM | POA: Diagnosis not present

## 2023-06-08 DIAGNOSIS — I10 Essential (primary) hypertension: Secondary | ICD-10-CM | POA: Diagnosis not present

## 2023-06-08 DIAGNOSIS — I700 Atherosclerosis of aorta: Secondary | ICD-10-CM | POA: Diagnosis not present

## 2023-07-04 DIAGNOSIS — H43813 Vitreous degeneration, bilateral: Secondary | ICD-10-CM | POA: Diagnosis not present

## 2023-07-04 DIAGNOSIS — H52223 Regular astigmatism, bilateral: Secondary | ICD-10-CM | POA: Diagnosis not present

## 2023-07-04 DIAGNOSIS — H04123 Dry eye syndrome of bilateral lacrimal glands: Secondary | ICD-10-CM | POA: Diagnosis not present

## 2023-07-04 DIAGNOSIS — H26491 Other secondary cataract, right eye: Secondary | ICD-10-CM | POA: Diagnosis not present

## 2023-07-04 DIAGNOSIS — Z961 Presence of intraocular lens: Secondary | ICD-10-CM | POA: Diagnosis not present

## 2023-07-21 DIAGNOSIS — I10 Essential (primary) hypertension: Secondary | ICD-10-CM | POA: Diagnosis not present

## 2023-07-21 DIAGNOSIS — T148XXA Other injury of unspecified body region, initial encounter: Secondary | ICD-10-CM | POA: Diagnosis not present

## 2023-07-26 ENCOUNTER — Ambulatory Visit: Payer: Self-pay | Admitting: Cardiology

## 2023-07-26 ENCOUNTER — Ambulatory Visit: Payer: Medicare Other | Admitting: Cardiology

## 2023-08-04 ENCOUNTER — Telehealth: Payer: Self-pay | Admitting: Cardiology

## 2023-08-04 DIAGNOSIS — E781 Pure hyperglyceridemia: Secondary | ICD-10-CM

## 2023-08-04 DIAGNOSIS — I2510 Atherosclerotic heart disease of native coronary artery without angina pectoris: Secondary | ICD-10-CM

## 2023-08-04 NOTE — Telephone Encounter (Signed)
Patient would like to know if Dr. Odis Hollingshead still wants to have lab work before she is seen.

## 2023-08-04 NOTE — Telephone Encounter (Signed)
Spoke with the patient ans advised that per last office visit with Dr. Odis Hollingshead (see below) he would like a repeat lipids prior to her next appointment. Patient will come by the office tomorrow for lab work. Labs have been ordered.  Mixed hyperlipidemia Will transition her from Crestor to Lipitor as per her wishes.   Did not tolerate higher dose of Lipitor. Did not tolerate fenofibrate. Since she is lost 13 pounds at last office visit would like to reevaluate her lipids prior to making additional recommendations. Recommended goal LDL <55 mg/dL and TG less than 324 mg/dL

## 2023-08-05 ENCOUNTER — Telehealth: Payer: Self-pay

## 2023-08-05 DIAGNOSIS — E781 Pure hyperglyceridemia: Secondary | ICD-10-CM

## 2023-08-05 DIAGNOSIS — E782 Mixed hyperlipidemia: Secondary | ICD-10-CM

## 2023-08-05 DIAGNOSIS — I2510 Atherosclerotic heart disease of native coronary artery without angina pectoris: Secondary | ICD-10-CM

## 2023-08-05 NOTE — Telephone Encounter (Signed)
Released pt order for lipid panel and added a direct LDL and CMP for the pt to check her cholesterol. Orders have all been released.

## 2023-08-06 LAB — LIPID PANEL
Chol/HDL Ratio: 3.4 ratio (ref 0.0–4.4)
Cholesterol, Total: 151 mg/dL (ref 100–199)
HDL: 44 mg/dL (ref >39)
LDL Chol Calc (NIH): 78 mg/dL (ref 0–99)
Triglycerides: 170 mg/dL — ABNORMAL HIGH (ref 0–149)
VLDL Cholesterol Cal: 29 mg/dL (ref 5–40)

## 2023-08-06 LAB — COMPREHENSIVE METABOLIC PANEL
ALT: 29 [IU]/L (ref 0–32)
AST: 26 [IU]/L (ref 0–40)
Albumin: 3.5 g/dL — ABNORMAL LOW (ref 3.8–4.8)
Alkaline Phosphatase: 89 [IU]/L (ref 44–121)
BUN/Creatinine Ratio: 16 (ref 12–28)
BUN: 17 mg/dL (ref 8–27)
Bilirubin Total: 0.5 mg/dL (ref 0.0–1.2)
CO2: 24 mmol/L (ref 20–29)
Calcium: 8.7 mg/dL (ref 8.7–10.3)
Chloride: 110 mmol/L — ABNORMAL HIGH (ref 96–106)
Creatinine, Ser: 1.04 mg/dL — ABNORMAL HIGH (ref 0.57–1.00)
Globulin, Total: 2.5 g/dL (ref 1.5–4.5)
Glucose: 103 mg/dL — ABNORMAL HIGH (ref 70–99)
Potassium: 4.6 mmol/L (ref 3.5–5.2)
Sodium: 142 mmol/L (ref 134–144)
Total Protein: 6 g/dL (ref 6.0–8.5)
eGFR: 57 mL/min/{1.73_m2} — ABNORMAL LOW (ref >59)

## 2023-08-06 LAB — LDL CHOLESTEROL, DIRECT: LDL Direct: 71 mg/dL (ref 0–99)

## 2023-08-10 ENCOUNTER — Ambulatory Visit: Payer: Medicare Other | Attending: Cardiology | Admitting: Cardiology

## 2023-08-10 ENCOUNTER — Encounter: Payer: Self-pay | Admitting: Cardiology

## 2023-08-10 VITALS — BP 100/64 | HR 64 | Resp 16 | Ht 64.0 in | Wt 158.8 lb

## 2023-08-10 DIAGNOSIS — I10 Essential (primary) hypertension: Secondary | ICD-10-CM

## 2023-08-10 DIAGNOSIS — I700 Atherosclerosis of aorta: Secondary | ICD-10-CM

## 2023-08-10 DIAGNOSIS — E782 Mixed hyperlipidemia: Secondary | ICD-10-CM

## 2023-08-10 DIAGNOSIS — E781 Pure hyperglyceridemia: Secondary | ICD-10-CM

## 2023-08-10 DIAGNOSIS — Z95820 Peripheral vascular angioplasty status with implants and grafts: Secondary | ICD-10-CM

## 2023-08-10 DIAGNOSIS — I2510 Atherosclerotic heart disease of native coronary artery without angina pectoris: Secondary | ICD-10-CM

## 2023-08-10 MED ORDER — ICOSAPENT ETHYL 1 G PO CAPS: 2.0000 g | ORAL_CAPSULE | Freq: Two times a day (BID) | ORAL | 6 refills | Status: DC

## 2023-08-10 NOTE — Patient Instructions (Addendum)
Medication Instructions:  Your physician has recommended you make the following change in your medication:   INCREASE Vascepa to 2g twice daily    *If you need a refill on your cardiac medications before your next appointment, please call your pharmacy*  Lab Work: FASTING lipid panel, direct LDL, and CMP in 6 weeks  If you have labs (blood work) drawn today and your tests are completely normal, you will receive your results only by: MyChart Message (if you have MyChart) OR A paper copy in the mail If you have any lab test that is abnormal or we need to change your treatment, we will call you to review the results.  Testing/Procedures: None ordered today.  Follow-Up: At [facility], you and your health needs are our priority.  As part of our continuing mission to provide you with exceptional heart care, we have created designated Provider Care Teams.  These Care Teams include your primary Cardiologist (physician) and Advanced Practice Providers (APPs -  Physician Assistants and Nurse Practitioners) who all work together to provide you with the care you need, when you need it.  Your next appointment:   5 month(s) (April 2025)  The format for your next appointment:   In Person  Provider:   Tessa Lerner, DO {

## 2023-08-10 NOTE — Progress Notes (Signed)
Cardiology Office Note:  .   Date:  08/10/2023  ID:  Amy Kerr, DOB 09/11/1951, MRN [id number] PCP:  Amy Housekeeper, MD  Former Cardiology Providers: None [facility] Providers Cardiologist:  Amy Lerner, DO , [facility] (established care March 2024) Electrophysiologist:  None  Click to update primary MD,subspecialty MD or APP then REFRESH:1}    Chief Complaint  Patient presents with   Coronary artery disease involving native coronary artery of   Follow-up    History of Present Illness: .   Amy Kerr is a 72 y.o. Caucasian female whose past medical history and cardiovascular risk factors includes: History of unstable angina 12/2022 status post PCI to OM1, hypertension, hyperlipidemia, hypertriglyceridemia, GERD, vitamin D deficiency, atherosclerosis of the aorta.   In March 2024 patient presented to the office with symptoms concerning for unstable angina.  She was recommended to undergo heart catheterization which was noted to have obstructive disease underwent coronary intervention.  Since then we have been focusing on improving her modifiable cardiovascular risk factors including lipid and triglyceride management.  She is unable to tolerate higher dose of atorvastatin and also unable to tolerate fenofibrate in the past.  In addition, when she was started on Jardiance she developed acute kidney injury and needed to be hospitalized.  At the last office visit amlodipine was discontinued and she was started back on olmesartan 10 mg p.o. daily.  Since last office visit she has been placed back on amlodipine by PCP due to fluctuating blood pressures.  She brings her blood pressure log in for review which is very well-controlled but at times is soft.  She takes all of her blood pressure medications in the morning as well.  In the interim she was started on Vascepa for triglyceride management and is doing well.  No reoccurrence of chest pain like what she had in March 2024.  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  Review of Systems: .   Review of Systems  Constitutional: Positive for weight loss.  Cardiovascular:  Negative for chest pain, claudication, irregular heartbeat, leg swelling, near-syncope, orthopnea, palpitations, paroxysmal nocturnal dyspnea and syncope.  Respiratory:  Negative for shortness of breath.   Hematologic/Lymphatic: Negative for bleeding problem.    Studies Reviewed:   EKG: EKG Interpretation Date/Time:  Wednesday August 10 2023 08:49:50 EST Ventricular Rate:  64 PR Interval:  220 QRS Duration:  78 QT Interval:  394 QTC Calculation: 406 R Axis:   19  Text Interpretation: Sinus rhythm with 1st degree A-V block Possible Anterior infarct , age undetermined When compared with ECG of 02-Jan-2023 07:44, First degree heart block IS NEW SINCE Since last tracing Confirmed by Amy Kerr [phone]) on 08/10/2023 9:14:44 AM  Echocardiogram: 12/23/2022:  Normal LV systolic function with visual EF 55-60%. Left ventricle cavity  is normal in size. Normal left ventricular wall thickness. Normal global  wall motion. Indeterminate diastolic filling pattern. Calculated EF 60%.  Mild (Grade I) mitral regurgitation. Mild calcification of the mitral  valve annulus. Mild mitral valve leaflet thickening.  Structurally normal tricuspid valve with no regurgitation. No evidence of  pulmonary hypertension.  No prior available for comparison.   Heart Catheterization: 12/21/2022 LM: Normal LAD: Mid 30% disease         High diag 1 with mild luminal irregularities Lcx: Prox OM1 with focal 95% stenosis RCA: Mid tandem 30%  and 40% stenoses   LVEDP normal LVEF normal   Presentation with unstable angina Successful percutaneous coronary intervention prox OM1        PTCA and stent placement 3.0 X 16 mm Synergy drug-eluting  stent        Post dilatation with 3.0X12 mm [place] balloon up to 20 atm Medical management for rest of the nonobstructive disease       RADIOLOGY: NA  Risk Assessment/Calculations:   NA   Labs:       Latest Ref Rng & Units 01/03/2023    1:41 AM 01/02/2023    8:07 AM 12/22/2022    1:41 AM  CBC  WBC 4.0 - 10.5 K/uL 6.2  7.2  8.7   Hemoglobin 12.0 - 15.0 g/dL 32.4  40.1  02.7   Hematocrit 36.0 - 46.0 % 37.7  43.7  38.6   Platelets 150 - 400 K/uL 210  271  251        Latest Ref Rng & Units 08/05/2023    9:27 AM 05/04/2023    9:13 AM 03/03/2023    8:55 AM  BMP  Glucose 70 - 99 mg/dL 253  89  664   BUN 8 - 27 mg/dL 17  16  18    Creatinine 0.57 - 1.00 mg/dL 4.03  4.74  2.59   BUN/Creat Ratio 12 - 28 16  14  15    Sodium 134 - 144 mmol/L 142  142  141   Potassium 3.5 - 5.2 mmol/L 4.6  5.1  4.4   Chloride 96 - 106 mmol/L 110  109  106   CO2 20 - 29 mmol/L 24  20  21    Calcium 8.7 - 10.3 mg/dL 8.7  8.9  9.0       Latest Ref Rng & Units 08/05/2023    9:27 AM 05/04/2023    9:13 AM 03/03/2023    8:55 AM  CMP  Glucose 70 - 99 mg/dL 563  89  875   BUN 8 - 27 mg/dL 17  16  18    Creatinine 0.57 - 1.00 mg/dL 6.43  3.29  5.18   Sodium 134 - 144 mmol/L 142  142  141   Potassium 3.5 - 5.2 mmol/L 4.6  5.1  4.4   Chloride 96 - 106 mmol/L 110  109  106   CO2 20 - 29 mmol/L 24  20  21    Calcium 8.7 - 10.3 mg/dL 8.7  8.9  9.0   Total Protein 6.0 - 8.5 g/dL 6.0  6.4  6.5   Total Bilirubin 0.0 - 1.2 mg/dL 0.5  0.6  0.7   Alkaline Phos 44 - 121 IU/L 89  88  95   AST 0 - 40 IU/L 26  25  19    ALT 0 - 32 IU/L 29  33  27     Lab Results  Component Value Date   CHOL 151 08/05/2023   HDL 44 08/05/2023   LDLCALC 78 08/05/2023   LDLDIRECT 71 08/05/2023   TRIG 170 (H) 08/05/2023   CHOLHDL 3.4 08/05/2023   Recent Labs    12/22/22 0141  LIPOA 42.6*   No components found for: "NTPROBNP" No results for input(s): "PROBNP" in the last 8760 hours. No results for input(s): "TSH" in the last 8760  hours.  LABORATORY DATA: External Labs: Collected: 12/06/2022 provided by referring physician. Total cholesterol 167, triglycerides 219, HDL 46, LDL calculated 85, non-HDL 122   Physical Exam:  Today's Vitals   08/10/23 0846  BP: 100/64  Pulse: 64  Resp: 16  SpO2: 98%  Weight: 158 lb 12.8 oz (72 kg)  Height: 5\' 4"  (1.626 m)   Body mass index is 27.26 kg/m. Wt Readings from Last 3 Encounters:  08/10/23 158 lb 12.8 oz (72 kg)  04/25/23 163 lb 6.4 oz (74.1 kg)  04/15/23 169 lb 8.5 oz (76.9 kg)    Physical Exam  Constitutional: No distress.  Age appropriate, hemodynamically stable.   Neck: No JVD present.  Cardiovascular: Normal rate, regular rhythm, S1 normal, S2 normal, intact distal pulses and normal pulses. Exam reveals no gallop, no S3 and no S4.  No murmur heard. Pulmonary/Chest: Effort normal and breath sounds normal. No stridor. She has no wheezes. She has no rales.  Abdominal: Soft. Bowel sounds are normal. She exhibits no distension. There is no abdominal tenderness.  Musculoskeletal:        General: No edema.     Cervical back: Neck supple.  Neurological: She is alert and oriented to person, place, and time. She has intact cranial nerves (2-12).  Skin: Skin is warm and moist.     Impression & Recommendation(s):  Impression:   ICD-10-CM   1. Coronary artery disease involving native coronary artery of native heart without angina pectoris  I25.10 EKG 12-Lead    2. S/P angioplasty with stent  Z95.820     3. Atherosclerosis of aorta (HCC)  I70.0     4. Mixed hyperlipidemia  E78.2 Comprehensive metabolic panel    LDL cholesterol, direct    Lipid panel    Lipid panel    LDL cholesterol, direct    Comprehensive metabolic panel    5. Pure hypertriglyceridemia  E78.1     6. Benign hypertension  I10        Recommendation(s):  Coronary artery disease involving native coronary artery of native heart without angina pectoris S/P angioplasty with stent Denies  anginal chest pain. EKG is nonischemic -first-degree AV block is new. No use of sublingual nitroglycerin tablets since the last office visit. Presented with unstable angina in March 2024 and underwent coronary intervention -now on dual antiplatelet therapy for 12 months.  Patient states that she is currently on Lipitor 80 mg p.o. nightly-she will call us back to confirm the dose. She is tolerated Vascepa since last office visit. Will increase the dose of Vascepa as noted below. Of note, did not tolerate Jardiance well in the past and developed acute kidney injury requiring her to be hospitalized.  Atherosclerosis of aorta (HCC) Continue antiplatelet therapy and statins  Mixed hyperlipidemia Pure hypertriglyceridemia Currently on Lipitor 80 mg p.o. daily and Vascepa 1 g twice daily. She will call us back to make sure that she is on the correct dose of Lipitor. Recent labs independently reviewed. Will increase Vascepa to 2 g twice daily with repeat lipids in 6 weeks to reevaluate therapy  Benign hypertension Office blood pressures are soft. She takes all of her blood pressure medications in the morning. Advised her to take Toprol-XL and amlodipine in the morning and olmesartan at night. She has also lost 32 pounds since March 2024 -likely will need less blood pressure pills. If her blood pressures are consistently less than 120 mmHg I have asked her to reduce amlodipine to 5 mg p.o. daily or reach out to either myself or PCP for further guidance.    Orders Placed:  Orders Placed This Encounter  Procedures   Comprehensive metabolic panel  Standing Status:   Future    Number of Occurrences:   1    Standing Expiration Date:   08/09/2024    Order Specific Question:   Has the patient fasted?    Answer:   Yes   LDL cholesterol, direct    Standing Status:   Future    Number of Occurrences:   1    Standing Expiration Date:   08/09/2024   Lipid panel    Standing Status:   Future     Number of Occurrences:   1    Standing Expiration Date:   08/09/2024    Order Specific Question:   Has the patient fasted?    Answer:   Yes   EKG 12-Lead    As part of medical decision making labs dated 08/05/2023, EKG, prior heart catheterization report and echocardiogram reviewed as part of today's encounter.  Final Medication List:    Meds ordered this encounter  Medications   icosapent Ethyl (VASCEPA) 1 g capsule    Sig: Take 2 capsules (2 g total) by mouth 2 (two) times daily.    Dispense:  120 capsule    Refill:  6    Increasing to 2g twice daily    Medications Discontinued During This Encounter  Medication Reason   VASCEPA 1 g capsule Dose change     Current Outpatient Medications:    amLODipine (NORVASC) 10 MG tablet, Take 1 tablet by mouth daily., Disp: , Rfl:    aspirin EC 81 MG tablet, Take 1 tablet (81 mg total) by mouth daily. Swallow whole., Disp: 90 tablet, Rfl: 3   atorvastatin (LIPITOR) 40 MG tablet, Take 1 tablet (40 mg total) by mouth daily., Disp: , Rfl:    Cholecalciferol (VITAMIN D-3) 25 MCG (1000 UT) CAPS, Take 1,000 Units by mouth daily., Disp: , Rfl:    icosapent Ethyl (VASCEPA) 1 g capsule, Take 2 capsules (2 g total) by mouth 2 (two) times daily., Disp: 120 capsule, Rfl: 6   metoprolol succinate (TOPROL XL) 25 MG 24 hr tablet, Take 1 tablet (25 mg total) by mouth every morning. Hold if systolic blood pressure (top number) less than 100 mmHg or pulse less than 60 bpm., Disp: 30 tablet, Rfl: 11   nitroGLYCERIN (NITROSTAT) 0.4 MG SL tablet, Place 1 tablet (0.4 mg total) under the tongue every 5 (five) minutes as needed for chest pain. If you require more than two tablets five minutes apart go to the nearest ER via EMS., Disp: 30 tablet, Rfl: 0   olmesartan (BENICAR) 20 MG tablet, Take 0.5 tablets (10 mg total) by mouth daily at 10 pm., Disp: 45 tablet, Rfl: 0   ticagrelor (BRILINTA) 90 MG TABS tablet, Take 1 tablet (90 mg total) by mouth 2 (two) times daily.,  Disp: 180 tablet, Rfl: 3  Consent:   NA  Disposition:   April 2025 for CAD/PCI management  Patient may be asked to follow-up sooner based on the results of the above-mentioned testing.  Her questions and concerns were addressed to her satisfaction. She voices understanding of the recommendations provided during this encounter.    Signed, Amy Lerner, DO, [facility]  [facility]  [address] [place], [place] [address] 08/10/2023 7:07 PM

## 2023-08-11 ENCOUNTER — Telehealth: Payer: Self-pay | Admitting: Cardiology

## 2023-08-11 NOTE — Telephone Encounter (Signed)
Patient saw Dr. Odis Hollingshead yesterday and is calling to let him know she is taking atorvastatin (LIPITOR) 80 MG tablet daily.

## 2023-08-11 NOTE — Telephone Encounter (Signed)
Will forward this information to Dr. Odis Hollingshead and RN are a general FYI from the pt.

## 2023-08-12 ENCOUNTER — Other Ambulatory Visit: Payer: Self-pay

## 2023-08-12 DIAGNOSIS — Z95820 Peripheral vascular angioplasty status with implants and grafts: Secondary | ICD-10-CM

## 2023-08-12 DIAGNOSIS — I700 Atherosclerosis of aorta: Secondary | ICD-10-CM

## 2023-08-12 DIAGNOSIS — I2510 Atherosclerotic heart disease of native coronary artery without angina pectoris: Secondary | ICD-10-CM

## 2023-08-12 MED ORDER — ATORVASTATIN CALCIUM 40 MG PO TABS: 80.0000 mg | ORAL_TABLET | Freq: Every day | ORAL | Status: DC

## 2023-08-12 NOTE — Telephone Encounter (Signed)
Okay. Thanks for the update.   Amy Kerr [place], DO, [facility]

## 2023-08-12 NOTE — Telephone Encounter (Signed)
Can you please update the MAR to reflect the current dosage? Still continue the higher dose of the Vascepa.  Amy Schlagel [place], DO, [facility]

## 2023-08-12 NOTE — Progress Notes (Signed)
Pt called to let Dr. Odis Hollingshead know that she is currently taking 80mg  Atorvastatin (Lipitor). Dr. Odis Hollingshead requested to update the pt's chart to reflect the accurate dosing. Chart is now up to date.

## 2023-08-12 NOTE — Telephone Encounter (Signed)
Patient stated she tried the higher dose of Vascepa 2mg  BID. She had upset stomach & diarrhea, and she cannot tolerate higher dose of Vascepa. Patient stated she will try to go back to the Vascepa 1 mg BID. Informed patient that Dr. Odis Hollingshead with be informed. Lipitor dose was updated on patient's medication list.

## 2023-09-22 DIAGNOSIS — E782 Mixed hyperlipidemia: Secondary | ICD-10-CM | POA: Diagnosis not present

## 2023-09-22 LAB — LIPID PANEL
Chol/HDL Ratio: 3.2 {ratio} (ref 0.0–4.4)
Cholesterol, Total: 151 mg/dL (ref 100–199)
HDL: 47 mg/dL (ref >39)
LDL Chol Calc (NIH): 77 mg/dL (ref 0–99)
Triglycerides: 156 mg/dL — ABNORMAL HIGH (ref 0–149)
VLDL Cholesterol Cal: 27 mg/dL (ref 5–40)

## 2023-09-22 LAB — COMPREHENSIVE METABOLIC PANEL
ALT: 41 [IU]/L — ABNORMAL HIGH (ref 0–32)
AST: 29 [IU]/L (ref 0–40)
Albumin: 3.2 g/dL — ABNORMAL LOW (ref 3.8–4.8)
Alkaline Phosphatase: 93 [IU]/L (ref 44–121)
BUN/Creatinine Ratio: 15 (ref 12–28)
BUN: 14 mg/dL (ref 8–27)
Bilirubin Total: 0.4 mg/dL (ref 0.0–1.2)
CO2: 23 mmol/L (ref 20–29)
Calcium: 8.6 mg/dL — ABNORMAL LOW (ref 8.7–10.3)
Chloride: 109 mmol/L — ABNORMAL HIGH (ref 96–106)
Creatinine, Ser: 0.93 mg/dL (ref 0.57–1.00)
Globulin, Total: 2.6 g/dL (ref 1.5–4.5)
Glucose: 101 mg/dL — ABNORMAL HIGH (ref 70–99)
Potassium: 4.5 mmol/L (ref 3.5–5.2)
Sodium: 142 mmol/L (ref 134–144)
Total Protein: 5.8 g/dL — ABNORMAL LOW (ref 6.0–8.5)
eGFR: 65 mL/min/{1.73_m2} (ref >59)

## 2023-09-22 LAB — LDL CHOLESTEROL, DIRECT: LDL Direct: 67 mg/dL (ref 0–99)

## 2023-11-11 ENCOUNTER — Telehealth: Payer: Self-pay | Admitting: Cardiology

## 2023-11-11 NOTE — Telephone Encounter (Signed)
 Pt c/o swelling/edema: STAT if pt has developed SOB within 24 hours  If swelling, where is the swelling located? feet  How much weight have you gained and in what time span? none  Have you gained 2 pounds in a day or 5 pounds in a week? no  Do you have a log of your daily weights (if so, list)? Yes no weight gain  Are you currently taking a fluid pill? no  Are you currently SOB? no  Have you traveled recently in a car or plane for an extended period of time? no

## 2023-11-11 NOTE — Telephone Encounter (Signed)
 Tolia, Sunit, DO  Wael Maestas A, RN Cc: Blackwell, Mary P, RN Caller: Unspecified (Today,  8:13 AM) Likely due to Norvasc  as she is on 10mg  po qday. Since there is no weight gain or worsening shortness of breath - consider compression stocking and if swelling improves nothing more needs to be done. If swelling continues have her come in to either see me or APP for further evaluation.  Amy [place], DO, [facility]  Called and spoke with the patient regarding above recomendations. She states that she does have a pair of stocking. Advised to place in the morning when getting out of bed and to take off at night. Elevated legs as needed. Call back if no better to make a follow up appointment if needed.

## 2023-11-11 NOTE — Telephone Encounter (Signed)
 Pt c/o swelling/edema: STAT if pt has developed SOB within 24 hours   If swelling, where is the swelling located? feet   How much weight have you gained and in what time span? none   Have you gained 2 pounds in a day or 5 pounds in a week? no   Do you have a log of your daily weights (if so, list)? Yes no weight gain   Are you currently taking a fluid pill? no   Are you currently SOB? no   Have you traveled recently in a car or plane for an extended period of time? no     Called and spoke with patient who states that she has noticed an increase in bil feet swelling. It used to go away at night once she slept all night but now is not getting any better. Saw PCP in January and states he thought it might be related to her amlodipine . States has not had any changes in diet and is walking for more for exercise. Wants to know if she needs to see Dr Michele. Advised I will call her back with advice from the doctor.

## 2023-11-11 NOTE — Telephone Encounter (Signed)
 Likely due to Norvasc  as she is on 10mg  po qday.  Since there is no weight gain or worsening shortness of breath - consider compression stocking and if swelling improves nothing more needs to be done.  If swelling continues have her come in to either see me or APP for further evaluation.   Sherlonda Flater [place], DO, FACC

## 2023-12-01 ENCOUNTER — Ambulatory Visit: Payer: Medicare Other | Attending: Cardiology | Admitting: Cardiology

## 2023-12-01 VITALS — BP 120/58 | HR 57 | Resp 16 | Ht 64.0 in | Wt 156.8 lb

## 2023-12-01 DIAGNOSIS — E781 Pure hyperglyceridemia: Secondary | ICD-10-CM

## 2023-12-01 DIAGNOSIS — I2510 Atherosclerotic heart disease of native coronary artery without angina pectoris: Secondary | ICD-10-CM | POA: Diagnosis not present

## 2023-12-01 DIAGNOSIS — R600 Localized edema: Secondary | ICD-10-CM

## 2023-12-01 DIAGNOSIS — E782 Mixed hyperlipidemia: Secondary | ICD-10-CM

## 2023-12-01 DIAGNOSIS — I10 Essential (primary) hypertension: Secondary | ICD-10-CM | POA: Diagnosis not present

## 2023-12-01 DIAGNOSIS — I700 Atherosclerosis of aorta: Secondary | ICD-10-CM

## 2023-12-01 DIAGNOSIS — Z95820 Peripheral vascular angioplasty status with implants and grafts: Secondary | ICD-10-CM | POA: Diagnosis not present

## 2023-12-01 MED ORDER — AMLODIPINE BESYLATE 5 MG PO TABS: 5.0000 mg | ORAL_TABLET | Freq: Every day | ORAL | 3 refills | Status: AC

## 2023-12-01 NOTE — Progress Notes (Signed)
 Cardiology Office Note:  .    ID:  Amy Kerr, DOB Sep 06, 1951, MRN [id number] PCP:  Georgann Housekeeper, MD  Former Cardiology Providers: None [facility] Providers Cardiologist:  Tessa Lerner, DO , [facility] (established care March 2024) Electrophysiologist:  None  Click to update primary MD,subspecialty MD or APP then REFRESH:1}    Chief Complaint  Patient presents with   Coronary artery disease involving native coronary artery of   Follow-up    History of Present Illness: .   Amy Kerr is a 73 y.o. Caucasian female whose past medical history and cardiovascular risk factors includes: History of unstable angina 12/2022 status post PCI to OM1, hypertension, hyperlipidemia, hypertriglyceridemia, GERD, vitamin D deficiency, atherosclerosis of the aorta.   In March 2024 patient presented to the office with symptoms concerning for unstable angina.  She underwent heart catheterization which was noted to have obstructive disease underwent coronary intervention. Since then we have been focusing on improving her modifiable cardiovascular risk factors including lipid and triglyceride management.  She is unable to tolerate higher dose of atorvastatin and also unable to tolerate fenofibrate in the past.  In addition, when she was started on Jardiance she developed acute kidney injury and needed to be hospitalized.    Patient was last seen in the office in November 2024 and presents today for follow-up.  Patient was unable to tolerate the higher dose of Vascepa 2 g twice daily and therefore reduce it back down to 1 g twice daily.  Most recent lipids from 09/22/2023 note stable triglycerides and LDL levels.  Renal function also remains stable.  Patient presents today to the office accompanied by her husband due to lower extremity swelling.  On her medication list she is supposed to take olmesartan 10 mg p.o. daily but by accident she been taking 20 mg p.o. daily.  Her current dose of amlodipine is  10 mg p.o. daily.  She denies orthopnea or PND.  Review of Systems: .   Review of Systems  Constitutional: Positive for weight loss.  Cardiovascular:  Negative for chest pain, claudication, irregular heartbeat, leg swelling, near-syncope, orthopnea, palpitations, paroxysmal nocturnal dyspnea and syncope.  Respiratory:  Negative for shortness of breath.   Hematologic/Lymphatic: Negative for bleeding problem.    Studies Reviewed:    Echocardiogram: 12/23/2022:  Normal LV systolic function with visual EF 55-60%. Left ventricle cavity  is normal in size. Normal left ventricular wall thickness. Normal global  wall motion. Indeterminate diastolic filling pattern. Calculated EF 60%.  Mild (Grade I) mitral regurgitation. Mild calcification of the mitral  valve annulus. Mild mitral valve leaflet thickening.  Structurally normal tricuspid valve with no regurgitation. No evidence of  pulmonary hypertension.  No prior available for comparison.   Heart Catheterization: 12/21/2022 LM: Normal LAD: Mid 30% disease         High diag 1 with mild luminal irregularities Lcx: Prox OM1 with focal 95% stenosis RCA: Mid tandem 30% and 40% stenoses   LVEDP normal LVEF normal   Presentation with unstable angina Successful percutaneous coronary intervention prox OM1        PTCA and stent placement 3.0 X 16 mm Synergy drug-eluting stent        Post dilatation with 3.0X12 mm [place] balloon up to 20 atm Medical management for rest of the nonobstructive disease       RADIOLOGY: NA  Risk Assessment/Calculations:   NA   Labs:       Latest Ref Rng & Units 01/03/2023  1:41 AM 01/02/2023    8:07 AM 12/22/2022    1:41 AM  CBC  WBC 4.0 - 10.5 K/uL 6.2  7.2  8.7   Hemoglobin 12.0 - 15.0 g/dL 16.1  09.6  04.5   Hematocrit 36.0 - 46.0 % 37.7  43.7  38.6   Platelets 150 - 400 K/uL 210  271  251        Latest Ref Rng & Units 09/22/2023    8:09 AM 08/05/2023    9:27 AM 05/04/2023    9:13 AM  BMP   Glucose 70 - 99 mg/dL 409  811  89   BUN 8 - 27 mg/dL 14  17  16    Creatinine 0.57 - 1.00 mg/dL 9.14  7.82  9.56   BUN/Creat Ratio 12 - 28 15  16  14    Sodium 134 - 144 mmol/L 142  142  142   Potassium 3.5 - 5.2 mmol/L 4.5  4.6  5.1   Chloride 96 - 106 mmol/L 109  110  109   CO2 20 - 29 mmol/L 23  24  20    Calcium 8.7 - 10.3 mg/dL 8.6  8.7  8.9       Latest Ref Rng & Units 09/22/2023    8:09 AM 08/05/2023    9:27 AM 05/04/2023    9:13 AM  CMP  Glucose 70 - 99 mg/dL 213  086  89   BUN 8 - 27 mg/dL 14  17  16    Creatinine 0.57 - 1.00 mg/dL 5.78  4.69  6.29   Sodium 134 - 144 mmol/L 142  142  142   Potassium 3.5 - 5.2 mmol/L 4.5  4.6  5.1   Chloride 96 - 106 mmol/L 109  110  109   CO2 20 - 29 mmol/L 23  24  20    Calcium 8.7 - 10.3 mg/dL 8.6  8.7  8.9   Total Protein 6.0 - 8.5 g/dL 5.8  6.0  6.4   Total Bilirubin 0.0 - 1.2 mg/dL 0.4  0.5  0.6   Alkaline Phos 44 - 121 IU/L 93  89  88   AST 0 - 40 IU/L 29  26  25    ALT 0 - 32 IU/L 41  29  33     Lab Results  Component Value Date   CHOL 151 09/22/2023   HDL 47 09/22/2023   LDLCALC 77 09/22/2023   LDLDIRECT 67 09/22/2023   TRIG 156 (H) 09/22/2023   CHOLHDL 3.2 09/22/2023   Recent Labs    12/22/22 0141  LIPOA 42.6*   No components found for: "NTPROBNP" No results for input(s): "PROBNP" in the last 8760 hours. No results for input(s): "TSH" in the last 8760 hours.  LABORATORY DATA: External Labs: Collected: 12/06/2022 provided by referring physician. Total cholesterol 167, triglycerides 219, HDL 46, LDL calculated 85, non-HDL 122   Physical Exam:    Today's Vitals   12/01/23 0948  BP: (!) 120/58  Pulse: (!) 57  Resp: 16  SpO2: 97%  Weight: 156 lb 12.8 oz (71.1 kg)  Height: 5\' 4"  (1.626 m)   Body mass index is 26.91 kg/m. Wt Readings from Last 3 Encounters:  12/01/23 156 lb 12.8 oz (71.1 kg)  08/10/23 158 lb 12.8 oz (72 kg)  04/25/23 163 lb 6.4 oz (74.1 kg)    Physical Exam  Constitutional: No distress.   Age appropriate, hemodynamically stable.   Neck: No JVD present.  Cardiovascular: Normal rate, regular  rhythm, S1 normal, S2 normal, intact distal pulses and normal pulses. Exam reveals no gallop, no S3 and no S4.  No murmur heard. Pulmonary/Chest: Effort normal and breath sounds normal. No stridor. She has no wheezes. She has no rales.  Abdominal: Soft. Bowel sounds are normal. She exhibits no distension. There is no abdominal tenderness.  Musculoskeletal:        General: No edema.     Cervical back: Neck supple.  Neurological: She is alert and oriented to person, place, and time. She has intact cranial nerves (2-12).  Skin: Skin is warm and moist.     Impression & Recommendation(s):  Impression:   ICD-10-CM   1. Coronary artery disease involving native coronary artery of native heart without angina pectoris  I25.10     2. S/P angioplasty with stent  Z95.820     3. Lower leg edema  R60.0 VAS [place] LOWER EXTREMITY VENOUS (DVT)    4. Atherosclerosis of aorta (HCC)  I70.0     5. Mixed hyperlipidemia  E78.2     6. Pure hypertriglyceridemia  E78.1     7. Benign hypertension  I10 amLODipine (NORVASC) 5 MG tablet        Recommendation(s):  Coronary artery disease involving native coronary artery of native heart without angina pectoris S/P angioplasty with stent No chest pain since last office visit. No use of sublingual nitroglycerin tablets since the last office visit. Presented with unstable angina in March 2024 and underwent coronary intervention -now on dual antiplatelet therapy for 12 months.  After December 21, 2023 patient will discontinue Brilinta and continue aspirin 81 mg p.o. daily Of note, did not tolerate Jardiance well in the past and developed acute kidney injury requiring her to be hospitalized.  Atherosclerosis of aorta (HCC) Continue antiplatelet therapy and statin  Mixed hyperlipidemia Pure hypertriglyceridemia Currently on Lipitor 80 mg p.o. daily and Vascepa  1 g twice daily. Most recent labs from 09/22/2023 independently reviewed-healthy and triglyceride levels are acceptable.  Benign hypertension Office blood pressures are well-controlled Complains of bilateral symmetrical swelling at the ankles-usually improves in the morning. I suspect is secondary to amlodipine.  Well score is low. Recommended reducing amlodipine from 10 mg p.o. every morning to 5 mg p.o. every afternoon. Start taking olmesartan 20 mg p.o. every morning. If the swelling does not improve in the next 48 to 72 hours patient is advised to proceed forward with lower extremity venous duplex to rule out DVT.  Orders placed.  Orders Placed:  No orders of the defined types were placed in this encounter.   Final Medication List:    Meds ordered this encounter  Medications   amLODipine (NORVASC) 5 MG tablet    Sig: Take 1 tablet (5 mg total) by mouth daily.    Dispense:  90 tablet    Refill:  3    Decreasing to 5 mg    Medications Discontinued During This Encounter  Medication Reason   amLODipine (NORVASC) 10 MG tablet Reorder      Current Outpatient Medications:    aspirin EC 81 MG tablet, Take 1 tablet (81 mg total) by mouth daily. Swallow whole., Disp: 90 tablet, Rfl: 3   atorvastatin (LIPITOR) 80 MG tablet, Take 80 mg by mouth daily., Disp: , Rfl:    Cholecalciferol (VITAMIN D-3) 25 MCG (1000 UT) CAPS, Take 1,000 Units by mouth daily., Disp: , Rfl:    icosapent Ethyl (VASCEPA) 1 g capsule, Take 2 capsules (2 g total) by mouth 2 (  two) times daily. (Patient taking differently: Take 1 g by mouth 2 (two) times daily.), Disp: 120 capsule, Rfl: 6   metoprolol succinate (TOPROL XL) 25 MG 24 hr tablet, Take 1 tablet (25 mg total) by mouth every morning. Hold if systolic blood pressure (top number) less than 100 mmHg or pulse less than 60 bpm., Disp: 30 tablet, Rfl: 11   nitroGLYCERIN (NITROSTAT) 0.4 MG SL tablet, Place 1 tablet (0.4 mg total) under the tongue every 5 (five)  minutes as needed for chest pain. If you require more than two tablets five minutes apart go to the nearest ER via EMS., Disp: 30 tablet, Rfl: 0   olmesartan (BENICAR) 20 MG tablet, Take 0.5 tablets (10 mg total) by mouth daily at 10 pm. (Patient taking differently: Take 20 mg by mouth daily at 10 pm.), Disp: 45 tablet, Rfl: 0   ticagrelor (BRILINTA) 90 MG TABS tablet, Take 1 tablet (90 mg total) by mouth 2 (two) times daily., Disp: 180 tablet, Rfl: 3   amLODipine (NORVASC) 5 MG tablet, Take 1 tablet (5 mg total) by mouth daily., Disp: 90 tablet, Rfl: 3  Consent:   NA  Disposition:   6 months sooner if needed.  Her questions and concerns were addressed to her satisfaction. She voices understanding of the recommendations provided during this encounter.    Signed, Tessa Lerner, DO, [facility]  [facility]  [address] [place], [place] [address]

## 2023-12-01 NOTE — Patient Instructions (Addendum)
 Medication Instructions:  Your physician has recommended you make the following change in your medication:   DECREASE Amlodipine to 5 mg once daily in the evening  Take Olmesartan 20 mg once daily in the morning   *If you need a refill on your cardiac medications before your next appointment, please call your pharmacy*  Lab Work: None ordered today. If you have labs (blood work) drawn today and your tests are completely normal, you will receive your results only by: MyChart Message (if you have MyChart) OR A paper copy in the mail If you have any lab test that is abnormal or we need to change your treatment, we will call you to review the results.  Testing/Procedures: Your physician has requested that you have a lower extremity venous duplex. This test is an ultrasound of the veins in the legs or arms. It looks at venous blood flow that carries blood from the heart to the legs or arms. Allow one hour for a Lower Venous exam. Allow thirty minutes for an Upper Venous exam. There are no restrictions or special instructions.  Please note: We ask at that you not bring children with you during ultrasound (echo/ vascular) testing. Due to room size and safety concerns, children are not allowed in the ultrasound rooms during exams. Our front office staff cannot provide observation of children in our lobby area while testing is being conducted. An adult accompanying a patient to their appointment will only be allowed in the ultrasound room at the discretion of the ultrasound technician under special circumstances. We apologize for any inconvenience.   Follow-Up: At [facility], you and your health needs are our priority.  As part of our continuing mission to provide you with exceptional heart care, we have created designated Provider Care Teams.  These Care Teams include your primary Cardiologist (physician) and Advanced Practice Providers (APPs -  Physician Assistants and Nurse Practitioners) who  all work together to provide you with the care you need, when you need it.   Your next appointment:   6 month(s)  The format for your next appointment:   In Person  Provider:   Tessa Lerner, DO {

## 2023-12-04 ENCOUNTER — Encounter: Payer: Self-pay | Admitting: Cardiology

## 2023-12-07 DIAGNOSIS — R7303 Prediabetes: Secondary | ICD-10-CM | POA: Diagnosis not present

## 2023-12-07 DIAGNOSIS — I10 Essential (primary) hypertension: Secondary | ICD-10-CM | POA: Diagnosis not present

## 2023-12-07 DIAGNOSIS — N1831 Chronic kidney disease, stage 3a: Secondary | ICD-10-CM | POA: Diagnosis not present

## 2023-12-07 DIAGNOSIS — E782 Mixed hyperlipidemia: Secondary | ICD-10-CM | POA: Diagnosis not present

## 2023-12-07 DIAGNOSIS — M8580 Other specified disorders of bone density and structure, unspecified site: Secondary | ICD-10-CM | POA: Diagnosis not present

## 2023-12-07 DIAGNOSIS — E559 Vitamin D deficiency, unspecified: Secondary | ICD-10-CM | POA: Diagnosis not present

## 2023-12-07 DIAGNOSIS — I700 Atherosclerosis of aorta: Secondary | ICD-10-CM | POA: Diagnosis not present

## 2023-12-07 DIAGNOSIS — Z955 Presence of coronary angioplasty implant and graft: Secondary | ICD-10-CM | POA: Diagnosis not present

## 2023-12-07 DIAGNOSIS — I2510 Atherosclerotic heart disease of native coronary artery without angina pectoris: Secondary | ICD-10-CM | POA: Diagnosis not present

## 2023-12-07 DIAGNOSIS — Z0000 Encounter for general adult medical examination without abnormal findings: Secondary | ICD-10-CM | POA: Diagnosis not present

## 2023-12-07 DIAGNOSIS — Z1211 Encounter for screening for malignant neoplasm of colon: Secondary | ICD-10-CM | POA: Diagnosis not present

## 2023-12-07 DIAGNOSIS — Z23 Encounter for immunization: Secondary | ICD-10-CM | POA: Diagnosis not present

## 2023-12-21 ENCOUNTER — Ambulatory Visit (HOSPITAL_COMMUNITY)
Admission: RE | Admit: 2023-12-21 | Discharge: 2023-12-21 | Disposition: A | Discharge status: Home / Self Care | Payer: Medicare Other | Source: Ambulatory Visit | Attending: Cardiology | Admitting: Cardiology

## 2023-12-21 DIAGNOSIS — R600 Localized edema: Secondary | ICD-10-CM | POA: Diagnosis not present

## 2023-12-21 DIAGNOSIS — R945 Abnormal results of liver function studies: Secondary | ICD-10-CM | POA: Diagnosis not present

## 2023-12-22 DIAGNOSIS — Z1211 Encounter for screening for malignant neoplasm of colon: Secondary | ICD-10-CM | POA: Diagnosis not present

## 2023-12-28 ENCOUNTER — Encounter: Payer: Self-pay | Admitting: Cardiology

## 2023-12-30 ENCOUNTER — Telehealth: Payer: Self-pay

## 2023-12-30 NOTE — Telephone Encounter (Signed)
   Pre-operative Risk Assessment    Patient Name: Amy Kerr  DOB: 1951-08-17 MRN: [id number]   Date of last office visit: 12/01/23 S. Odis Hollingshead, DO Date of next office visit: Na   Request for Surgical Clearance    Procedure:   Colonoscopy  Date of Surgery:  Clearance 02/06/24                                 Surgeon:  Dr. Kerin Salen Surgeon's Group or Practice Name:  [facility] Phone number:  [phone] Fax number:  [phone]   Type of Clearance Requested:   - Medical  - Pharmacy:  Hold Ticagrelor (Brilinta) 90mg  - 5 days prior to procedure   Type of Anesthesia:   Propofol   Additional requests/questions:    Elyse Jarvis   12/30/2023, 4:59 PM

## 2024-01-02 NOTE — Telephone Encounter (Signed)
   Patient Name: Amy Kerr  DOB: 03-Apr-1951 MRN: [id number]  Primary Cardiologist: Bhargav Barbaro Lerner, DO  Chart reviewed as part of pre-operative protocol coverage. Pre-op clearance already addressed by colleagues in earlier phone notes. To summarize recommendations:  -She was seen a month ago by Dr. Odis Hollingshead and was doing well at that time. No chest pains. If no new CV symptoms, would be at acceptable risk to move forward with colonoscopy.  Okay to hold Brilinta x 5 days prior to procedure.  In the interim, would prefer starting aspirin 81 mg daily.  Please resume Brilinta alone following colonoscopy as it is medically safe to do so.  Will route this bundled recommendation to requesting provider via Epic fax function and remove from pre-op pool. Please call with questions.  Sharlene Dory, PA-C 01/02/2024, 7:52 AM

## 2024-01-14 ENCOUNTER — Other Ambulatory Visit: Payer: Self-pay | Admitting: Cardiology

## 2024-01-14 DIAGNOSIS — I2510 Atherosclerotic heart disease of native coronary artery without angina pectoris: Secondary | ICD-10-CM

## 2024-01-14 DIAGNOSIS — Z95820 Peripheral vascular angioplasty status with implants and grafts: Secondary | ICD-10-CM

## 2024-02-06 DIAGNOSIS — D3610 Benign neoplasm of peripheral nerves and autonomic nervous system, unspecified: Secondary | ICD-10-CM | POA: Diagnosis not present

## 2024-02-06 DIAGNOSIS — K5730 Diverticulosis of large intestine without perforation or abscess without bleeding: Secondary | ICD-10-CM | POA: Diagnosis not present

## 2024-02-06 DIAGNOSIS — Z1211 Encounter for screening for malignant neoplasm of colon: Secondary | ICD-10-CM | POA: Diagnosis not present

## 2024-02-08 DIAGNOSIS — D3610 Benign neoplasm of peripheral nerves and autonomic nervous system, unspecified: Secondary | ICD-10-CM | POA: Diagnosis not present

## 2024-02-22 DIAGNOSIS — R600 Localized edema: Secondary | ICD-10-CM | POA: Diagnosis not present

## 2024-02-22 DIAGNOSIS — E782 Mixed hyperlipidemia: Secondary | ICD-10-CM | POA: Diagnosis not present

## 2024-02-22 DIAGNOSIS — E8809 Other disorders of plasma-protein metabolism, not elsewhere classified: Secondary | ICD-10-CM | POA: Diagnosis not present

## 2024-03-01 DIAGNOSIS — L820 Inflamed seborrheic keratosis: Secondary | ICD-10-CM | POA: Diagnosis not present

## 2024-03-01 DIAGNOSIS — D0371 Melanoma in situ of right lower limb, including hip: Secondary | ICD-10-CM | POA: Diagnosis not present

## 2024-03-01 DIAGNOSIS — D485 Neoplasm of uncertain behavior of skin: Secondary | ICD-10-CM | POA: Diagnosis not present

## 2024-03-22 DIAGNOSIS — D0371 Melanoma in situ of right lower limb, including hip: Secondary | ICD-10-CM | POA: Diagnosis not present

## 2024-04-02 DIAGNOSIS — N1831 Chronic kidney disease, stage 3a: Secondary | ICD-10-CM | POA: Diagnosis not present

## 2024-04-02 DIAGNOSIS — E782 Mixed hyperlipidemia: Secondary | ICD-10-CM | POA: Diagnosis not present

## 2024-04-02 DIAGNOSIS — I2510 Atherosclerotic heart disease of native coronary artery without angina pectoris: Secondary | ICD-10-CM | POA: Diagnosis not present

## 2024-04-02 DIAGNOSIS — I10 Essential (primary) hypertension: Secondary | ICD-10-CM | POA: Diagnosis not present

## 2024-04-04 DIAGNOSIS — R770 Abnormality of albumin: Secondary | ICD-10-CM | POA: Diagnosis not present

## 2024-04-04 DIAGNOSIS — I10 Essential (primary) hypertension: Secondary | ICD-10-CM | POA: Diagnosis not present

## 2024-04-04 DIAGNOSIS — R600 Localized edema: Secondary | ICD-10-CM | POA: Diagnosis not present

## 2024-04-05 ENCOUNTER — Telehealth: Payer: Self-pay | Admitting: Cardiology

## 2024-04-05 MED ORDER — ATORVASTATIN CALCIUM 80 MG PO TABS: 80.0000 mg | ORAL_TABLET | Freq: Every day | ORAL | 2 refills | Status: DC

## 2024-04-05 NOTE — Telephone Encounter (Signed)
 Pt's medication was sent to pt's pharmacy as requested. Confirmation received.

## 2024-04-05 NOTE — Telephone Encounter (Signed)
*  STAT* If patient is at the pharmacy, call can be transferred to refill team.   1. Which medications need to be refilled? (please list name of each medication and dose if known)  atorvastatin (LIPITOR) 80 MG tablet  2. Which pharmacy/location (including street and city if local pharmacy) is medication to be sent to? [id number] - [place], [place] - [address]  3. Do they need a 30 day or 90 day supply?   90 day supply

## 2024-04-10 ENCOUNTER — Ambulatory Visit: Admitting: Cardiology

## 2024-04-23 DIAGNOSIS — I10 Essential (primary) hypertension: Secondary | ICD-10-CM | POA: Diagnosis not present

## 2024-04-23 DIAGNOSIS — M549 Dorsalgia, unspecified: Secondary | ICD-10-CM | POA: Diagnosis not present

## 2024-04-25 ENCOUNTER — Other Ambulatory Visit (HOSPITAL_COMMUNITY): Payer: Self-pay | Admitting: Internal Medicine

## 2024-04-25 ENCOUNTER — Ambulatory Visit (HOSPITAL_COMMUNITY)

## 2024-04-25 DIAGNOSIS — R1011 Right upper quadrant pain: Secondary | ICD-10-CM

## 2024-04-26 ENCOUNTER — Ambulatory Visit (HOSPITAL_COMMUNITY)
Admission: RE | Admit: 2024-04-26 | Discharge: 2024-04-26 | Disposition: A | Discharge status: Home / Self Care | Source: Ambulatory Visit | Attending: Internal Medicine | Admitting: Internal Medicine

## 2024-04-26 DIAGNOSIS — R1011 Right upper quadrant pain: Secondary | ICD-10-CM | POA: Diagnosis not present

## 2024-04-26 DIAGNOSIS — Z9049 Acquired absence of other specified parts of digestive tract: Secondary | ICD-10-CM | POA: Diagnosis not present

## 2024-05-03 ENCOUNTER — Ambulatory Visit: Attending: Cardiology | Admitting: Cardiology

## 2024-05-03 ENCOUNTER — Other Ambulatory Visit (HOSPITAL_COMMUNITY): Payer: Self-pay

## 2024-05-03 ENCOUNTER — Encounter: Payer: Self-pay | Admitting: Cardiology

## 2024-05-03 VITALS — BP 138/80 | HR 64 | Resp 16 | Ht 64.0 in | Wt 165.7 lb

## 2024-05-03 DIAGNOSIS — E782 Mixed hyperlipidemia: Secondary | ICD-10-CM

## 2024-05-03 DIAGNOSIS — N1831 Chronic kidney disease, stage 3a: Secondary | ICD-10-CM | POA: Diagnosis not present

## 2024-05-03 DIAGNOSIS — I10 Essential (primary) hypertension: Secondary | ICD-10-CM

## 2024-05-03 DIAGNOSIS — I2510 Atherosclerotic heart disease of native coronary artery without angina pectoris: Secondary | ICD-10-CM | POA: Diagnosis not present

## 2024-05-03 DIAGNOSIS — R600 Localized edema: Secondary | ICD-10-CM | POA: Diagnosis not present

## 2024-05-03 DIAGNOSIS — E781 Pure hyperglyceridemia: Secondary | ICD-10-CM

## 2024-05-03 DIAGNOSIS — I700 Atherosclerosis of aorta: Secondary | ICD-10-CM | POA: Diagnosis not present

## 2024-05-03 DIAGNOSIS — I200 Unstable angina: Secondary | ICD-10-CM

## 2024-05-03 DIAGNOSIS — Z95820 Peripheral vascular angioplasty status with implants and grafts: Secondary | ICD-10-CM

## 2024-05-03 MED ORDER — ATORVASTATIN CALCIUM 40 MG PO TABS
40.0000 mg | ORAL_TABLET | Freq: Every day | ORAL | 3 refills | Status: AC
  Filled 2024-05-03: qty 90, 90d supply, fill #0
  Filled 2024-07-26: qty 90, 90d supply, fill #1
  Filled 2024-10-31: qty 90, 90d supply, fill #2

## 2024-05-03 MED ORDER — NITROGLYCERIN 0.4 MG SL SUBL
0.4000 mg | SUBLINGUAL_TABLET | SUBLINGUAL | 3 refills | Status: AC | PRN
  Filled 2024-05-03: qty 25, 25d supply, fill #0

## 2024-05-03 NOTE — Progress Notes (Signed)
 Cardiology Office Note:  .    ID:  Amy Kerr, DOB Aug 27, 1951, MRN [id number] PCP:  Ransom Other, MD  Former Cardiology Providers: None [facility] Providers Cardiologist:  Madonna Large, DO , [facility] (established care March 2024) Electrophysiologist:  None  Click to update primary MD,subspecialty MD or APP then REFRESH:1}    Chief Complaint  Patient presents with   Coronary artery disease involving native coronary artery of    History of Present Illness: .   Amy Kerr is a 73 y.o. Caucasian female whose past medical history and cardiovascular risk factors includes: History of unstable angina 12/2022 status post PCI to OM1, hypertension, hyperlipidemia, hypertriglyceridemia, GERD, vitamin D deficiency, atherosclerosis of the aorta.   In March 2024 patient presented to the office with symptoms concerning for unstable angina.  She underwent heart catheterization which was noted to have obstructive disease underwent coronary intervention. Since then we have been focusing on improving her modifiable cardiovascular risk factors including lipid and triglyceride management.  She is unable to tolerate higher dose of atorvastatin  and also unable to tolerate fenofibrate  in the past.  In addition, when she was started on Jardiance  she developed acute kidney injury and needed to be hospitalized.    Over the last 6 months patient is doing well from a cardiovascular standpoint.  She denies anginal chest pain or heart failure symptoms.  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  She at least walks 1 to 2 hours/day without any cardiovascular symptoms.  Patient states that she has been having right upper quadrant pain thinking Lipitor was contributing to it.  She was initially on Lipitor 80 mg p.o. daily and reduced it to 40 mg p.o. daily and stated that the symptoms did improve.  She also had blood work and imaging with PCP.  She is requesting down titration of  lipid-lowering agents.  Review of Systems: .   Review of Systems  Cardiovascular:  Negative for chest pain, claudication, irregular heartbeat, leg swelling, near-syncope, orthopnea, palpitations, paroxysmal nocturnal dyspnea and syncope.  Respiratory:  Negative for shortness of breath.   Hematologic/Lymphatic: Negative for bleeding problem.    Studies Reviewed:    Echocardiogram: 12/23/2022:  Normal LV systolic function with visual EF 55-60%. Left ventricle cavity  is normal in size. Normal left ventricular wall thickness. Normal global  wall motion. Indeterminate diastolic filling pattern. Calculated EF 60%.  Mild (Grade I) mitral regurgitation. Mild calcification of the mitral  valve annulus. Mild mitral valve leaflet thickening.  Structurally normal tricuspid valve with no regurgitation. No evidence of  pulmonary hypertension.  No prior available for comparison.   Heart Catheterization: 12/21/2022 LM: Normal LAD: Mid 30% disease         High diag 1 with mild luminal irregularities Lcx: Prox OM1 with focal 95% stenosis RCA: Mid tandem 30% and 40% stenoses   LVEDP normal LVEF normal   Presentation with unstable angina Successful percutaneous coronary intervention prox OM1        PTCA and stent placement 3.0 X 16 mm Synergy drug-eluting stent        Post dilatation with 3.0X12 mm Seltzer balloon up to 20 atm Medical management for rest of the nonobstructive disease       RADIOLOGY: NA  Risk Assessment/Calculations:   NA   Labs:       Latest Ref Rng & Units 01/03/2023    1:41 AM 01/02/2023    8:07 AM 12/22/2022  1:41 AM  CBC  WBC 4.0 - 10.5 K/uL 6.2  7.2  8.7   Hemoglobin 12.0 - 15.0 g/dL 87.2  84.5  86.6   Hematocrit 36.0 - 46.0 % 37.7  43.7  38.6   Platelets 150 - 400 K/uL 210  271  251        Latest Ref Rng & Units 09/22/2023    8:09 AM 08/05/2023    9:27 AM 05/04/2023    9:13 AM  BMP  Glucose 70 - 99 mg/dL 898  896  89   BUN 8 - 27 mg/dL 14  17  16     Creatinine 0.57 - 1.00 mg/dL 9.06  8.95  8.82   BUN/Creat Ratio 12 - 28 15  16  14    Sodium 134 - 144 mmol/L 142  142  142   Potassium 3.5 - 5.2 mmol/L 4.5  4.6  5.1   Chloride 96 - 106 mmol/L 109  110  109   CO2 20 - 29 mmol/L 23  24  20    Calcium  8.7 - 10.3 mg/dL 8.6  8.7  8.9       Latest Ref Rng & Units 09/22/2023    8:09 AM 08/05/2023    9:27 AM 05/04/2023    9:13 AM  CMP  Glucose 70 - 99 mg/dL 898  896  89   BUN 8 - 27 mg/dL 14  17  16    Creatinine 0.57 - 1.00 mg/dL 9.06  8.95  8.82   Sodium 134 - 144 mmol/L 142  142  142   Potassium 3.5 - 5.2 mmol/L 4.5  4.6  5.1   Chloride 96 - 106 mmol/L 109  110  109   CO2 20 - 29 mmol/L 23  24  20    Calcium  8.7 - 10.3 mg/dL 8.6  8.7  8.9   Total Protein 6.0 - 8.5 g/dL 5.8  6.0  6.4   Total Bilirubin 0.0 - 1.2 mg/dL 0.4  0.5  0.6   Alkaline Phos 44 - 121 IU/L 93  89  88   AST 0 - 40 IU/L 29  26  25    ALT 0 - 32 IU/L 41  29  33     Lab Results  Component Value Date   CHOL 151 09/22/2023   HDL 47 09/22/2023   LDLCALC 77 09/22/2023   LDLDIRECT 67 09/22/2023   TRIG 156 (H) 09/22/2023   CHOLHDL 3.2 09/22/2023   No results for input(s): LIPOA in the last 8760 hours.  No components found for: NTPROBNP No results for input(s): PROBNP in the last 8760 hours. No results for input(s): TSH in the last 8760 hours.  LABORATORY DATA: External Labs: Collected: 12/06/2022 provided by referring physician. Total cholesterol 167, triglycerides 219, HDL 46, LDL calculated 85, non-HDL 122  External Labs:  Collected 09/22/2023 Total cholesterol 151, triglycerides 156, HDL 47, LDL calculated 77 CBC with Diff Reviewed date:04/25/2024 04:24:18 PM Interpretation: Performing Lab: Notes/Report: Testing Performed at: [facility], [address], [address], [place], [place] [address]  WBC 4.9 4.0-11.0 K/ul    RBC 4.35 4.20-5.40 M/uL    HGB 13.0 12.0-16.0 g/dL    HCT 61.7 62.9-52.9 %    MCV 87.7 81.0-99.0 fL    MCH 29.9 27.0-33.0 pg     MCHC 34.1 32.0-36.0 g/dL    RDW 86.1 88.4-84.4 %    PLT 220 150-400 K/uL    MPV 7.3 7.5-10.7 fL    NE% 62.1 43.3-71.9 %  LY% 20.7 16.8-43.5 %    MO% 12.5 4.6-12.4 %    EO% 4.1 0.0-7.8 %    BA% 0.6 0.0-1.0 %    NE# 3.0 1.9-7.2 K/uL    LY# 1.00 1.10-2.70 K/uL    MO# 0.6 0.3-0.8 K/uL    EO# 0.2 0.0-0.6 K/uL    BA# 0.0 0.0-0.1 K/uL    NRBC% 0.30      NRBC# 0.02      Urine Dip w/reflex to micro if positive Reviewed date:04/24/2024 12:13:40 PM Interpretation: Performing Lab: Notes/Report: Testing Performed at: [facility], [address], [address], [place], [place] [address]  USG 1.020 1.010-1.030    UPH 7.0 5.0-8.0    UGLU NEGATIVE Negative mg/dL    UBILI NEGATIVE Negative    UKET NEGATIVE Negative mg/dL    UBLD 1+ Negative ERY/UL    UPRO 3+ Negative mg/dL    UNIT NEGATIVE Negative    ULEUKEST NEGATIVE Negative    UURO 0.2 0.0-1.0 mg/dL    Culture if Positive (Urinalysis) Reviewed date:04/24/2024 12:13:40 PM Interpretation: Performing Lab: Notes/Report: Testing Performed at: [facility], [address], [place], [place] [address]  CIFP See Culture Report from [facility]      Comp Metabolic Panel Reviewed date:12/21/2023 12:10:46 PM Interpretation: Performing Lab: Notes/Report: Testing Performed at: [facility], [address], [address], [place], [place] [address]  Glucose 73 70-99 mg/dL    BUN 22 3-73 mg/dL    Creatinine 8.95 9.39-8.69 mg/dl    [id number] 57 >39 calc In accordance with recommendations from NKF-ASN Task Force, [facility] has updated its eGFR calc to the 2021 CKD-EDI equation that estimates kidney function without a race variable;Stage 1 > 90 ML/Min plus Albuminuria;Stage 2 60-89 ML/MIN;Stage 3 30-59 ML/MIN;Stage 4 15-29 ML/MIN;Stage 5 <15 ML/MIN  Sodium 141 136-145 mmol/L    Potassium 4.7 3.5-5.5 mmol/L    Chloride 107 98-107 mmol/L    CO2 29 22-32 mmol/L    Anion Gap 9.6 6.0-20.0 mmol/L    Calcium  8.1 8.6-10.3 mg/dL    CA-corrected 0.81  1.39-89.69 mg/dL    Protein, Total 5.3 3.9-1.6 g/dL    Albumin 2.7 6.5-5.1 g/dL    TBIL 0.4 9.6-8.9 mg/dL    ALP 841 61-873 U/L    AST 28 0-39 U/L    ALT 31 0-52 U/L    Hemoglobin A1c Reviewed date:12/07/2023 01:02:53 PM Interpretation: Performing Lab: Notes/Report: Testing Performed at: [facility], [address], [place], [place] [address]  eAG 111      Hgb A1c 5.5 4.8-5.6 % Prediabetes: 5.7-6.4%  Diabetes: >/= 6.5%      Physical Exam:    Today's Vitals   05/03/24 0757  BP: 138/80  Pulse: 64  Resp: 16  SpO2: 98%  Weight: 165 lb 11.2 oz (75.2 kg)  Height: 5' 4 (1.626 m)   Body mass index is 28.44 kg/m. Wt Readings from Last 3 Encounters:  05/03/24 165 lb 11.2 oz (75.2 kg)  12/01/23 156 lb 12.8 oz (71.1 kg)  08/10/23 158 lb 12.8 oz (72 kg)    Physical Exam  Constitutional: No distress.  Age appropriate, hemodynamically stable.   Neck: No JVD present.  Cardiovascular: Normal rate, regular rhythm, S1 normal, S2 normal, intact distal pulses and normal pulses. Exam reveals no gallop, no S3 and no S4.  No murmur heard. Pulmonary/Chest: Effort normal and breath sounds normal. No stridor. She has no wheezes. She has no rales.  Abdominal: Soft. Bowel sounds are normal. She exhibits no  distension. There is no abdominal tenderness.  Musculoskeletal:        General: No edema.     Cervical back: Neck supple.  Neurological: She is alert and oriented to person, place, and time. She has intact cranial nerves (2-12).  Skin: Skin is warm and moist.     Impression & Recommendation(s):  Impression:   ICD-10-CM   1. Coronary artery disease involving native coronary artery of native heart without angina pectoris  I25.10 EKG 12-Lead    2. S/P angioplasty with stent  Z95.820     3. Lower leg edema  R60.0     4. Atherosclerosis of aorta (HCC)  I70.0     5. Mixed hyperlipidemia  E78.2     6. Pure hypertriglyceridemia  E78.1     7. Benign hypertension  I10      8. Unstable angina (HCC)  I20.0 nitroGLYCERIN  (NITROSTAT ) 0.4 MG SL tablet        Recommendation(s):  Coronary artery disease involving native coronary artery of native heart without angina pectoris S/P angioplasty with stent Atherosclerosis of aorta (HCC) Denies anginal chest pain or heart failure symptoms. EKG is nonischemic. No use of sublingual nitroglycerin  tablets since the last office visit. Continue aspirin  and lipid-lowering agents. Will reduce Lipitor from 80 mg p.o. daily to 40 mg p.o. daily. She will have fasting lipids done with PCP in about 6 weeks. Outside labs independently reviewed which noted isolated elevation of alkaline phosphatase, I have asked her to follow-up with PCP  Mixed hyperlipidemia Pure hypertriglyceridemia Outside labs from December 2024 independently reviewed at which time the LDL was 77 mg/dL. Given her history would recommend a goal LDL at least <70 mg/dL and if possible around 55 mg/dL. Given her right upper quadrant pain we discussed transitioning off of statins and considering PCSK9 inhibitors. Patient prefers lower dose of Lipitor at this time.  Will reduce Lipitor from 80 to 40 mg p.o. daily as mentioned above. Continue Vascepa  1 g 2 tablets twice daily  Benign hypertension Office and home blood pressures are well-controlled. Patient complains of lower extremity swelling, likely secondary to amlodipine . Given the fact that her home blood pressures are consistently < 125 mmHg it would be reasonable to discontinue amlodipine  instead of starting Lasix.  Will defer to PCP.  Orders Placed:  Orders Placed This Encounter  Procedures   EKG 12-Lead    Final Medication List:    Meds ordered this encounter  Medications   nitroGLYCERIN  (NITROSTAT ) 0.4 MG SL tablet    Sig: Place 1 tablet (0.4 mg total) under the tongue every 5 (five) minutes as needed for chest pain. If you require more than two tablets five minutes apart go to the nearest ER via  EMS.    Dispense:  25 tablet    Refill:  3   atorvastatin  (LIPITOR) 40 MG tablet    Sig: Take 1 tablet (40 mg total) by mouth daily.    Dispense:  90 tablet    Refill:  3    Medications Discontinued During This Encounter  Medication Reason   nitroGLYCERIN  (NITROSTAT ) 0.4 MG SL tablet Reorder   atorvastatin  (LIPITOR) 80 MG tablet Reorder       Current Outpatient Medications:    amLODipine  (NORVASC ) 5 MG tablet, Take 1 tablet (5 mg total) by mouth daily. (Patient taking differently: Take 2.5 mg by mouth at bedtime.), Disp: 90 tablet, Rfl: 3   aspirin  EC 81 MG tablet, Take 81 mg by mouth daily. Swallow whole.,  Disp: , Rfl:    Cholecalciferol (VITAMIN D-3) 25 MCG (1000 UT) CAPS, Take 1,000 Units by mouth daily., Disp: , Rfl:    icosapent  Ethyl (VASCEPA ) 1 g capsule, Take 2 capsules (2 g total) by mouth 2 (two) times daily. (Patient taking differently: Take 1 g by mouth 2 (two) times daily.), Disp: 120 capsule, Rfl: 6   metoprolol  succinate (TOPROL -XL) 25 MG 24 hr tablet, Take 1 tablet (25 mg total) by mouth every morning. Hold if systolic blood pressure (top number) less than 100 mmHg or pulse less than 60 bpm., Disp: 90 tablet, Rfl: 3   olmesartan  (BENICAR ) 20 MG tablet, Take 0.5 tablets (10 mg total) by mouth daily at 10 pm. (Patient taking differently: Take 20 mg by mouth daily at 10 pm.), Disp: 45 tablet, Rfl: 0   atorvastatin  (LIPITOR) 40 MG tablet, Take 1 tablet (40 mg total) by mouth daily., Disp: 90 tablet, Rfl: 3   nitroGLYCERIN  (NITROSTAT ) 0.4 MG SL tablet, Place 1 tablet (0.4 mg total) under the tongue every 5 (five) minutes as needed for chest pain. If you require more than two tablets five minutes apart go to the nearest ER via EMS., Disp: 25 tablet, Rfl: 3  Consent:   NA  Disposition:   12 months sooner if needed.  Her questions and concerns were addressed to her satisfaction. She voices understanding of the recommendations provided during this encounter.     Signed, Madonna Large, DO, [facility]  A Division of [facility] she is [address], [place], [place] [address]  [place],  [id number]

## 2024-05-03 NOTE — Patient Instructions (Addendum)
 Medication Instructions:  Refilled Nitroglycerin    START Atorvastatin  40 mg take one tablet by mouth daily   *If you need a refill on your cardiac medications before your next appointment, please call your pharmacy*  Follow-Up: At [facility], you and your health needs are our priority.  As part of our continuing mission to provide you with exceptional heart care, our providers are all part of one team.  This team includes your primary Cardiologist (physician) and Advanced Practice Providers or APPs (Physician Assistants and Nurse Practitioners) who all work together to provide you with the care you need, when you need it.  Your next appointment:   1 year(s)  Provider:   Madonna Large, DO

## 2024-06-03 DIAGNOSIS — I2510 Atherosclerotic heart disease of native coronary artery without angina pectoris: Secondary | ICD-10-CM | POA: Diagnosis not present

## 2024-06-03 DIAGNOSIS — I10 Essential (primary) hypertension: Secondary | ICD-10-CM | POA: Diagnosis not present

## 2024-06-03 DIAGNOSIS — E782 Mixed hyperlipidemia: Secondary | ICD-10-CM | POA: Diagnosis not present

## 2024-06-03 DIAGNOSIS — N1831 Chronic kidney disease, stage 3a: Secondary | ICD-10-CM | POA: Diagnosis not present

## 2024-06-11 DIAGNOSIS — I10 Essential (primary) hypertension: Secondary | ICD-10-CM | POA: Diagnosis not present

## 2024-06-11 DIAGNOSIS — E782 Mixed hyperlipidemia: Secondary | ICD-10-CM | POA: Diagnosis not present

## 2024-06-11 DIAGNOSIS — M7989 Other specified soft tissue disorders: Secondary | ICD-10-CM | POA: Diagnosis not present

## 2024-06-11 DIAGNOSIS — M519 Unspecified thoracic, thoracolumbar and lumbosacral intervertebral disc disorder: Secondary | ICD-10-CM | POA: Diagnosis not present

## 2024-06-11 DIAGNOSIS — M5442 Lumbago with sciatica, left side: Secondary | ICD-10-CM | POA: Diagnosis not present

## 2024-06-11 DIAGNOSIS — N1831 Chronic kidney disease, stage 3a: Secondary | ICD-10-CM | POA: Diagnosis not present

## 2024-07-03 DIAGNOSIS — E782 Mixed hyperlipidemia: Secondary | ICD-10-CM | POA: Diagnosis not present

## 2024-07-03 DIAGNOSIS — I10 Essential (primary) hypertension: Secondary | ICD-10-CM | POA: Diagnosis not present

## 2024-07-03 DIAGNOSIS — I2510 Atherosclerotic heart disease of native coronary artery without angina pectoris: Secondary | ICD-10-CM | POA: Diagnosis not present

## 2024-07-03 DIAGNOSIS — N1831 Chronic kidney disease, stage 3a: Secondary | ICD-10-CM | POA: Diagnosis not present

## 2024-07-05 DIAGNOSIS — H43813 Vitreous degeneration, bilateral: Secondary | ICD-10-CM | POA: Diagnosis not present

## 2024-07-05 DIAGNOSIS — H04123 Dry eye syndrome of bilateral lacrimal glands: Secondary | ICD-10-CM | POA: Diagnosis not present

## 2024-07-05 DIAGNOSIS — H26491 Other secondary cataract, right eye: Secondary | ICD-10-CM | POA: Diagnosis not present

## 2024-07-05 DIAGNOSIS — H35033 Hypertensive retinopathy, bilateral: Secondary | ICD-10-CM | POA: Diagnosis not present

## 2024-07-26 DIAGNOSIS — M542 Cervicalgia: Secondary | ICD-10-CM | POA: Diagnosis not present

## 2024-07-26 DIAGNOSIS — I10 Essential (primary) hypertension: Secondary | ICD-10-CM | POA: Diagnosis not present

## 2024-07-26 DIAGNOSIS — M47812 Spondylosis without myelopathy or radiculopathy, cervical region: Secondary | ICD-10-CM | POA: Diagnosis not present

## 2024-07-26 DIAGNOSIS — J309 Allergic rhinitis, unspecified: Secondary | ICD-10-CM | POA: Diagnosis not present

## 2024-09-03 ENCOUNTER — Other Ambulatory Visit: Payer: Self-pay | Admitting: Cardiology

## 2024-09-05 ENCOUNTER — Telehealth: Payer: Self-pay | Admitting: Cardiology

## 2024-09-05 ENCOUNTER — Other Ambulatory Visit: Payer: Self-pay | Admitting: Cardiology

## 2024-09-05 NOTE — Telephone Encounter (Signed)
*  STAT* If patient is at the pharmacy, call can be transferred to refill team.   1. Which medications need to be refilled? (please list name of each medication and dose if known) icosapent  Ethyl (VASCEPA ) 1 g capsule    2. Would you like to learn more about the convenience, safety, & potential cost savings by using the [facility]? no    3. Are you open to using the [facility] (Type [facility].  no   4. Which pharmacy/location (including street and city if local pharmacy) is medication to be sent to? [id number] - [place], [place] - [address]    5. Do they need a 30 day or 90 day supply? 90

## 2024-09-06 MED ORDER — ICOSAPENT ETHYL 1 G PO CAPS
2.0000 g | ORAL_CAPSULE | Freq: Two times a day (BID) | ORAL | 2 refills | Status: AC
  Filled 2024-09-10: qty 360, 90d supply, fill #0

## 2024-09-06 NOTE — Telephone Encounter (Signed)
 Refill sent

## 2024-09-10 ENCOUNTER — Telehealth: Payer: Self-pay | Admitting: Cardiology

## 2024-09-10 ENCOUNTER — Other Ambulatory Visit (HOSPITAL_COMMUNITY): Payer: Self-pay

## 2024-09-10 NOTE — Telephone Encounter (Signed)
*  STAT* If patient is at the pharmacy, call can be transferred to refill team.   1. Which medications need to be refilled? (please list name of each medication and dose if known)  icosapent  Ethyl (VASCEPA ) 1 g capsule    2. Would you like to learn more about the convenience, safety, & potential cost savings by using the [facility]?    3. Are you open to using the [facility] ([facility].  ).   4. Which pharmacy/location (including street and city if local pharmacy) is medication to be sent to? [facility] at [facility]   5. Do they need a 30 day or 90 day supply? 90 day  [facility] doesn't have [facility] in [facility]. Patient is out of meds.

## 2024-09-10 NOTE — Telephone Encounter (Signed)
 Looks like someone sent this medication to [facility], 09/06/24. Our Pharmacy downstairs will call and get it and have it filled for pt.
